# Patient Record
Sex: Male | Born: 1970 | Race: White | Hispanic: No | Marital: Married | Smoking: Never smoker
Health system: Southern US, Community
[De-identification: ages and names within clinical notes are randomized; demographics above are authoritative.]

## PROBLEM LIST (undated history)

## (undated) DIAGNOSIS — I639 Cerebral infarction, unspecified: Secondary | ICD-10-CM

## (undated) DIAGNOSIS — F1010 Alcohol abuse, uncomplicated: Secondary | ICD-10-CM

## (undated) DIAGNOSIS — I619 Nontraumatic intracerebral hemorrhage, unspecified: Secondary | ICD-10-CM

## (undated) DIAGNOSIS — R569 Unspecified convulsions: Secondary | ICD-10-CM

## (undated) DIAGNOSIS — F988 Other specified behavioral and emotional disorders with onset usually occurring in childhood and adolescence: Secondary | ICD-10-CM

## (undated) DIAGNOSIS — F32A Depression, unspecified: Secondary | ICD-10-CM

## (undated) DIAGNOSIS — I10 Essential (primary) hypertension: Secondary | ICD-10-CM

## (undated) HISTORY — DX: Depression, unspecified: F32.A

## (undated) HISTORY — DX: Cerebral infarction, unspecified: I63.9

## (undated) HISTORY — DX: Unspecified convulsions: R56.9

## (undated) HISTORY — DX: Other specified behavioral and emotional disorders with onset usually occurring in childhood and adolescence: F98.8

---

## 1990-02-28 HISTORY — PX: OTHER SURGICAL HISTORY: SHX169

## 1994-02-28 HISTORY — PX: OTHER SURGICAL HISTORY: SHX169

## 2003-08-17 ENCOUNTER — Other Ambulatory Visit: Payer: Self-pay

## 2004-06-19 ENCOUNTER — Emergency Department: Payer: Self-pay | Admitting: Unknown Physician Specialty

## 2006-05-05 ENCOUNTER — Encounter: Payer: Self-pay | Admitting: Internal Medicine

## 2006-05-05 ENCOUNTER — Ambulatory Visit: Payer: Self-pay | Admitting: Internal Medicine

## 2006-05-05 DIAGNOSIS — F988 Other specified behavioral and emotional disorders with onset usually occurring in childhood and adolescence: Secondary | ICD-10-CM | POA: Insufficient documentation

## 2006-06-08 ENCOUNTER — Encounter: Payer: Self-pay | Admitting: Internal Medicine

## 2006-06-08 ENCOUNTER — Ambulatory Visit: Payer: Self-pay | Admitting: Internal Medicine

## 2006-07-03 ENCOUNTER — Telehealth (INDEPENDENT_AMBULATORY_CARE_PROVIDER_SITE_OTHER): Payer: Self-pay | Admitting: *Deleted

## 2006-07-07 ENCOUNTER — Telehealth (INDEPENDENT_AMBULATORY_CARE_PROVIDER_SITE_OTHER): Payer: Self-pay | Admitting: *Deleted

## 2006-08-02 ENCOUNTER — Telehealth (INDEPENDENT_AMBULATORY_CARE_PROVIDER_SITE_OTHER): Payer: Self-pay | Admitting: *Deleted

## 2006-08-28 ENCOUNTER — Telehealth (INDEPENDENT_AMBULATORY_CARE_PROVIDER_SITE_OTHER): Payer: Self-pay | Admitting: *Deleted

## 2006-10-02 ENCOUNTER — Telehealth (INDEPENDENT_AMBULATORY_CARE_PROVIDER_SITE_OTHER): Payer: Self-pay | Admitting: *Deleted

## 2006-10-31 ENCOUNTER — Telehealth (INDEPENDENT_AMBULATORY_CARE_PROVIDER_SITE_OTHER): Payer: Self-pay | Admitting: *Deleted

## 2006-11-28 ENCOUNTER — Telehealth (INDEPENDENT_AMBULATORY_CARE_PROVIDER_SITE_OTHER): Payer: Self-pay | Admitting: *Deleted

## 2006-12-28 ENCOUNTER — Telehealth (INDEPENDENT_AMBULATORY_CARE_PROVIDER_SITE_OTHER): Payer: Self-pay | Admitting: *Deleted

## 2006-12-29 ENCOUNTER — Telehealth (INDEPENDENT_AMBULATORY_CARE_PROVIDER_SITE_OTHER): Payer: Self-pay | Admitting: *Deleted

## 2007-02-12 ENCOUNTER — Telehealth (INDEPENDENT_AMBULATORY_CARE_PROVIDER_SITE_OTHER): Payer: Self-pay | Admitting: *Deleted

## 2007-03-16 ENCOUNTER — Telehealth (INDEPENDENT_AMBULATORY_CARE_PROVIDER_SITE_OTHER): Payer: Self-pay | Admitting: *Deleted

## 2007-04-19 ENCOUNTER — Telehealth (INDEPENDENT_AMBULATORY_CARE_PROVIDER_SITE_OTHER): Payer: Self-pay | Admitting: *Deleted

## 2007-05-02 ENCOUNTER — Encounter (INDEPENDENT_AMBULATORY_CARE_PROVIDER_SITE_OTHER): Payer: Self-pay | Admitting: *Deleted

## 2007-05-15 ENCOUNTER — Ambulatory Visit: Payer: Self-pay | Admitting: Internal Medicine

## 2007-06-18 ENCOUNTER — Telehealth (INDEPENDENT_AMBULATORY_CARE_PROVIDER_SITE_OTHER): Payer: Self-pay | Admitting: *Deleted

## 2007-07-16 ENCOUNTER — Telehealth (INDEPENDENT_AMBULATORY_CARE_PROVIDER_SITE_OTHER): Payer: Self-pay | Admitting: *Deleted

## 2007-08-13 ENCOUNTER — Telehealth (INDEPENDENT_AMBULATORY_CARE_PROVIDER_SITE_OTHER): Payer: Self-pay | Admitting: *Deleted

## 2007-08-20 ENCOUNTER — Telehealth: Payer: Self-pay | Admitting: Internal Medicine

## 2007-09-10 ENCOUNTER — Telehealth (INDEPENDENT_AMBULATORY_CARE_PROVIDER_SITE_OTHER): Payer: Self-pay | Admitting: *Deleted

## 2007-10-11 ENCOUNTER — Telehealth (INDEPENDENT_AMBULATORY_CARE_PROVIDER_SITE_OTHER): Payer: Self-pay | Admitting: *Deleted

## 2007-11-12 ENCOUNTER — Telehealth: Payer: Self-pay | Admitting: Internal Medicine

## 2007-11-16 ENCOUNTER — Ambulatory Visit: Payer: Self-pay | Admitting: Internal Medicine

## 2007-12-12 ENCOUNTER — Telehealth: Payer: Self-pay | Admitting: Internal Medicine

## 2008-01-09 ENCOUNTER — Telehealth: Payer: Self-pay | Admitting: Internal Medicine

## 2008-02-11 ENCOUNTER — Telehealth: Payer: Self-pay | Admitting: Internal Medicine

## 2008-03-11 ENCOUNTER — Telehealth: Payer: Self-pay | Admitting: Internal Medicine

## 2008-04-14 ENCOUNTER — Telehealth: Payer: Self-pay | Admitting: Internal Medicine

## 2008-05-12 ENCOUNTER — Telehealth: Payer: Self-pay | Admitting: Family Medicine

## 2008-07-03 ENCOUNTER — Ambulatory Visit: Payer: Self-pay | Admitting: Internal Medicine

## 2008-07-17 ENCOUNTER — Ambulatory Visit: Payer: Self-pay | Admitting: Family Medicine

## 2008-07-17 ENCOUNTER — Telehealth (INDEPENDENT_AMBULATORY_CARE_PROVIDER_SITE_OTHER): Payer: Self-pay | Admitting: Internal Medicine

## 2008-07-17 DIAGNOSIS — M545 Low back pain: Secondary | ICD-10-CM

## 2008-07-17 LAB — CONVERTED CEMR LAB
Bacteria, UA: 0
Blood in Urine, dipstick: NEGATIVE
Ketones, urine, test strip: NEGATIVE
Nitrite: NEGATIVE
RBC / HPF: 0
Urobilinogen, UA: 0.2
WBC Urine, dipstick: NEGATIVE

## 2008-07-18 ENCOUNTER — Telehealth: Payer: Self-pay | Admitting: Family Medicine

## 2008-07-22 ENCOUNTER — Encounter (INDEPENDENT_AMBULATORY_CARE_PROVIDER_SITE_OTHER): Payer: Self-pay | Admitting: Internal Medicine

## 2008-07-25 ENCOUNTER — Telehealth (INDEPENDENT_AMBULATORY_CARE_PROVIDER_SITE_OTHER): Payer: Self-pay | Admitting: *Deleted

## 2008-08-11 ENCOUNTER — Telehealth: Payer: Self-pay | Admitting: Internal Medicine

## 2008-08-12 ENCOUNTER — Telehealth: Payer: Self-pay | Admitting: *Deleted

## 2008-09-09 ENCOUNTER — Telehealth: Payer: Self-pay | Admitting: Internal Medicine

## 2008-10-14 ENCOUNTER — Telehealth: Payer: Self-pay | Admitting: Internal Medicine

## 2008-11-19 ENCOUNTER — Telehealth: Payer: Self-pay | Admitting: Internal Medicine

## 2008-12-08 ENCOUNTER — Telehealth: Payer: Self-pay | Admitting: Family Medicine

## 2008-12-19 ENCOUNTER — Telehealth: Payer: Self-pay | Admitting: Internal Medicine

## 2009-01-12 ENCOUNTER — Telehealth: Payer: Self-pay | Admitting: Internal Medicine

## 2009-02-13 ENCOUNTER — Telehealth: Payer: Self-pay | Admitting: Internal Medicine

## 2009-03-31 ENCOUNTER — Telehealth: Payer: Self-pay | Admitting: Internal Medicine

## 2009-09-16 ENCOUNTER — Ambulatory Visit: Payer: Self-pay | Admitting: Internal Medicine

## 2009-10-16 ENCOUNTER — Telehealth: Payer: Self-pay | Admitting: Internal Medicine

## 2009-10-19 ENCOUNTER — Telehealth: Payer: Self-pay | Admitting: Internal Medicine

## 2009-11-25 ENCOUNTER — Telehealth: Payer: Self-pay | Admitting: Internal Medicine

## 2009-12-24 ENCOUNTER — Telehealth: Payer: Self-pay | Admitting: Internal Medicine

## 2009-12-29 ENCOUNTER — Telehealth: Payer: Self-pay | Admitting: Internal Medicine

## 2009-12-31 ENCOUNTER — Telehealth: Payer: Self-pay | Admitting: Internal Medicine

## 2010-02-01 ENCOUNTER — Telehealth: Payer: Self-pay | Admitting: Internal Medicine

## 2010-03-26 ENCOUNTER — Ambulatory Visit: Admit: 2010-03-26 | Payer: Self-pay | Admitting: Internal Medicine

## 2010-03-30 NOTE — Progress Notes (Signed)
Summary: adderall   Phone Note Call from Patient Call back at Home Phone [phone]   Caller: Spouse Call For: Cindee Salt MD Summary of Call: Wife tool patient's adderall rx to pharmacy and was told that it is on back order until january. Wife is asking if he can be switched to extended release. Please call patient when ready for pick up.  Initial call taken by: Melody Comas,  December 31, 2009 11:06 AM  Follow-up for Phone Call        Rx written for long acting he should not take this after lunch  they need to bring back the other script Follow-up by: Cindee Salt MD,  December 31, 2009 1:15 PM  Additional Follow-up for Phone Call Additional follow up Details #1::        Spoke with patient's wife and advised results. She will bring back old rx to be destroyed. Additional Follow-up by: Mervin Hack CMA Duncan Dull),  December 31, 2009 3:06 PM    New/Updated Medications: AMPHETAMINE-DEXTROAMPHETAMINE 20 MG XR24H-CAP (AMPHETAMINE-DEXTROAMPHETAMINE) 1-2 tabs daily for attention problems Prescriptions: AMPHETAMINE-DEXTROAMPHETAMINE 20 MG XR24H-CAP (AMPHETAMINE-DEXTROAMPHETAMINE) 1-2 tabs daily for attention problems  #60 x 0   Entered and Authorized by:   Cindee Salt MD   Signed by:   Cindee Salt MD on 12/31/2009   Method used:   Print then Give to Patient   RxID:   [id number]

## 2010-03-30 NOTE — Progress Notes (Signed)
Summary: refill request for adderall  Phone Note Refill Request Call back at Home Phone [phone] Message from:  wife  Refills Requested: Medication #1:  ADDERALL 10 MG  TABS 1 in AM and 1 at lunch as needed Please call when ready.   Initial call taken by: Lowella Petties CMA,  October 16, 2009 1:50 PM  Follow-up for Phone Call        Rx written Follow-up by: Cindee Salt MD,  October 16, 2009 2:11 PM  Additional Follow-up for Phone Call Additional follow up Details #1::        left message on machine that rx ready for pick-up  Additional Follow-up by: Mervin Hack CMA Duncan Dull),  October 16, 2009 2:15 PM    Prescriptions: ADDERALL 10 MG  TABS (AMPHETAMINE-DEXTROAMPHETAMINE) 1 in AM and 1 at lunch as needed  #60 x 0   Entered and Authorized by:   Cindee Salt MD   Signed by:   Cindee Salt MD on 10/16/2009   Method used:   Print then Give to Patient   RxID:   [id number]

## 2010-03-30 NOTE — Progress Notes (Signed)
Summary: Rx Adderall  Phone Note Call from Patient Call back at [phone]   Caller: Patient Call For: Cindee Salt MD Summary of Call: Patient needs a refill on his Adderall and wants to know if you can increase it?  Patient would like it increased because he has started a new job and has been having to double the medication for it to work. Please advise when ready to pickup.  Initial call taken by: Sydell Axon LPN,  October 19, 2009 11:43 AM  Follow-up for Phone Call        I will increase the dose but please have him set up appt in 4-6 weeks to review how he is doing and what dose is right for him in the long run Follow-up by: Cindee Salt MD,  October 19, 2009 1:45 PM  Additional Follow-up for Phone Call Additional follow up Details #1::        Patient notified as instructed by telephone. Patient will schedule an appointment when he comes in to pick the rx up. Additional Follow-up by: Sydell Axon LPN,  October 19, 2009 1:56 PM    New/Updated Medications: ADDERALL 10 MG  TABS (AMPHETAMINE-DEXTROAMPHETAMINE) 1-2 tabs  in AM and 1-2 tabs at lunch as needed Prescriptions: ADDERALL 10 MG  TABS (AMPHETAMINE-DEXTROAMPHETAMINE) 1-2 tabs  in AM and 1-2 tabs at lunch as needed  #120 x 0   Entered and Authorized by:   Cindee Salt MD   Signed by:   Cindee Salt MD on 10/19/2009   Method used:   Print then Give to Patient   RxID:   [id number]

## 2010-03-30 NOTE — Progress Notes (Signed)
Summary: adderall  Phone Note Refill Request Call back at Home Phone [phone] Message from:  Patient on November 25, 2009 9:42 AM  Refills Requested: Medication #1:  ADDERALL 10 MG  TABS 1-2 tabs  in AM and 1-2 tabs at lunch as needed  Method Requested: Pick up at Office Initial call taken by: Melody Comas,  November 25, 2009 9:42 AM  Follow-up for Phone Call        Rx written Follow-up by: Cindee Salt MD,  November 25, 2009 11:26 AM  Additional Follow-up for Phone Call Additional follow up Details #1::        left message on machine that rx ready for pick-up  Additional Follow-up by: DeShannon Smith CMA Duncan Dull),  November 25, 2009 3:08 PM    New/Updated Medications: ADDERALL 10 MG  TABS (AMPHETAMINE-DEXTROAMPHETAMINE) 1-2 tabs  in AM and 1-2 tabs at lunch as needed Prescriptions: ADDERALL 10 MG  TABS (AMPHETAMINE-DEXTROAMPHETAMINE) 1-2 tabs  in AM and 1-2 tabs at lunch as needed  #120 x 0   Entered and Authorized by:   Cindee Salt MD   Signed by:   Cindee Salt MD on 11/25/2009   Method used:   Print then Give to Patient   RxID:   [id number]

## 2010-03-30 NOTE — Progress Notes (Signed)
Summary: Appts & rx  Phone Note Outgoing Call   Call placed by: Mervin Hack CMA Duncan Dull),  December 29, 2009 11:00 AM Summary of Call: pt was told on 10/19/2009 to schedule a follow-up vist for medication, pt did not. Pt called for rx on 11/25/2009 and never picked up rx, pt called on 12/24/2009 for rx which was picked up today, on the 12/24/2009 rx I wrote a note for pt to schedule appt, pt did not schedule appt. Rx dated 11/25/2009 was shredded, Dr.Letvak was advised. Initial call taken by: Mervin Hack CMA Duncan Dull),  December 29, 2009 11:01 AM  Follow-up for Phone Call        Please make sure he is doing okay on the higher dose (if he is even taking the higher dose) Due for follow up in January if everything is okay Follow-up by: Cindee Salt MD,  December 29, 2009 12:28 PM  Additional Follow-up for Phone Call Additional follow up Details #1::        left message on machine with results, advised pt of follow-up appt Additional Follow-up by: Mervin Hack CMA Duncan Dull),  December 29, 2009 3:43 PM

## 2010-03-30 NOTE — Progress Notes (Signed)
Summary: refill request for adderall  Phone Note Refill Request Call back at Home Phone [phone] Message from:  wife  Refills Requested: Medication #1:  ADDERALL 10 MG  TABS 1-2 tabs  in AM and 1-2 tabs at lunch as needed Please call when ready, XR is ok since adderall is still on backorder.  Initial call taken by: Lowella Petties CMA, AAMA,  February 01, 2010 11:17 AM  Follow-up for Phone Call        Rx written Follow-up by: Cindee Salt MD,  February 01, 2010 1:42 PM  Additional Follow-up for Phone Call Additional follow up Details #1::        left message on machine that rx ready for pick-up  Additional Follow-up by: DeShannon Smith CMA Duncan Dull),  February 01, 2010 2:28 PM    Prescriptions: AMPHETAMINE-DEXTROAMPHETAMINE 20 MG XR24H-CAP (AMPHETAMINE-DEXTROAMPHETAMINE) 1-2 tabs daily for attention problems  #60 x 0   Entered and Authorized by:   Cindee Salt MD   Signed by:   Cindee Salt MD on 02/01/2010   Method used:   Print then Give to Patient   RxID:   [id number]

## 2010-03-30 NOTE — Assessment & Plan Note (Signed)
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  Past Medical History: Reviewed history from 05/15/2007 and no changes required. ADHD  Past Surgical History: Reviewed history from 05/05/2006 and no changes required. 1996--R 1st toe reattached 1992-R hand tendon reattached  Family History: Reviewed history from 05/05/2006 and no changes required. Dad--died MI at 65 Mom-66.  Had stent at age 5 HTN in Dad No DM No cancer--specifically no colon or prostate  Social History: Reviewed history from 05/15/2007 and no changes required. Together with fiancee for 2 years.  Live together. 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   Psych:  normally interactive, good eye contact, not anxious appearing, and not depressed appearing.     Impression & Recommendations:  Problem # 1:  ATTENTION DEFICIT DISORDER, ADULT  (ICD-314.00) Assessment Comment Only off for a while--mostly since no insurance has noted a difference so will restart generally only uses on work days  Complete Medication List: 1)  Multivitamins Tabs (Multiple vitamin) .... Take one by mouth daily 2)  Adderall 10 Mg Tabs (Amphetamine-dextroamphetamine) .Marland Kitchen.. 1 in am and 1 at lunch as needed 3)  Vitamin C Cr 1000 Mg Cr-tabs (Ascorbic acid) .Marland Kitchen.. 1 daily by mouth 4)  Advil 200 Mg Tabs (Ibuprofen) .... Two tablets daily  Patient Instructions: 1)  Please schedule a follow-up appointment in 6 months for physical Prescriptions: ADDERALL 10 MG  TABS (AMPHETAMINE-DEXTROAMPHETAMINE) 1 in AM and 1 at lunch as needed  #60 x 0   Entered and Authorized by:   Cindee Salt MD   Signed by:   Cindee Salt MD on 09/16/2009   Method used:   Print then Give to Patient   RxID:   [id number]   Current Allergies (reviewed today): No known allergies

## 2010-03-30 NOTE — Progress Notes (Signed)
Summary: adderall   Phone Note Refill Request Message from:  Fax from Pharmacy on December 24, 2009 3:14 PM  Refills Requested: Medication #1:  ADDERALL 10 MG  TABS 1-2 tabs  in AM and 1-2 tabs at lunch as needed   Last Refilled: May 12, 1970  Method Requested: Pick up at Office Initial call taken by: Melody Comas,  December 24, 2009 3:14 PM  Follow-up for Phone Call        Rx written Follow-up by: Cindee Salt MD,  December 24, 2009 5:01 PM  Additional Follow-up for Phone Call Additional follow up Details #1::        Spoke with patient and advised rx ready for pick-up  Additional Follow-up by: Mervin Hack CMA Duncan Dull),  December 25, 2009 8:45 AM    Prescriptions: ADDERALL 10 MG  TABS (AMPHETAMINE-DEXTROAMPHETAMINE) 1-2 tabs  in AM and 1-2 tabs at lunch as needed  #120 x 0   Entered and Authorized by:   Cindee Salt MD   Signed by:   Cindee Salt MD on 12/24/2009   Method used:   Print then Give to Patient   RxID:   [id number]

## 2010-03-30 NOTE — Progress Notes (Signed)
Summary: Adderall 10mg   Phone Note Refill Request Call back at [phone] Message from:  Patient's mom Neysa Bonito on March 31, 2009 3:47 PM  Refills Requested: Medication #1:  ADDERALL 10 MG  TABS 1 in AM and 1 at lunch as needed Pt's mom request written rx for Adderall 10mg . Please call pt's mom when ready for pick up.Please advise.    Method Requested: Pick up at Office Initial call taken by: Lewanda Rife LPN,  March 31, 2009 3:48 PM  Follow-up for Phone Call        Rx written he needs to set up appt before next Rx can be done overdue for appt. Was due for physical in November Follow-up by: Cindee Salt MD,  April 01, 2009 12:06 PM  Additional Follow-up for Phone Call Additional follow up Details #1::        Spoke with [facility] and advised results.  Additional Follow-up by: Mervin Hack CMA Duncan Dull),  April 01, 2009 1:31 PM    Prescriptions: ADDERALL 10 MG  TABS (AMPHETAMINE-DEXTROAMPHETAMINE) 1 in AM and 1 at lunch as needed  #60 x 0   Entered and Authorized by:   Cindee Salt MD   Signed by:   Cindee Salt MD on 04/01/2009   Method used:   Print then Give to Patient   RxID:   [id number]

## 2010-04-22 ENCOUNTER — Telehealth: Payer: Self-pay | Admitting: Internal Medicine

## 2010-04-27 ENCOUNTER — Telehealth: Payer: Self-pay | Admitting: Internal Medicine

## 2010-04-27 NOTE — Progress Notes (Signed)
Summary: refill request for adderall  Phone Note Refill Request Call back at Work Phone [phone] Message from:  Patient  Refills Requested: Medication #1:  AMPHETAMINE-DEXTROAMPHETAMINE 20 MG XR24H-CAP 1-2 tabs daily for attention problems. Please call pt when ready.  Initial call taken by: Lowella Petties CMA, AAMA,  April 22, 2010 4:23 PM  Follow-up for Phone Call        Rx written He is well overdue for the planned physical He must schedule Follow-up by: Cindee Salt MD,  April 23, 2010 8:48 AM  Additional Follow-up for Phone Call Additional follow up Details #1::        Spoke with patient and advised rx ready for pick-up , advised pt that he needs to schedule physical, pt stated he will do so when he picks up his rx. I also wrote on the envelope to alert front desk that pt needs to schedule appt. Additional Follow-up by: Mervin Hack CMA Duncan Dull),  April 23, 2010 9:55 AM    Prescriptions: AMPHETAMINE-DEXTROAMPHETAMINE 20 MG XR24H-CAP (AMPHETAMINE-DEXTROAMPHETAMINE) 1-2 tabs daily for attention problems  #60 x 0   Entered and Authorized by:   Cindee Salt MD   Signed by:   Cindee Salt MD on 04/23/2010   Method used:   Print then Give to Patient   RxID:   [id number]

## 2010-04-28 ENCOUNTER — Encounter: Payer: Self-pay | Admitting: Internal Medicine

## 2010-05-06 NOTE — Progress Notes (Signed)
Summary: Canceled appt  Phone Note Call from Patient Call back at Home Phone [phone]   Caller: Patient Call For: Cindee Salt MD Summary of Call: patient called and canceled appt for 04/28/2010 and didn't re-schedule, pt got refill, made appt then canceled appt. Please advise. Initial call taken by: Mervin Hack CMA Duncan Dull),  April 27, 2010 5:25 PM  Follow-up for Phone Call        we will not be able to refill med again until he is seen in the office then Follow-up by: Cindee Salt MD,  April 28, 2010 7:39 AM  Additional Follow-up for Phone Call Additional follow up Details #1::        ok Additional Follow-up by: Mervin Hack CMA Duncan Dull),  April 28, 2010 9:20 AM

## 2011-04-15 ENCOUNTER — Emergency Department: Payer: Self-pay | Admitting: *Deleted

## 2011-04-15 LAB — CK TOTAL AND CKMB (NOT AT ARMC)
CK, Total: 285 U/L — ABNORMAL HIGH (ref 35–232)
CK-MB: 1.4 ng/mL (ref 0.5–3.6)

## 2011-04-15 LAB — URINALYSIS, COMPLETE
Bacteria: NONE SEEN
Bilirubin,UR: NEGATIVE
Blood: NEGATIVE
Glucose,UR: NEGATIVE mg/dL (ref 0–75)
Ketone: NEGATIVE
Leukocyte Esterase: NEGATIVE
Nitrite: NEGATIVE
Ph: 7 (ref 4.5–8.0)
Protein: NEGATIVE
RBC,UR: NONE SEEN /HPF (ref 0–5)
Specific Gravity: 1.002 (ref 1.003–1.030)
Squamous Epithelial: NONE SEEN
WBC UR: NONE SEEN /HPF (ref 0–5)

## 2011-04-15 LAB — COMPREHENSIVE METABOLIC PANEL
Albumin: 4.2 g/dL (ref 3.4–5.0)
Alkaline Phosphatase: 72 U/L (ref 50–136)
Anion Gap: 11 (ref 7–16)
BUN: 7 mg/dL (ref 7–18)
Calcium, Total: 9.3 mg/dL (ref 8.5–10.1)
Co2: 29 mmol/L (ref 21–32)
EGFR (Non-African Amer.): >60
Glucose: 100 mg/dL — ABNORMAL HIGH (ref 65–99)
Potassium: 3.3 mmol/L — ABNORMAL LOW (ref 3.5–5.1)
SGOT(AST): 56 U/L — ABNORMAL HIGH (ref 15–37)
Total Protein: 8.2 g/dL (ref 6.4–8.2)

## 2011-04-15 LAB — TROPONIN I: Troponin-I: <0.02 ng/mL

## 2011-04-15 LAB — CBC
HCT: 49.9 % (ref 40.0–52.0)
HGB: 17 g/dL (ref 13.0–18.0)
MCH: 33.7 pg (ref 26.0–34.0)
MCHC: 34 g/dL (ref 32.0–36.0)
MCV: 99 fL (ref 80–100)
Platelet: 226 x10 3/mm 3 (ref 150–440)
RBC: 5.03 x10 6/mm 3 (ref 4.40–5.90)
RDW: 11.9 % (ref 11.5–14.5)
WBC: 7.8 x10 3/mm 3 (ref 3.8–10.6)

## 2011-04-15 LAB — LIPASE, BLOOD: Lipase: 130 U/L (ref 73–393)

## 2011-04-15 LAB — ETHANOL
Ethanol %: 0.22 % — ABNORMAL HIGH (ref 0.000–0.080)
Ethanol: 220 mg/dL

## 2011-08-23 ENCOUNTER — Ambulatory Visit (INDEPENDENT_AMBULATORY_CARE_PROVIDER_SITE_OTHER): Payer: BC Managed Care – PPO | Admitting: Family Medicine

## 2011-08-23 ENCOUNTER — Encounter: Payer: Self-pay | Admitting: Internal Medicine

## 2011-08-23 ENCOUNTER — Encounter: Payer: Self-pay | Admitting: Family Medicine

## 2011-08-23 VITALS — BP 160/115 | HR 96 | Temp 98.6°F | Wt 175.0 lb

## 2011-08-23 DIAGNOSIS — F4320 Adjustment disorder, unspecified: Secondary | ICD-10-CM | POA: Insufficient documentation

## 2011-08-23 DIAGNOSIS — R030 Elevated blood-pressure reading, without diagnosis of hypertension: Secondary | ICD-10-CM

## 2011-08-23 DIAGNOSIS — I10 Essential (primary) hypertension: Secondary | ICD-10-CM | POA: Insufficient documentation

## 2011-08-23 MED ORDER — TRAZODONE HCL 50 MG PO TABS: 25.0000 mg | ORAL_TABLET | Freq: Every evening | ORAL | Status: DC | PRN

## 2011-08-23 NOTE — Patient Instructions (Addendum)
Your blood pressure is high today!  Keep an eye on this at home.  If consistently >140/90, let us know. Return in 2-3 weeks for follow up on blood pressure. Start trazodone for sleep as needed at night. I agree that best option is finding new job if able.

## 2011-08-23 NOTE — Assessment & Plan Note (Addendum)
To stressful job at work. Pt's main concern today is trouble sleeping as feels this is affecting other aspects of life. Given EtOH use, avoid benzo, ambien. Treat with trazodone prn insomnia.  Update if not achieving better sleep. Discussed EtOH use and negative impact on stress and on sleep. rtc 3 wks for f/u. PHQ9 = 8/27, somewhat to very difficult to function GAD7 = 10/21

## 2011-08-23 NOTE — Assessment & Plan Note (Signed)
Normal in past. In setting of recent increase in stress. Elevated today, advised to keep track of bp at home (able to do this) and update [place] if consistently >!40/90.  Regardless, return in 3 wks for recheck, if staying elevated, start bp med.  Will likely recommend stop adderall.

## 2011-08-23 NOTE — Progress Notes (Signed)
  Subjective:    Patient ID: Levi Mejia, male    DOB: October 13, 1970, 41 y.o.   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  Poor relationship with boss.  No stressful events recently endorsed.  Comes in today because of worsening mood issues - normally laid back guy but noticing is more irritable, yelling at ppl, filter gone, losing temper at work.  Lability mostly towards boss but spilling into rest of life (ie to GF, others).  Hasn't been able to sleep more than 1-2 hours for last 3-4 days 2/2 stress.  Tylenol PM makes him more jittery.  Alternating between feelings of depression and anxiety.  Appetite down.  Focus decreased.  Energy level down.  Slower.  No SI/HI.    H/o ADHD, doesn't take adderall on weekends.  Only on weekdays.  This week hasn't taken.  Recently started seeing [facility] as closer to work.  Adderall intially prescribed by Dr. Elbert Ewings, but now prescribed by doc at [facility].  Recently increased EtOH - goes through about 1/2 gallon of tequila in 1 wk.  This peak was 1 mo ago.  Trying to back down, down to 1/5th per week.  No wine or beer.  No rec drugs.  Nonsmoker.  Has decided will quit job 2/2 increase in stress level.  Actually has interviews scheduled for later this week.  Doesn't think will have trouble finding new job.  Recently had blood work done 3-4 mo ago.  Thinks all normal except mildly elevated cholesterol levels.  No fevers/chills, weight changes, abd pain, chest pain, sob, palpitations.  No fmhx mood disorders other than depression.  Denies manic sxs.    No h/o HTN in past.  Attributes to recent work stress. BP Readings from Last 3 Encounters:  08/23/11 150/110  09/16/09 124/90  07/17/08 136/94    Review of Systems Per HPI    Objective:   Physical Exam  Nursing note and vitals reviewed. Constitutional: He appears well-developed and well-nourished. No distress.  HENT:  Head:  Normocephalic and atraumatic.  Mouth/Throat: Oropharynx is clear and moist. No oropharyngeal exudate.  Eyes: Conjunctivae and EOM are normal. Pupils are equal, round, and reactive to light. No scleral icterus.  Cardiovascular: Normal rate, regular rhythm, normal heart sounds and intact distal pulses.   No murmur heard. Pulmonary/Chest: Effort normal and breath sounds normal. No respiratory distress. He has no wheezes. He has no rales.  Musculoskeletal: He exhibits no edema.  Skin: Skin is warm and dry. No rash noted.  Psychiatric: He has a normal mood and affect. His behavior is normal. Judgment and thought content normal.       Assessment & Plan:

## 2011-09-14 ENCOUNTER — Other Ambulatory Visit: Payer: Self-pay | Admitting: *Deleted

## 2011-09-14 NOTE — Telephone Encounter (Signed)
He would need appt and I would have to have notes from the [facility] MD prescribing the med With his BP up at last visit, we probably shouldn't continue it anyway

## 2011-09-14 NOTE — Telephone Encounter (Signed)
Pt's girlfriend was in today for OV with pt's daughter and asked if patient would need to be seen to get refill of Adderall? I left message for patient to return my call. Per last OV with Dr.G patient is seeing a physician at [facility] who is prescribing the adderall, also need to know what dose pt is taking. Please advise

## 2011-09-14 NOTE — Telephone Encounter (Signed)
Spoke with patient and he scheduled appt 09/28/11

## 2011-09-28 ENCOUNTER — Ambulatory Visit: Payer: BC Managed Care – PPO | Admitting: Internal Medicine

## 2011-09-28 DIAGNOSIS — Z0289 Encounter for other administrative examinations: Secondary | ICD-10-CM

## 2011-10-07 ENCOUNTER — Ambulatory Visit (INDEPENDENT_AMBULATORY_CARE_PROVIDER_SITE_OTHER): Payer: BC Managed Care – PPO | Admitting: Internal Medicine

## 2011-10-07 ENCOUNTER — Encounter: Payer: Self-pay | Admitting: Internal Medicine

## 2011-10-07 VITALS — BP 160/98 | HR 94 | Temp 97.9°F | Wt 171.0 lb

## 2011-10-07 DIAGNOSIS — K529 Noninfective gastroenteritis and colitis, unspecified: Secondary | ICD-10-CM

## 2011-10-07 DIAGNOSIS — K5289 Other specified noninfective gastroenteritis and colitis: Secondary | ICD-10-CM

## 2011-10-07 DIAGNOSIS — F4320 Adjustment disorder, unspecified: Secondary | ICD-10-CM

## 2011-10-07 DIAGNOSIS — R030 Elevated blood-pressure reading, without diagnosis of hypertension: Secondary | ICD-10-CM

## 2011-10-07 DIAGNOSIS — IMO0001 Reserved for inherently not codable concepts without codable children: Secondary | ICD-10-CM

## 2011-10-07 NOTE — Addendum Note (Signed)
Addended by: Tillman Abide I on: 10/07/2011 08:58 AM   Modules accepted: Orders

## 2011-10-07 NOTE — Assessment & Plan Note (Signed)
Improved with quitting the job No meds needed Sleeping better without meds

## 2011-10-07 NOTE — Assessment & Plan Note (Signed)
Seems to be typical viral infection Diarrhea and anorexia No sig pain or fever Is improving so supportive care only

## 2011-10-07 NOTE — Assessment & Plan Note (Signed)
BP Readings from Last 3 Encounters:  10/07/11 160/98  08/23/11 160/115  09/16/09 124/90   Some better now Will hold off on Rx See back in a few months when everything has calmed down---Rx if still up then

## 2011-10-07 NOTE — Progress Notes (Signed)
  Subjective:    Patient ID: Levi Mejia, male    DOB: 1970-11-01, 41 y.o.   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  Hasn't taken adderall for the past month Has taken 2-3 weeks off to "flush out mentally"  No chest pain but has pressure from abdominal bloating Illness started 3 days ago Vomited first day of illness Drinking okay but no appetite Lots of diarrhea (daughter has same thing) No fever--but has awoken in cold sweat No sig abd pain---just a little uncomfortable  Current Outpatient Prescriptions on File Prior to Visit  Medication Sig Dispense Refill  . Ascorbic Acid (VITAMIN C) 1000 MG tablet Take 1,000 mg by mouth daily.      Marland Kitchen ibuprofen (ADVIL,MOTRIN) 200 MG tablet Take 200 mg by mouth every 6 (six) hours as needed.      . Multiple Vitamin (MULTIVITAMIN) tablet Take 1 tablet by mouth daily.        No Known Allergies  Past Medical History  Diagnosis Date  . Attention deficit disorder without mention of hyperactivity     Past Surgical History  Procedure Date  . Toe reattachment 1996    Right 1st toe  . Hand tendon reattachment 1992    Right hand    Family History  Problem Relation Age of Onset  . Heart attack Father 38  . Coronary artery disease Mother 39    Stent  . Hypertension Father   . Diabetes Neg Hx   . Prostate cancer Neg Hx   . Colon cancer Neg Hx     History   Social History  . Marital Status: Married    Spouse Name: N/A    Number of Children: 1  . Years of Education: N/A   Occupational History  . Mechanic    Social History Main Topics  . Smoking status: Never Smoker   . Smokeless tobacco: Never Used  .  Alcohol Use: Yes     Regular  . Drug Use: No  . Sexually Active: Not on file   Other Topics Concern  . 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 No distress.  Neck: Normal range of motion. Neck supple. No thyromegaly present.  Cardiovascular: Normal rate, regular rhythm and normal heart sounds.  Exam reveals no gallop.   No murmur heard. Pulmonary/Chest: Effort normal and breath sounds normal. No respiratory distress. He has no wheezes. He has no rales.  Abdominal: Soft. He exhibits no mass. There is no tenderness. There is no rebound and no guarding.       Decreased breath sounds but present  Musculoskeletal: He exhibits no edema and no tenderness.  Lymphadenopathy:    He has no cervical adenopathy.  Psychiatric: He has a normal mood and affect. His behavior is normal. Thought content normal.          Assessment & Plan:

## 2011-11-21 ENCOUNTER — Emergency Department: Payer: Self-pay | Admitting: Emergency Medicine

## 2011-11-21 LAB — PROTIME-INR
INR: 1
Prothrombin Time: 13.2 secs (ref 11.5–14.7)

## 2011-11-21 LAB — DRUG SCREEN, URINE
Amphetamines, Ur Screen: NEGATIVE (ref ?–1000)
Barbiturates, Ur Screen: NEGATIVE (ref ?–200)
Benzodiazepine, Ur Scrn: NEGATIVE (ref ?–200)
Methadone, Ur Screen: NEGATIVE (ref ?–300)
Opiate, Ur Screen: NEGATIVE (ref ?–300)
Tricyclic, Ur Screen: NEGATIVE (ref ?–1000)

## 2011-11-21 LAB — URINALYSIS, COMPLETE
Bacteria: NONE SEEN
Glucose,UR: NEGATIVE mg/dL (ref 0–75)
Ketone: NEGATIVE
Nitrite: NEGATIVE
RBC,UR: <1 /HPF (ref 0–5)
Specific Gravity: 1.016 (ref 1.003–1.030)
WBC UR: 1 /HPF (ref 0–5)

## 2011-11-21 LAB — ETHANOL
Ethanol %: 0.128 % — ABNORMAL HIGH (ref 0.000–0.080)
Ethanol: 128 mg/dL

## 2011-11-21 LAB — COMPREHENSIVE METABOLIC PANEL
Alkaline Phosphatase: 93 U/L (ref 50–136)
BUN: 4 mg/dL — ABNORMAL LOW (ref 7–18)
Bilirubin,Total: 1 mg/dL (ref 0.2–1.0)
Chloride: 100 mmol/L (ref 98–107)
EGFR (African American): >60
Glucose: 119 mg/dL — ABNORMAL HIGH (ref 65–99)
Osmolality: 277 (ref 275–301)
SGPT (ALT): 48 U/L (ref 12–78)
Sodium: 140 mmol/L (ref 136–145)
Total Protein: 7.6 g/dL (ref 6.4–8.2)

## 2011-11-21 LAB — GASTROCCULT (ARMC)
Occult Blood, Gastric: NEGATIVE
Ph, Gastric: 3 (ref 1–3)

## 2011-11-21 LAB — LIPASE, BLOOD: Lipase: 136 U/L (ref 73–393)

## 2011-11-21 LAB — CBC
HCT: 46 % (ref 40.0–52.0)
MCHC: 35.6 g/dL (ref 32.0–36.0)
Platelet: 226 10*3/uL (ref 150–440)
RDW: 13.5 % (ref 11.5–14.5)

## 2011-11-21 LAB — APTT: Activated PTT: 28.8 secs (ref 23.6–35.9)

## 2011-11-25 ENCOUNTER — Other Ambulatory Visit: Payer: Self-pay | Admitting: Internal Medicine

## 2011-11-25 ENCOUNTER — Other Ambulatory Visit: Payer: Self-pay

## 2011-11-25 MED ORDER — AMPHETAMINE-DEXTROAMPHET ER 20 MG PO CP24: 20.0000 mg | ORAL_CAPSULE | Freq: Every day | ORAL | Status: DC

## 2011-11-25 NOTE — Telephone Encounter (Signed)
Rx written  Would just start with extended release of 20mg  daily Set up appt about 1 month to see how he is doing and to decide about ongoing Rx  Confirm that he does want to use this--not just his wife

## 2011-11-25 NOTE — Telephone Encounter (Signed)
The pt's wife called the triage line and is hoping to get a refill of Adderall.  He is hoping to resume taking this rx.  Thanks!

## 2011-11-25 NOTE — Telephone Encounter (Signed)
Christie request rx Adderall. Call when ready for pick up.

## 2011-11-25 NOTE — Telephone Encounter (Signed)
Spoke with patient's wife and advised results, she confirmed that pt does want this rx, tried calling pt but it's her number

## 2011-11-25 NOTE — Telephone Encounter (Signed)
This was done See phone note

## 2011-11-28 ENCOUNTER — Other Ambulatory Visit: Payer: Self-pay | Admitting: Internal Medicine

## 2011-11-29 ENCOUNTER — Telehealth: Payer: Self-pay | Admitting: *Deleted

## 2011-11-29 MED ORDER — AMPHETAMINE-DEXTROAMPHETAMINE 20 MG PO TABS: 20.0000 mg | ORAL_TABLET | Freq: Every day | ORAL | Status: DC

## 2011-11-29 NOTE — Telephone Encounter (Signed)
Spoke with patient and advised rx ready for pick-up and it will be at the front desk.  

## 2011-11-29 NOTE — Telephone Encounter (Signed)
Have him bring the other Rx to destroy

## 2011-11-29 NOTE — Telephone Encounter (Signed)
Patient states the adderall XR will cost $179.00 and he would like the regular 20mg . Please advise

## 2012-01-02 ENCOUNTER — Other Ambulatory Visit: Payer: Self-pay

## 2012-01-02 NOTE — Telephone Encounter (Signed)
[name] left v/m requesting rx Adderall. Call when ready for pick up. Pt has appt 01/09/12.

## 2012-01-03 MED ORDER — AMPHETAMINE-DEXTROAMPHETAMINE 20 MG PO TABS: 20.0000 mg | ORAL_TABLET | Freq: Every day | ORAL | Status: DC

## 2012-01-03 NOTE — Telephone Encounter (Signed)
Spoke with patient and advised rx ready for pick-up and it will be at the front desk.  

## 2012-01-09 ENCOUNTER — Ambulatory Visit: Payer: BC Managed Care – PPO | Admitting: Internal Medicine

## 2012-01-09 DIAGNOSIS — Z0289 Encounter for other administrative examinations: Secondary | ICD-10-CM

## 2012-01-25 ENCOUNTER — Other Ambulatory Visit: Payer: Self-pay | Admitting: Internal Medicine

## 2014-03-25 ENCOUNTER — Emergency Department: Payer: Self-pay | Admitting: Emergency Medicine

## 2014-03-25 LAB — URINALYSIS, COMPLETE
BLOOD: NEGATIVE
Bacteria: NONE SEEN
Bilirubin,UR: NEGATIVE
Glucose,UR: NEGATIVE mg/dL (ref 0–75)
Leukocyte Esterase: NEGATIVE
NITRITE: NEGATIVE
PROTEIN: NEGATIVE
Ph: 6 (ref 4.5–8.0)
RBC,UR: NONE SEEN /HPF (ref 0–5)
Squamous Epithelial: NONE SEEN
WBC UR: NONE SEEN /HPF (ref 0–5)

## 2014-03-25 LAB — CBC
HCT: 52.8 % — ABNORMAL HIGH (ref 40.0–52.0)
HGB: 18.1 g/dL — ABNORMAL HIGH (ref 13.0–18.0)
MCH: 32.4 pg (ref 26.0–34.0)
MCHC: 34.2 g/dL (ref 32.0–36.0)
MCV: 95 fL (ref 80–100)
Platelet: 290 10*3/uL (ref 150–440)
RBC: 5.58 10*6/uL (ref 4.40–5.90)
RDW: 13.4 % (ref 11.5–14.5)
WBC: 14.7 10*3/uL — ABNORMAL HIGH (ref 3.8–10.6)

## 2014-03-25 LAB — COMPREHENSIVE METABOLIC PANEL
ALBUMIN: 4.6 g/dL (ref 3.4–5.0)
ALT: 46 U/L (ref 14–63)
Alkaline Phosphatase: 81 U/L (ref 46–116)
Anion Gap: 11 (ref 7–16)
BUN: 6 mg/dL — ABNORMAL LOW (ref 7–18)
Bilirubin,Total: 1.1 mg/dL — ABNORMAL HIGH (ref 0.2–1.0)
Calcium, Total: 9.4 mg/dL (ref 8.5–10.1)
Chloride: 96 mmol/L — ABNORMAL LOW (ref 98–107)
Co2: 26 mmol/L (ref 21–32)
Creatinine: 1.17 mg/dL (ref 0.60–1.30)
EGFR (African American): >60
EGFR (Non-African Amer.): >60
Glucose: 124 mg/dL — ABNORMAL HIGH (ref 65–99)
Osmolality: 265 (ref 275–301)
Potassium: 3.2 mmol/L — ABNORMAL LOW (ref 3.5–5.1)
SGOT(AST): 44 U/L — ABNORMAL HIGH (ref 15–37)
Sodium: 133 mmol/L — ABNORMAL LOW (ref 136–145)
TOTAL PROTEIN: 8.5 g/dL — AB (ref 6.4–8.2)

## 2014-03-25 LAB — LIPASE, BLOOD: LIPASE: 126 U/L (ref 73–393)

## 2016-09-30 ENCOUNTER — Encounter: Payer: Self-pay | Admitting: Internal Medicine

## 2016-09-30 ENCOUNTER — Encounter (INDEPENDENT_AMBULATORY_CARE_PROVIDER_SITE_OTHER): Payer: Self-pay

## 2016-09-30 ENCOUNTER — Ambulatory Visit (INDEPENDENT_AMBULATORY_CARE_PROVIDER_SITE_OTHER): Payer: 59 | Admitting: Internal Medicine

## 2016-09-30 VITALS — BP 142/96 | HR 85 | Temp 98.0°F | Ht 69.5 in | Wt 166.0 lb

## 2016-09-30 DIAGNOSIS — G479 Sleep disorder, unspecified: Secondary | ICD-10-CM | POA: Diagnosis not present

## 2016-09-30 DIAGNOSIS — I10 Essential (primary) hypertension: Secondary | ICD-10-CM

## 2016-09-30 DIAGNOSIS — Z0001 Encounter for general adult medical examination with abnormal findings: Secondary | ICD-10-CM | POA: Diagnosis not present

## 2016-09-30 DIAGNOSIS — Z0000 Encounter for general adult medical examination without abnormal findings: Secondary | ICD-10-CM

## 2016-09-30 LAB — CBC WITH DIFFERENTIAL/PLATELET
Basophils Absolute: 0.1 10*3/uL (ref 0.0–0.1)
Basophils Relative: 2.3 % (ref 0.0–3.0)
EOS ABS: 0.4 10*3/uL (ref 0.0–0.7)
Eosinophils Relative: 6.9 % — ABNORMAL HIGH (ref 0.0–5.0)
HEMATOCRIT: 46.3 % (ref 39.0–52.0)
HEMOGLOBIN: 16 g/dL (ref 13.0–17.0)
Lymphocytes Relative: 26.4 % (ref 12.0–46.0)
Lymphs Abs: 1.5 10*3/uL (ref 0.7–4.0)
MCHC: 34.6 g/dL (ref 30.0–36.0)
MCV: 97.9 fl (ref 78.0–100.0)
MONO ABS: 0.7 10*3/uL (ref 0.1–1.0)
Monocytes Relative: 11.6 % (ref 3.0–12.0)
NEUTROS ABS: 3 10*3/uL (ref 1.4–7.7)
Neutrophils Relative %: 52.8 % (ref 43.0–77.0)
Platelets: 175 10*3/uL (ref 150.0–400.0)
RBC: 4.73 Mil/uL (ref 4.22–5.81)
RDW: 12.7 % (ref 11.5–15.5)
WBC: 5.7 10*3/uL (ref 4.0–10.5)

## 2016-09-30 LAB — COMPREHENSIVE METABOLIC PANEL
ALT: 26 U/L (ref 0–53)
AST: 40 U/L — AB (ref 0–37)
Albumin: 4.6 g/dL (ref 3.5–5.2)
Alkaline Phosphatase: 61 U/L (ref 39–117)
BUN: 11 mg/dL (ref 6–23)
CHLORIDE: 99 meq/L (ref 96–112)
CO2: 31 meq/L (ref 19–32)
CREATININE: 1.03 mg/dL (ref 0.40–1.50)
Calcium: 9.1 mg/dL (ref 8.4–10.5)
GFR: 82.61 mL/min (ref >60.00)
Glucose, Bld: 95 mg/dL (ref 70–99)
Potassium: 3.7 mEq/L (ref 3.5–5.1)
Sodium: 138 mEq/L (ref 135–145)
Total Bilirubin: 0.6 mg/dL (ref 0.2–1.2)
Total Protein: 7.8 g/dL (ref 6.0–8.3)

## 2016-09-30 LAB — LIPID PANEL
CHOL/HDL RATIO: 3
Cholesterol: 227 mg/dL — ABNORMAL HIGH (ref 0–200)
HDL: 67.2 mg/dL (ref >39.00)
LDL Cholesterol: 121 mg/dL — ABNORMAL HIGH (ref 0–99)
NONHDL: 160.22
Triglycerides: 198 mg/dL — ABNORMAL HIGH (ref 0.0–149.0)
VLDL: 39.6 mg/dL (ref 0.0–40.0)

## 2016-09-30 LAB — T4, FREE: FREE T4: 0.63 ng/dL (ref 0.60–1.60)

## 2016-09-30 NOTE — Assessment & Plan Note (Signed)
Mild elevation May be related to sleep problems DASH eating plan Will consider meds at next visit

## 2016-09-30 NOTE — Progress Notes (Signed)
Subjective:    Patient ID: Levi Mejia, male    DOB: 03/01/1970, 46 y.o.   MRN: [id number]  HPI Here to reestablish and for physical Generally doing okay over the past 5 years No regular exercise but active at work  Left shoulder pain--chronic Aggravating Doesn't limit him at work Uses ibuprofen 400mg  bid--that helps  Feels tired all the time Only sleeps 5-6 hours at night---- initiates 9PM--up at midnight, then gets another 3 hours later Tylenol PM not much help No snoring or known apnea  Doesn't check BP  Current Outpatient Prescriptions on File Prior to Visit  Medication Sig Dispense Refill  . Ascorbic Acid (VITAMIN C) 1000 MG tablet Take 1,000 mg by mouth daily.    Marland Kitchen. ibuprofen (ADVIL,MOTRIN) 200 MG tablet Take 200 mg by mouth every 6 (six) hours as needed.    . Multiple Vitamin (MULTIVITAMIN) tablet Take 1 tablet by mouth daily.     No current facility-administered medications on file prior to visit.     No Known Allergies  Past Medical History:  Diagnosis Date  . Attention deficit disorder without mention of hyperactivity     Past Surgical History:  Procedure Laterality Date  . Hand tendon reattachment  1992   Right hand  . toe reattachment  1996   Right 1st toe    Family History  Problem Relation Age of Onset  . Heart attack Father 155  . Hypertension Father   . Coronary artery disease Mother [id number]       Stent  . Cancer Mother        Bone  . Hypertension Mother   . Hypertension Brother   . Diabetes Neg Hx   . Prostate cancer Neg Hx   . Colon cancer Neg Hx   . Heart disease Neg Hx     Social History   Social History  . Marital status: Divorced    Spouse name: N/A  . Number of children: 1  . Years of education: N/A   Occupational History  . Mechanic     [employer]   Social History Main Topics  . Smoking status: Never Smoker  . Smokeless tobacco: Never Used  . Alcohol use Yes     Comment: Regular  . Drug use: No  . Sexual  activity: Not on file   Other Topics Concern  . Not on file   Social History Narrative   Divorced   Common law marriage since 2006      1 daughter   Review of Systems  Constitutional: Positive for fatigue.       Weight down ~10# Wears seat belt  HENT: Positive for dental problem. Negative for hearing loss, tinnitus and trouble swallowing.        Trying to work with dentist  Eyes: Negative for visual disturbance.       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   Cardiovascular: Negative for chest pain, palpitations and leg swelling.  Gastrointestinal: Negative for abdominal pain, blood in stool, constipation and nausea.       No heartburn  Endocrine: Negative for polydipsia and polyuria.  Genitourinary: Negative for difficulty urinating, dysuria and urgency.       Some ED  Musculoskeletal: Positive for arthralgias. Negative for back pain and joint swelling.  Skin: Negative for rash.       No suspicious lesions  Allergic/Immunologic: Positive for environmental allergies. Negative  for immunocompromised state.       No meds  Neurological: Positive for headaches. Negative for dizziness, syncope and light-headedness.  Hematological: Negative for adenopathy. Does not bruise/bleed easily.  Psychiatric/Behavioral: Positive for sleep disturbance. Negative for dysphoric mood. The patient is not nervous/anxious.        Objective:   Physical Exam  Constitutional: He is oriented to person, place, and time. He appears well-developed and well-nourished. No distress.  HENT:  Head: Normocephalic and atraumatic.  Right Ear: External ear normal.  Left Ear: External ear normal.  Mouth/Throat: Oropharynx is clear and moist. No oropharyngeal exudate.  Eyes: Pupils are equal, round, and reactive to light. Conjunctivae are normal.  Neck: Normal range of motion. No thyromegaly present.  Cardiovascular:  Normal rate, regular rhythm, normal heart sounds and intact distal pulses.  Exam reveals no gallop.   No murmur heard. Pulmonary/Chest: Effort normal and breath sounds normal.  Abdominal: Soft. He exhibits no distension. There is no tenderness. There is no rebound and no guarding.  Musculoskeletal: He exhibits no edema or tenderness.  Lymphadenopathy:    He has no cervical adenopathy.  Neurological: He is alert and oriented to person, place, and time.  Skin: No rash noted. No erythema.  Psychiatric: He has a normal mood and affect. His behavior is normal.          Assessment & Plan:

## 2016-09-30 NOTE — Assessment & Plan Note (Signed)
Causing fatigue Will try melatonin, then trazodone

## 2016-09-30 NOTE — Patient Instructions (Signed)
Please try over the counter melatonin. I recommend starting with 3mg --but increasing to 6mg  or even 9mg  every few nights to see if that helps your sleep. If that doesn't work, let me know and I will try a prescription for you (trazodone).  DASH Eating Plan DASH stands for "Dietary Approaches to Stop Hypertension." The DASH eating plan is a healthy eating plan that has been shown to reduce high blood pressure (hypertension). It may also reduce your risk for type 2 diabetes, heart disease, and stroke. The DASH eating plan may also help with weight loss. What are tips for following this plan? General guidelines  Avoid eating more than 2,300 mg (milligrams) of salt (sodium) a day. If you have hypertension, you may need to reduce your sodium intake to 1,500 mg a day.  Limit alcohol intake to no more than 1 drink a day for nonpregnant women and 2 drinks a day for men. One drink equals 12 oz of beer, 5 oz of wine, or 1 oz of hard liquor.  Work with your health care provider to maintain a healthy body weight or to lose weight. Ask what an ideal weight is for you.  Get at least 30 minutes of exercise that causes your heart to beat faster (aerobic exercise) most days of the week. Activities may include walking, swimming, or biking.  Work with your health care provider or diet and nutrition specialist (dietitian) to adjust your eating plan to your individual calorie needs. Reading food labels  Check food labels for the amount of sodium per serving. Choose foods with less than 5 percent of the Daily Value of sodium. Generally, foods with less than 300 mg of sodium per serving fit into this eating plan.  To find whole grains, look for the word "whole" as the first word in the ingredient list. Shopping  Buy products labeled as "low-sodium" or "no salt added."  Buy fresh foods. Avoid canned foods and premade or frozen meals. Cooking  Avoid adding salt when cooking. Use salt-free seasonings or herbs  instead of table salt or sea salt. Check with your health care provider or pharmacist before using salt substitutes.  Do not fry foods. Cook foods using healthy methods such as baking, boiling, grilling, and broiling instead.  Cook with heart-healthy oils, such as olive, canola, soybean, or sunflower oil. Meal planning   Eat a balanced diet that includes: ? 5 or more servings of fruits and vegetables each day. At each meal, try to fill half of your plate with fruits and vegetables. ? Up to 6-8 servings of whole grains each day. ? Less than 6 oz of lean meat, poultry, or fish each day. A 3-oz serving of meat is about the same size as a deck of cards. One egg equals 1 oz. ? 2 servings of low-fat dairy each day. ? A serving of nuts, seeds, or beans 5 times each week. ? Heart-healthy fats. Healthy fats called Omega-3 fatty acids are found in foods such as flaxseeds and coldwater fish, like sardines, salmon, and mackerel.  Limit how much you eat of the following: ? Canned or prepackaged foods. ? Food that is high in trans fat, such as fried foods. ? Food that is high in saturated fat, such as fatty meat. ? Sweets, desserts, sugary drinks, and other foods with added sugar. ? Full-fat dairy products.  Do not salt foods before eating.  Try to eat at least 2 vegetarian meals each week.  Eat more home-cooked food and less  restaurant, buffet, and fast food.  When eating at a restaurant, ask that your food be prepared with less salt or no salt, if possible. What foods are recommended? The items listed may not be a complete list. Talk with your dietitian about what dietary choices are best for you. Grains Whole-grain or whole-wheat bread. Whole-grain or whole-wheat pasta. Brown rice. Modena Morrow. Bulgur. Whole-grain and low-sodium cereals. Pita bread. Low-fat, low-sodium crackers. Whole-wheat flour tortillas. Vegetables Fresh or frozen vegetables (raw, steamed, roasted, or grilled).  Low-sodium or reduced-sodium tomato and vegetable juice. Low-sodium or reduced-sodium tomato sauce and tomato paste. Low-sodium or reduced-sodium canned vegetables. Fruits All fresh, dried, or frozen fruit. Canned fruit in natural juice (without added sugar). Meat and other protein foods Skinless chicken or [place]. Ground chicken or [place]. Pork with fat trimmed off. Fish and seafood. Egg whites. Dried beans, peas, or lentils. Unsalted nuts, nut butters, and seeds. Unsalted canned beans. Lean cuts of beef with fat trimmed off. Low-sodium, lean deli meat. Dairy Low-fat (1%) or fat-free (skim) milk. Fat-free, low-fat, or reduced-fat cheeses. Nonfat, low-sodium ricotta or cottage cheese. Low-fat or nonfat yogurt. Low-fat, low-sodium cheese. Fats and oils Soft margarine without trans fats. Vegetable oil. Low-fat, reduced-fat, or light mayonnaise and salad dressings (reduced-sodium). Canola, safflower, olive, soybean, and sunflower oils. Avocado. Seasoning and other foods Herbs. Spices. Seasoning mixes without salt. Unsalted popcorn and pretzels. Fat-free sweets. What foods are not recommended? The items listed may not be a complete list. Talk with your dietitian about what dietary choices are best for you. Grains Baked goods made with fat, such as croissants, muffins, or some breads. Dry pasta or rice meal packs. Vegetables Creamed or fried vegetables. Vegetables in a cheese sauce. Regular canned vegetables (not low-sodium or reduced-sodium). Regular canned tomato sauce and paste (not low-sodium or reduced-sodium). Regular tomato and vegetable juice (not low-sodium or reduced-sodium). Angie Fava. Olives. Fruits Canned fruit in a light or heavy syrup. Fried fruit. Fruit in cream or butter sauce. Meat and other protein foods Fatty cuts of meat. Ribs. Fried meat. Berniece Salines. Sausage. Bologna and other processed lunch meats. Salami. Fatback. Hotdogs. Bratwurst. Salted nuts and seeds. Canned beans with added  salt. Canned or smoked fish. Whole eggs or egg yolks. Chicken or [place] with skin. Dairy Whole or 2% milk, cream, and half-and-half. Whole or full-fat cream cheese. Whole-fat or sweetened yogurt. Full-fat cheese. Nondairy creamers. Whipped toppings. Processed cheese and cheese spreads. Fats and oils Butter. Stick margarine. Lard. Shortening. Ghee. Bacon fat. Tropical oils, such as coconut, palm kernel, or palm oil. Seasoning and other foods Salted popcorn and pretzels. Onion salt, garlic salt, seasoned salt, table salt, and sea salt. Worcestershire sauce. Tartar sauce. Barbecue sauce. Teriyaki sauce. Soy sauce, including reduced-sodium. Steak sauce. Canned and packaged gravies. Fish sauce. Oyster sauce. Cocktail sauce. Horseradish that you find on the shelf. Ketchup. Mustard. Meat flavorings and tenderizers. Bouillon cubes. Hot sauce and Tabasco sauce. Premade or packaged marinades. Premade or packaged taco seasonings. Relishes. Regular salad dressings. Where to find more information:  National Heart, Lung, and [place]: https://wilson-eaton.com/  American Heart Association: www.heart.org Summary  The DASH eating plan is a healthy eating plan that has been shown to reduce high blood pressure (hypertension). It may also reduce your risk for type 2 diabetes, heart disease, and stroke.  With the DASH eating plan, you should limit salt (sodium) intake to 2,300 mg a day. If you have hypertension, you may need to reduce your sodium intake to 1,500 mg a day.  When on the DASH eating plan, aim to eat more fresh fruits and vegetables, whole grains, lean proteins, low-fat dairy, and heart-healthy fats.  Work with your health care provider or diet and nutrition specialist (dietitian) to adjust your eating plan to your individual calorie needs. This information is not intended to replace advice given to you by your health care provider. Make sure you discuss any questions you have with your health care  provider. Document Released: 02/03/2011 Document Revised: 02/08/2016 Document Reviewed: 02/08/2016 Elsevier Interactive Patient Education  2017 [employer].

## 2016-09-30 NOTE — Assessment & Plan Note (Signed)
Fairly healthy overall Discussed exercise No cancer screening till 50 UTD on tetanus Recommended yearly flu vaccine

## 2016-12-30 ENCOUNTER — Encounter: Payer: Self-pay | Admitting: Internal Medicine

## 2016-12-30 ENCOUNTER — Ambulatory Visit (INDEPENDENT_AMBULATORY_CARE_PROVIDER_SITE_OTHER): Payer: 59 | Admitting: Internal Medicine

## 2016-12-30 VITALS — BP 132/90 | HR 84 | Temp 98.5°F | Wt 165.0 lb

## 2016-12-30 DIAGNOSIS — I10 Essential (primary) hypertension: Secondary | ICD-10-CM

## 2016-12-30 NOTE — Progress Notes (Signed)
   Subjective:    Patient ID: Levi Mejia, male    DOB: 04/10/1970, 46 y.o.   MRN: [id number]  HPI Here for follow up of elevated BP  He hasn't been checking BP Has been working on lifestyle--less fatty food Sleep is somewhat better--using melatonin  No chest pain No SOB No dizziness or syncope  Current Outpatient Prescriptions on File Prior to Visit  Medication Sig Dispense Refill  . Ascorbic Acid (VITAMIN C) 1000 MG tablet Take 1,000 mg by mouth daily.    Marland Kitchen. ibuprofen (ADVIL,MOTRIN) 200 MG tablet Take 200 mg by mouth every 6 (six) hours as needed.    . Multiple Vitamin (MULTIVITAMIN) tablet Take 1 tablet by mouth daily.     No current facility-administered medications on file prior to visit.     No Known Allergies  Past Medical History:  Diagnosis Date  . Attention deficit disorder without mention of hyperactivity     Past Surgical History:  Procedure Laterality Date  . Hand tendon reattachment  1992   Right hand  . toe reattachment  1996   Right 1st toe    Family History  Problem Relation Age of Onset  . Heart attack Father 2555  . Hypertension Father   . Coronary artery disease Mother 365       Stent  . Cancer Mother        Bone  . Hypertension Mother   . Hypertension Brother   . Diabetes Neg Hx   . Prostate cancer Neg Hx   . Colon cancer Neg Hx   . Heart disease Neg Hx     Social History   Social History  . Marital status: Divorced    Spouse name: N/A  . Number of children: 1  . Years of education: N/A   Occupational History  . Mechanic     [employer]   Social History Main Topics  . Smoking status: Never Smoker  . Smokeless tobacco: Never Used  . Alcohol use Yes     Comment: Regular  . Drug use: No  . Sexual activity: Not on file   Other Topics Concern  . Not on file   Social History Narrative   Divorced   Common law marriage since 2006      1 daughter   Review of Systems No set exercise Appetite is fine    Objective:     Physical Exam  Constitutional: He appears well-nourished. No distress.  Neck: No thyromegaly present.  Cardiovascular: Normal rate, regular rhythm and normal heart sounds.  Exam reveals no gallop.   No murmur heard. Pulmonary/Chest: Effort normal and breath sounds normal. No respiratory distress. He has no wheezes. He has no rales.  Musculoskeletal: He exhibits no edema.  Psychiatric: He has a normal mood and affect. His behavior is normal.          Assessment & Plan:

## 2016-12-30 NOTE — Assessment & Plan Note (Signed)
BP Readings from Last 3 Encounters:  12/30/16 132/90  09/30/16 (!) 142/96  10/07/11 (!) 160/98   Better now Will hold off on medications for now counseled

## 2017-05-01 ENCOUNTER — Telehealth: Payer: Self-pay

## 2017-05-01 NOTE — Telephone Encounter (Signed)
Pt said the melatonin is not effective to help pt sleep; pt can go to sleep but wakes up in middle of night and cannot return to sleep. Pt taking zolpidem 6.25 mg SA TB (pts girlfriends med)which does help pt sleep. [facility]  09/30/16 at annual visit sleep issue discussed. Dr Alphonsus SiasLetvak out of office until 05/08/17.Please advise.

## 2017-05-01 NOTE — Telephone Encounter (Signed)
Spoke to pt. He wants to wait for Dr Alphonsus SiasLetvak to return on Monday.

## 2017-05-01 NOTE — Telephone Encounter (Signed)
Dr. Alphonsus SiasLetvak is out of the office this week.   He should understand that taking another person's Remus Lofflerambien is a felony, and he should not be doing this.   I never start someone on ambien or similar medications over the phone.   I would be comfortable giving him some non-addictive sleep medication.   Trazodone 50 mg, 1-2 tablets po 30 minutes before bed, #60, 0 refills.     He can always follow-up with Dr. Alphonsus SiasLetvak next week to discuss further.

## 2017-05-01 NOTE — Telephone Encounter (Signed)
PLEASE NOTE: All timestamps contained within this report are represented as [place] Standard Time. CONFIDENTIALTY NOTICE: This fax transmission is intended only for the addressee. It contains information that is legally privileged, confidential or otherwise protected from use or disclosure. If you are not the intended recipient, you are strictly prohibited from reviewing, disclosing, copying using or disseminating any of this information or taking any action in reliance on or regarding this information. If you have received this fax in error, please notify us immediately by telephone so that we can arrange for its return to us. Phone: [phone], Toll-Free: [phone], Fax: [phone] Page: 1 of 1 Call Id: [id number] [facility] - Client Nonclinical Telephone Record [facility] - [facility] Site [facility] Physician Tillman AbideLetvak, Richard - MD Contact Type Call Who Is Calling Patient / Member / Family / Caregiver Caller Name [name] Caller Phone Number [phone] Patient Name Levi Mejia Patient DOB 01/07/1971 Call Type Message Only Information Provided Reason for Call Request for General Office Information Initial Comment Message: Caller states is needing a prescription to be filled for her husband. Additional Comment Call Closed By: Macario CarlsIngrid Hurst Transaction Date/Time: 04/28/2017 6:38:20 PM (ET)

## 2017-05-08 NOTE — Telephone Encounter (Signed)
Let him know that I don't recommend zolpidem since it causes sleep walking and other bothersome side effects in some people---and it is one of the most addictive medications around (I can't get anyone off it once they start!) At our discussion, I mentioned trazodone and I would recommend starting that first. If agreeable, send Rx for 50mg  #60 x11 1-2 at bedtime to help sleep

## 2017-05-08 NOTE — Telephone Encounter (Signed)
Left detailed message on VM and asked pt to call to let us know if he wants to try the trazodone

## 2017-08-15 DIAGNOSIS — R112 Nausea with vomiting, unspecified: Secondary | ICD-10-CM | POA: Diagnosis not present

## 2018-01-05 ENCOUNTER — Encounter: Payer: 59 | Admitting: Internal Medicine

## 2018-01-23 DIAGNOSIS — M62830 Muscle spasm of back: Secondary | ICD-10-CM | POA: Diagnosis not present

## 2018-01-23 DIAGNOSIS — M545 Low back pain: Secondary | ICD-10-CM | POA: Diagnosis not present

## 2018-02-02 ENCOUNTER — Encounter: Payer: Self-pay | Admitting: Internal Medicine

## 2018-02-02 ENCOUNTER — Ambulatory Visit: Payer: 59 | Admitting: Internal Medicine

## 2018-02-02 VITALS — BP 130/88 | HR 77 | Temp 97.7°F | Ht 70.0 in | Wt 165.0 lb

## 2018-02-02 DIAGNOSIS — R55 Syncope and collapse: Secondary | ICD-10-CM | POA: Diagnosis not present

## 2018-02-02 LAB — COMPREHENSIVE METABOLIC PANEL
ALBUMIN: 4.2 g/dL (ref 3.5–5.2)
ALT: 29 U/L (ref 0–53)
AST: 26 U/L (ref 0–37)
Alkaline Phosphatase: 74 U/L (ref 39–117)
BILIRUBIN TOTAL: 0.4 mg/dL (ref 0.2–1.2)
BUN: 9 mg/dL (ref 6–23)
CO2: 27 mEq/L (ref 19–32)
CREATININE: 0.98 mg/dL (ref 0.40–1.50)
Calcium: 9.1 mg/dL (ref 8.4–10.5)
Chloride: 103 mEq/L (ref 96–112)
GFR: 86.98 mL/min (ref >60.00)
GLUCOSE: 98 mg/dL (ref 70–99)
POTASSIUM: 4.4 meq/L (ref 3.5–5.1)
SODIUM: 138 meq/L (ref 135–145)
TOTAL PROTEIN: 7.4 g/dL (ref 6.0–8.3)

## 2018-02-02 LAB — CBC
HCT: 45.8 % (ref 39.0–52.0)
Hemoglobin: 15.6 g/dL (ref 13.0–17.0)
MCHC: 34.1 g/dL (ref 30.0–36.0)
MCV: 97.8 fl (ref 78.0–100.0)
PLATELETS: 233 10*3/uL (ref 150.0–400.0)
RBC: 4.68 Mil/uL (ref 4.22–5.81)
RDW: 12.3 % (ref 11.5–15.5)
WBC: 5.2 10*3/uL (ref 4.0–10.5)

## 2018-02-02 LAB — T4, FREE: FREE T4: 0.6 ng/dL (ref 0.60–1.60)

## 2018-02-02 NOTE — Assessment & Plan Note (Addendum)
Sounds like classic vasovagal spell Nothing to suggest seizure otherwise---don't think EEG or neuro eval indicated Will check EKG to be sure nothing obvious Sinus at 72. Normal axis and intervals. No ischemic changes. No change from 11/21/11 Check labs---to be sure no anemia, metabolic disturbance Overall---reassured

## 2018-02-02 NOTE — Progress Notes (Signed)
Subjective:    Patient ID: Levi Mejia, male    DOB: 08/15/1970, 47 y.o.   MRN: [id number]  HPI Here due to fainting spell  Started with bad back pain 11 days ago Micah FlesherWent to urgent care the next day --got methocarbamol and prednisone Got tramadol but didn't take Started projectile vomiting soon after taking these meds Took left over zofran---helped the nausea  Later that night--11PM Common law wife hurt thump and gurgling Found him between toilet and wall Couldn't get him to respond at first Called 911----but then he woke and was able to walk again BP 138/something Sugar okay Declined ER visit  The next day---felt really sore in legs and arms No LOC since then Back has improved  No tongue bite No incontinence   Current Outpatient Medications on File Prior to Visit  Medication Sig Dispense Refill  . Ascorbic Acid (VITAMIN C) 1000 MG tablet Take 1,000 mg by mouth daily.    Marland Kitchen. ibuprofen (ADVIL,MOTRIN) 200 MG tablet Take 200 mg by mouth every 6 (six) hours as needed.    . Multiple Vitamin (MULTIVITAMIN) tablet Take 1 tablet by mouth daily.     No current facility-administered medications on file prior to visit.     No Known Allergies  Past Medical History:  Diagnosis Date  . Attention deficit disorder without mention of hyperactivity     Past Surgical History:  Procedure Laterality Date  . Hand tendon reattachment  1992   Right hand  . toe reattachment  1996   Right 1st toe    Family History  Problem Relation Age of Onset  . Heart attack Father 7855  . Hypertension Father   . Coronary artery disease Mother 4765       Stent  . Cancer Mother        Bone  . Hypertension Mother   . Hypertension Brother   . Diabetes Neg Hx   . Prostate cancer Neg Hx   . Colon cancer Neg Hx   . Heart disease Neg Hx     Social History   Socioeconomic History  . Marital status: Divorced    Spouse name: Not on file  . Number of children: 1  . Years of education: Not on file   . Highest education level: Not on file  Occupational History  . Occupation: CuratorMechanic    Comment: [employer]  Social Needs  . Financial resource strain: Not on file  . Food insecurity:    Worry: Not on file    Inability: Not on file  . Transportation needs:    Medical: Not on file    Non-medical: Not on file  Tobacco Use  . Smoking status: Never Smoker  . Smokeless tobacco: Never Used  Substance and Sexual Activity  . Alcohol use: Yes    Comment: Regular  . Drug use: No  . Sexual activity: Not on file  Lifestyle  . Physical activity:    Days per week: Not on file    Minutes per session: Not on file  . Stress: Not on file  Relationships  . Social connections:    Talks on phone: Not on file    Gets together: Not on file    Attends religious service: Not on file    Active member of club or organization: Not on file    Attends meetings of clubs or organizations: Not on file    Relationship status: Not on file  . Intimate partner violence:  Fear of current or ex partner: Not on file    Emotionally abused: Not on file    Physically abused: Not on file    Forced sexual activity: Not on file  Other Topics Concern  . Not on file  Social History Narrative   Divorced   Common law marriage since 2006      1 daughter   Review of Systems  Appetite is okay No headaches Still tired but no focal weakness, aphasia, dysphagia, facial droop Has been to work daily in the past week     Objective:   Physical Exam  Constitutional: He appears well-developed. No distress.  Neck: No thyromegaly present.  Cardiovascular: Normal rate, regular rhythm, normal heart sounds and intact distal pulses. Exam reveals no gallop.  No murmur heard. Respiratory: Effort normal and breath sounds normal. No respiratory distress. He has no wheezes. He has no rales.  GI: Soft. There is no tenderness.  Musculoskeletal: He exhibits no edema.  Lymphadenopathy:    He has no cervical adenopathy.    Psychiatric: He has a normal mood and affect. His behavior is normal.           Assessment & Plan:

## 2018-02-23 ENCOUNTER — Encounter: Payer: Self-pay | Admitting: Emergency Medicine

## 2018-02-23 ENCOUNTER — Other Ambulatory Visit: Payer: Self-pay

## 2018-02-23 ENCOUNTER — Emergency Department: Payer: Commercial Managed Care - HMO

## 2018-02-23 ENCOUNTER — Emergency Department
Admission: EM | Admit: 2018-02-23 | Discharge: 2018-02-23 | Disposition: A | Discharge status: Home / Self Care | Payer: Commercial Managed Care - HMO | Attending: Emergency Medicine | Admitting: Emergency Medicine

## 2018-02-23 DIAGNOSIS — Z79899 Other long term (current) drug therapy: Secondary | ICD-10-CM | POA: Diagnosis not present

## 2018-02-23 DIAGNOSIS — E860 Dehydration: Secondary | ICD-10-CM

## 2018-02-23 DIAGNOSIS — R55 Syncope and collapse: Secondary | ICD-10-CM | POA: Insufficient documentation

## 2018-02-23 DIAGNOSIS — E876 Hypokalemia: Secondary | ICD-10-CM | POA: Diagnosis not present

## 2018-02-23 DIAGNOSIS — F10930 Alcohol use, unspecified with withdrawal, uncomplicated: Secondary | ICD-10-CM

## 2018-02-23 DIAGNOSIS — R410 Disorientation, unspecified: Secondary | ICD-10-CM | POA: Diagnosis not present

## 2018-02-23 DIAGNOSIS — F10230 Alcohol dependence with withdrawal, uncomplicated: Secondary | ICD-10-CM

## 2018-02-23 DIAGNOSIS — I10 Essential (primary) hypertension: Secondary | ICD-10-CM | POA: Diagnosis not present

## 2018-02-23 LAB — COMPREHENSIVE METABOLIC PANEL
ALBUMIN: 4.6 g/dL (ref 3.5–5.0)
ALT: 29 U/L (ref 0–44)
AST: 48 U/L — ABNORMAL HIGH (ref 15–41)
Alkaline Phosphatase: 75 U/L (ref 38–126)
Anion gap: 12 (ref 5–15)
BUN: 11 mg/dL (ref 6–20)
CO2: 27 mmol/L (ref 22–32)
Calcium: 8.5 mg/dL — ABNORMAL LOW (ref 8.9–10.3)
Chloride: 92 mmol/L — ABNORMAL LOW (ref 98–111)
Creatinine, Ser: 1 mg/dL (ref 0.61–1.24)
GFR calc Af Amer: >60 mL/min (ref >60)
GFR calc non Af Amer: >60 mL/min (ref >60)
GLUCOSE: 116 mg/dL — AB (ref 70–99)
Potassium: 3.2 mmol/L — ABNORMAL LOW (ref 3.5–5.1)
SODIUM: 131 mmol/L — AB (ref 135–145)
Total Bilirubin: 2.4 mg/dL — ABNORMAL HIGH (ref 0.3–1.2)
Total Protein: 8.1 g/dL (ref 6.5–8.1)

## 2018-02-23 LAB — URINALYSIS, COMPLETE (UACMP) WITH MICROSCOPIC
Bacteria, UA: NONE SEEN
Bilirubin Urine: NEGATIVE
Glucose, UA: NEGATIVE mg/dL
HGB URINE DIPSTICK: NEGATIVE
Ketones, ur: 5 mg/dL — AB
Leukocytes, UA: NEGATIVE
Nitrite: NEGATIVE
Protein, ur: NEGATIVE mg/dL
Specific Gravity, Urine: 1.005 (ref 1.005–1.030)
WBC UA: NONE SEEN WBC/hpf (ref 0–5)
pH: 6 (ref 5.0–8.0)

## 2018-02-23 LAB — CBC WITH DIFFERENTIAL/PLATELET
Abs Immature Granulocytes: 0.03 10*3/uL (ref 0.00–0.07)
Basophils Absolute: 0.1 10*3/uL (ref 0.0–0.1)
Basophils Relative: 1 %
Eosinophils Absolute: 0.2 10*3/uL (ref 0.0–0.5)
Eosinophils Relative: 2 %
HCT: 46.2 % (ref 39.0–52.0)
Hemoglobin: 16.5 g/dL (ref 13.0–17.0)
Immature Granulocytes: 0 %
Lymphocytes Relative: 19 %
Lymphs Abs: 2 10*3/uL (ref 0.7–4.0)
MCH: 33.3 pg (ref 26.0–34.0)
MCHC: 35.7 g/dL (ref 30.0–36.0)
MCV: 93.1 fL (ref 80.0–100.0)
Monocytes Absolute: 1.2 10*3/uL — ABNORMAL HIGH (ref 0.1–1.0)
Monocytes Relative: 11 %
Neutro Abs: 7.5 10*3/uL (ref 1.7–7.7)
Neutrophils Relative %: 67 %
Platelets: 186 10*3/uL (ref 150–400)
RBC: 4.96 MIL/uL (ref 4.22–5.81)
RDW: 11.2 % — ABNORMAL LOW (ref 11.5–15.5)
WBC: 11 10*3/uL — ABNORMAL HIGH (ref 4.0–10.5)
nRBC: 0 % (ref 0.0–0.2)

## 2018-02-23 LAB — TROPONIN I
Troponin I: <0.03 ng/mL (ref <0.03)
Troponin I: <0.03 ng/mL (ref <0.03)

## 2018-02-23 LAB — LIPASE, BLOOD: Lipase: 33 U/L (ref 11–51)

## 2018-02-23 LAB — AMMONIA: Ammonia: <9 umol/L — ABNORMAL LOW (ref 9–35)

## 2018-02-23 MED ORDER — SODIUM CHLORIDE 0.9 % IV BOLUS
1000.0000 mL | Freq: Once | INTRAVENOUS | Status: AC
  Administered 2018-02-23: 1000 mL via INTRAVENOUS

## 2018-02-23 MED ORDER — LORAZEPAM 2 MG/ML IJ SOLN
1.0000 mg | Freq: Once | INTRAMUSCULAR | Status: AC
  Administered 2018-02-23: 1 mg via INTRAVENOUS
  Filled 2018-02-23: qty 1

## 2018-02-23 MED ORDER — LORAZEPAM 2 MG/ML IJ SOLN: 0.0000 mg | Freq: Four times a day (QID) | INTRAMUSCULAR | Status: DC

## 2018-02-23 MED ORDER — POTASSIUM CHLORIDE CRYS ER 20 MEQ PO TBCR
40.0000 meq | EXTENDED_RELEASE_TABLET | Freq: Once | ORAL | Status: AC
  Administered 2018-02-23: 40 meq via ORAL
  Filled 2018-02-23: qty 2

## 2018-02-23 MED ORDER — LORAZEPAM 1 MG PO TABS: 1.0000 mg | ORAL_TABLET | Freq: Three times a day (TID) | ORAL | 0 refills | Status: DC | PRN

## 2018-02-23 MED ORDER — THIAMINE HCL 100 MG/ML IJ SOLN
Freq: Once | INTRAVENOUS | Status: AC
  Administered 2018-02-23: 05:00:00 via INTRAVENOUS
  Filled 2018-02-23: qty 1000

## 2018-02-23 MED ORDER — THIAMINE HCL 100 MG/ML IJ SOLN: 100.0000 mg | Freq: Every day | INTRAMUSCULAR | Status: DC

## 2018-02-23 NOTE — ED Triage Notes (Addendum)
Patient ambulatory to triage with steady gait, without difficulty or distress noted; pt reports he awoke at 1230am with "no recollection of the last several days"; denies any injury, no recent illness, denies pain; went to sleep at 9pm; pt is accomp by SO who reports pt with syncopal episode on Wednesday

## 2018-02-23 NOTE — ED Provider Notes (Signed)
[facility] Emergency Department Provider Note   ____________________________________________   First MD Initiated Contact with Patient 02/23/18 [phone]     (approximate)  I have reviewed the triage vital signs and the nursing notes.   HISTORY  Chief Complaint Loss of Consciousness    HPI Levi Mejia is a 47 y.o. male who presents to the ED from home with a chief complaint of memory loss.  Patient reports syncopal episode on 12/25 while he was in the bed.  This morning he awoke at 12:30 AM with memory loss of the last several days.  He did also have a syncopal episode at the beginning of the month for which he saw his PCP.  Spouse reports patient is a daily drinker, drinking approximately 1/5 liquor daily.  Last drink was 12/25.  History of DTs.  Patient denies headache, vision changes, neck pain, extremity weakness, numbness/tingling, chest pain, shortness of breath, abdominal pain, nausea or vomiting.  Denies recent trauma or travel.  Denies anticoagulant use.   Past Medical History:  Diagnosis Date  . Attention deficit disorder without mention of hyperactivity     Patient Active Problem List   Diagnosis Date Noted  . Syncope 02/02/2018  . Preventative health care 09/30/2016  . Sleep disorder 09/30/2016  . Essential hypertension, benign 08/23/2011  . BACK PAIN, LUMBAR 07/17/2008    Past Surgical History:  Procedure Laterality Date  . Hand tendon reattachment  1992   Right hand  . toe reattachment  1996   Right 1st toe    Prior to Admission medications   Medication Sig Start Date End Date Taking? Authorizing Provider  Ascorbic Acid (VITAMIN C) 1000 MG tablet Take 1,000 mg by mouth daily.    [provider]  ibuprofen (ADVIL,MOTRIN) 200 MG tablet Take 200 mg by mouth every 6 (six) hours as needed.    [provider]  LORazepam (ATIVAN) 1 MG tablet Take 1 tablet (1 mg total) by mouth every 8 (eight) hours as needed for  anxiety. 02/23/18   Irean HongSung, Jade J, MD  Multiple Vitamin (MULTIVITAMIN) tablet Take 1 tablet by mouth daily.    [provider]    Allergies Patient has no known allergies.  Family History  Problem Relation Age of Onset  . Heart attack Father 4955  . Hypertension Father   . Coronary artery disease Mother 7065       Stent  . Cancer Mother        Bone  . Hypertension Mother   . Hypertension Brother   . Diabetes Neg Hx   . Prostate cancer Neg Hx   . Colon cancer Neg Hx   . Heart disease Neg Hx     Social History Social History   Tobacco Use  . Smoking status: Never Smoker  . Smokeless tobacco: Never Used  Substance Use Topics  . Alcohol use: Yes    Comment: Regular  . Drug use: No    Review of Systems  Constitutional: No fever/chills Eyes: No visual changes. ENT: No sore throat. Cardiovascular: Denies chest pain. Respiratory: Denies shortness of breath. Gastrointestinal: No abdominal pain.  No nausea, no vomiting.  No diarrhea.  No constipation. Genitourinary: Negative for dysuria. Musculoskeletal: Negative for back pain. Skin: Negative for rash. Neurological: Positive for memory loss.  Negative for headaches, focal weakness or numbness.   ____________________________________________   PHYSICAL EXAM:  VITAL SIGNS: ED Triage Vitals  Enc Vitals Group     BP 02/23/18 0124 (!) 165/109  Pulse Rate 02/23/18 0124 92     Resp 02/23/18 0124 18     Temp 02/23/18 0124 98 F (36.7 C)     Temp Source 02/23/18 0124 Oral     SpO2 02/23/18 0124 99 %     Weight 02/23/18 0123 160 lb (72.6 kg)     Height 02/23/18 0123 5\' 10"  (1.778 m)     Head Circumference --      Peak Flow --      Pain Score 02/23/18 0123 0     Pain Loc --      Pain Edu? --      Excl. in GC? --     Constitutional: Alert and oriented. Well appearing and in no acute distress. Eyes: Conjunctivae are normal. PERRL. EOMI. Head: Atraumatic. Nose: No congestion/rhinnorhea. Mouth/Throat: Mucous  membranes are moist.  Oropharynx non-erythematous. Neck: No stridor.  Supple neck without meningismus.  No carotid bruits. Cardiovascular: Normal rate, regular rhythm. Grossly normal heart sounds.  Good peripheral circulation. Respiratory: Normal respiratory effort.  No retractions. Lungs CTAB. Gastrointestinal: Soft and nontender. No distention. No abdominal bruits. No CVA tenderness. Musculoskeletal: No lower extremity tenderness nor edema.  No joint effusions. Neurologic: Oriented x3.  CN II-XII grossly intact. Normal speech and language. No gross focal neurologic deficits are appreciated. MAEx4.  Skin:  Skin is warm, dry and intact. No rash noted. Psychiatric: Mood and affect are normal. Speech and behavior are normal.  ____________________________________________   LABS (all labs ordered are listed, but only abnormal results are displayed)  Labs Reviewed  CBC WITH DIFFERENTIAL/PLATELET - Abnormal; Notable for the following components:      Result Value   WBC 11.0 (*)    RDW 11.2 (*)    Monocytes Absolute 1.2 (*)    All other components within normal limits  COMPREHENSIVE METABOLIC PANEL - Abnormal; Notable for the following components:   Sodium 131 (*)    Potassium 3.2 (*)    Chloride 92 (*)    Glucose, Bld 116 (*)    Calcium 8.5 (*)    AST 48 (*)    Total Bilirubin 2.4 (*)    All other components within normal limits  URINALYSIS, COMPLETE (UACMP) WITH MICROSCOPIC - Abnormal; Notable for the following components:   Color, Urine STRAW (*)    APPearance CLEAR (*)    Ketones, ur 5 (*)    All other components within normal limits  AMMONIA - Abnormal; Notable for the following components:   Ammonia <9 (*)    All other components within normal limits  TROPONIN I  LIPASE, BLOOD  TROPONIN I   ____________________________________________  EKG  ED ECG REPORT I, SUNG,JADE J, the attending physician, personally viewed and interpreted this ECG.   Date: 02/23/2018  EKG Time:  0135  Rate: 90  Rhythm: normal EKG, normal sinus rhythm  Axis: Normal  Intervals:none  ST&T Change: Nonspecific  ____________________________________________  RADIOLOGY  ED MD interpretation: No ICH; chronic lacunar infarct  Official radiology report(s): Ct Head Wo Contrast  Result Date: 02/23/2018 CLINICAL DATA:  Acute onset of syncope and confusion. EXAM: CT HEAD WITHOUT CONTRAST TECHNIQUE: Contiguous axial images were obtained from the base of the skull through the vertex without intravenous contrast. COMPARISON:  None. FINDINGS: Brain: No evidence of acute infarction, hemorrhage, hydrocephalus, extra-axial collection or mass lesion/mass effect. Mild periventricular white matter change likely reflects small vessel ischemic microangiopathy. A small chronic lacunar infarct is noted at the right mid corona radiata. The posterior fossa, including  the cerebellum, brainstem and fourth ventricle, is within normal limits. The third and lateral ventricles, and basal ganglia are unremarkable in appearance. The cerebral hemispheres are symmetric in appearance, with normal gray-white differentiation. No mass effect or midline shift is seen. Vascular: No hyperdense vessel or unexpected calcification. Skull: There is no evidence of fracture; visualized osseous structures are unremarkable in appearance. Sinuses/Orbits: The orbits are within normal limits. There is opacification of the right maxillary sinus. The remaining paranasal sinuses and mastoid air cells are well-aerated. Other: No significant soft tissue abnormalities are seen. IMPRESSION: 1. No acute intracranial pathology seen on CT. 2. Mild small vessel ischemic microangiopathy. 3. 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 4. Opacification of the right maxillary sinus. Electronically Signed   By: Roanna RaiderJeffery  Chang M.D.   On: 02/23/2018 02:42    ____________________________________________   PROCEDURES  Procedure(s) performed:  None  Procedures  Critical Care performed: No  ____________________________________________   INITIAL IMPRESSION / ASSESSMENT AND PLAN / ED COURSE  As part of my medical decision making, I reviewed the following data within the electronic MEDICAL RECORD NUMBER History obtained from family, Nursing notes reviewed and incorporated, Labs reviewed, EKG interpreted, Old chart reviewed, Radiograph reviewed and Notes from prior ED visits   47 year old male with alcohol abuse who presents with "memory loss".  Syncopal episode both at the beginning of the month and 2 days ago.  Differential diagnosis includes but is not limited to ICH, CVA, alcohol withdrawal, metabolic, infectious etiologies, etc.  Laboratory results remarkable for mild hypokalemia and hyponatremia, elevated T bili.  Patient does not have abdominal pain on examination.  Will check lipase, ammonia, timed troponin.  Initiate IV fluid resuscitation, 1 mg Ativan and place on CIWA protocol.  Patient has no focal neurological deficits on examination.  While I do think ultimately he would benefit from brain MRI, there is no indication that he requires it emergently this morning in the emergency department.  Clinical Course as of Feb 24 628  Fri Feb 23, 2018  [id number] Patient feeling much better.  Tells me he wishes to quit alcohol cold [place].  I cautioned him against doing so.  I did recommend he could drink a little bit less each night.  Will prescribe Ativan to help with tapering off alcohol.  I did also suggest to him to follow-up with his PCP for outpatient brain MRI.  Strict return precautions given.  Patient verbalizes understanding and agrees with plan of care.   [JS]    Clinical Course User Index [JS] Irean HongSung, Jade J, MD     ____________________________________________   FINAL CLINICAL IMPRESSION(S) / ED DIAGNOSES  Final diagnoses:  Syncope, unspecified syncope type  Hypokalemia  Dehydration  Alcohol withdrawal syndrome without  complication [facility](HCC)     ED Discharge Orders         Ordered    LORazepam (ATIVAN) 1 MG tablet  Every 8 hours PRN     02/23/18 0618           Note:  This document was prepared using Dragon voice recognition software and may include unintentional dictation errors.    Irean HongSung, Jade J, MD 02/23/18 [id number]

## 2018-02-23 NOTE — Discharge Instructions (Addendum)
1.  Drink plenty of fluids daily. 2.  You may take Ativan for shakiness if it has been more than 12 hours since your last drink.  Do not stop drinking alcohol cold [place]. 3.  Return to the ER for worsening symptoms, persistent vomiting, difficulty breathing or other concerns.

## 2018-02-27 ENCOUNTER — Telehealth: Payer: Self-pay

## 2018-02-27 NOTE — Telephone Encounter (Signed)
Spoke to pt. He said he is doing a little better. No more syncopal episodes. He has an appt 03-02-18.

## 2018-03-02 ENCOUNTER — Encounter

## 2018-03-02 ENCOUNTER — Encounter: Payer: Self-pay | Admitting: Internal Medicine

## 2018-03-02 ENCOUNTER — Ambulatory Visit: Payer: 59 | Admitting: Internal Medicine

## 2018-03-02 VITALS — BP 130/84 | HR 83 | Temp 98.1°F | Ht 70.0 in | Wt 172.0 lb

## 2018-03-02 DIAGNOSIS — R55 Syncope and collapse: Secondary | ICD-10-CM

## 2018-03-02 DIAGNOSIS — F1029 Alcohol dependence with unspecified alcohol-induced disorder: Secondary | ICD-10-CM

## 2018-03-02 DIAGNOSIS — I679 Cerebrovascular disease, unspecified: Secondary | ICD-10-CM

## 2018-03-02 DIAGNOSIS — F1020 Alcohol dependence, uncomplicated: Secondary | ICD-10-CM | POA: Insufficient documentation

## 2018-03-02 MED ORDER — ATORVASTATIN CALCIUM 20 MG PO TABS: 20.0000 mg | ORAL_TABLET | Freq: Every day | ORAL | 3 refills | Status: DC

## 2018-03-02 NOTE — Progress Notes (Signed)
Subjective:    Patient ID: Levi Mejia, male    DOB: 08-02-1970, 48 y.o.   MRN: [id number]  HPI Here for ER follow up Reviewed the note from ER  Had been drinking a fifth of liquor every 2 days This had been steady so he was not sure that this was the problem Doesn't feel he would get drunk  Has been cutting down his alcohol No whiskey since the ER visit Now down to 1/2 bottle of wine (is weaning himself down) Planning to stop completely  Has some dizziness if he stands up too quick Has to hold on to something Has taken 3 of the lorazepam in mornings (discussed that he should cut them in half)  Worried about the CT findings Small lacunar infarct present and angiopathy  Current Outpatient Medications on File Prior to Visit  Medication Sig Dispense Refill  . Ascorbic Acid (VITAMIN C) 1000 MG tablet Take 1,000 mg by mouth daily.    Marland Kitchen ibuprofen (ADVIL,MOTRIN) 200 MG tablet Take 200 mg by mouth every 6 (six) hours as needed.    Marland Kitchen LORazepam (ATIVAN) 1 MG tablet Take 1 tablet (1 mg total) by mouth every 8 (eight) hours as needed for anxiety. 10 tablet 0  . Multiple Vitamin (MULTIVITAMIN) tablet Take 1 tablet by mouth daily.     No current facility-administered medications on file prior to visit.     No Known Allergies  Past Medical History:  Diagnosis Date  . Attention deficit disorder without mention of hyperactivity     Past Surgical History:  Procedure Laterality Date  . Hand tendon reattachment  1992   Right hand  . toe reattachment  1996   Right 1st toe    Family History  Problem Relation Age of Onset  . Heart attack Father 85  . Hypertension Father   . Coronary artery disease Mother 69       Stent  . Cancer Mother        Bone  . Hypertension Mother   . Hypertension Brother   . Diabetes Neg Hx   . Prostate cancer Neg Hx   . Colon cancer Neg Hx   . Heart disease Neg Hx     Social History   Socioeconomic History  . Marital status: Divorced   Spouse name: Not on file  . Number of children: 1  . Years of education: Not on file  . Highest education level: Not on file  Occupational History  . Occupation: Curator    Comment: Engineering geologist  Social Needs  . Financial resource strain: Not on file  . Food insecurity:    Worry: Not on file    Inability: Not on file  . Transportation needs:    Medical: Not on file    Non-medical: Not on file  Tobacco Use  . Smoking status: Never Smoker  . Smokeless tobacco: Never Used  Substance and Sexual Activity  . Alcohol use: Yes    Comment: Regular  . Drug use: No  . Sexual activity: Not on file  Lifestyle  . Physical activity:    Days per week: Not on file    Minutes per session: Not on file  . Stress: Not on file  Relationships  . Social connections:    Talks on phone: Not on file    Gets together: Not on file    Attends religious service: Not on file    Active member of club or organization: Not on file  Attends meetings of clubs or organizations: Not on file    Relationship status: Not on file  . Intimate partner violence:    Fear of current or ex partner: Not on file    Emotionally abused: Not on file    Physically abused: Not on file    Forced sexual activity: Not on file  Other Topics Concern  . Not on file  Social History Narrative   Divorced   Common law marriage since 2006      1 daughter   Review of Systems Sleeping better Appetite is "up and down" Weight is about his normal (up from his last visit) Hasn't missed work since the ER visit    Objective:   Physical Exam  Constitutional: He appears well-developed.  Neck: No thyromegaly present.  Cardiovascular: Normal rate, regular rhythm and normal heart sounds. Exam reveals no gallop.  No murmur heard. Respiratory: Effort normal and breath sounds normal. No respiratory distress. He has no wheezes. He has no rales.  GI: Soft. There is no abdominal tenderness.  Musculoskeletal:        General: No  tenderness or edema.  Lymphadenopathy:    He has no cervical adenopathy.  Psychiatric: He has a normal mood and affect. His behavior is normal.           Assessment & Plan:

## 2018-03-02 NOTE — Assessment & Plan Note (Signed)
Recurrent Last spell likely alcohol withdrawal Some orthostatic dizziness---but better overall

## 2018-03-02 NOTE — Assessment & Plan Note (Signed)
Urged him to stop completely at this point Has a few lorazepam in case any symptoms

## 2018-03-02 NOTE — Assessment & Plan Note (Signed)
CT abnormalities Will start statin Discussed ASA---will hold off

## 2018-05-21 ENCOUNTER — Ambulatory Visit (INDEPENDENT_AMBULATORY_CARE_PROVIDER_SITE_OTHER): Payer: 59 | Admitting: Internal Medicine

## 2018-05-21 ENCOUNTER — Telehealth: Payer: Self-pay

## 2018-05-21 ENCOUNTER — Encounter: Payer: Self-pay | Admitting: Internal Medicine

## 2018-05-21 ENCOUNTER — Other Ambulatory Visit: Payer: Self-pay

## 2018-05-21 ENCOUNTER — Ambulatory Visit: Payer: 59 | Admitting: Family Medicine

## 2018-05-21 DIAGNOSIS — J069 Acute upper respiratory infection, unspecified: Secondary | ICD-10-CM | POA: Insufficient documentation

## 2018-05-21 MED ORDER — BENZONATATE 200 MG PO CAPS: 200.0000 mg | ORAL_CAPSULE | Freq: Three times a day (TID) | ORAL | 0 refills | Status: DC | PRN

## 2018-05-21 NOTE — Telephone Encounter (Signed)
Okay Will call him at that time to decide on a course of action

## 2018-05-21 NOTE — Telephone Encounter (Signed)
Levi Mejia (DPR signed) pt started with non prod cough for couple of days but last night coughed a lot all night. Pt taking mucinex fast max severe congestion and cough; 2 hrs ago temp 101.3 and just recked temp and still 101.3;No SOB. 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 Also pts daughter was diagnosed 2 wks ago with Flu A. Pt scheduled phone visit with Dr Alphonsus Sias 05/21/18 at 2:45/ (phone note was started due to get v/m initially and then when returned call had to leave a v/m for Levi Mejia to cb).

## 2018-05-21 NOTE — Progress Notes (Signed)
Virtual Visit via Telephone Note  I connected with Levi Mejia on 05/21/18 at 3:04PM by telephone and verified that I am speaking with the correct person using two identifiers.   I discussed the limitations, risks, security and privacy concerns of performing an evaluation and management service by telephone and the availability of in person appointments. I also discussed with the patient that there may be a patient responsible charge related to this service. The patient expressed understanding and agreed to proceed.   History of Present Illness: He is at home currently. I am in my office  He started with a bad cough sometime in the middle of the night. Fever noted ~4:30AM Highest is 101.8 Throat "feels like it is sandpaper" Can't get comfortable----"every way I sit it hurts" Has felt short of breath---when he gets up to walk to the bathroom Cough is dry No clear exposure---but in and out of cars all day (last day of work-- 3/20) One of the cars on 3/19 was a student from [place] who drove home due to school being canceled. She had been sick so her father needed to bring in the car. Stomach feels off---but no N/V Not much appetite but ate some fruit and drank some water   Observations/Objective: Almost constant cough Able to speak normally (other than the cough)--no obvious dyspnea  Assessment and Plan: Acute viral respiratory illness----COVID19 is a possibility Discussed analgesics, rest, fluids Will send Rx for tessalon  Follow Up Instructions: Should call 911 if worsens---like dyspnea at rest He is already isolating himself in his home Visit ended at 3:12PM   I discussed the assessment and treatment plan with the patient. The patient was provided an opportunity to ask questions and all were answered. The patient agreed with the plan and demonstrated an understanding of the instructions.   The patient was advised to call back or seek an in-person evaluation if the symptoms  worsen or if the condition fails to improve as anticipated.  I provided 8 minutes of non-face-to-face time during this encounter. All this information was added my me---- Tillman Abide MD   Eual Fines, CMA  Called pt to get visit info, allergies, and medications.

## 2018-05-21 NOTE — Assessment & Plan Note (Signed)
See phone visit note

## 2018-05-21 NOTE — Telephone Encounter (Signed)
[name] (DPR signed) left v/m pt having cough,fever,nausea and requesting cb. I called back and left v/m requesting cb for additional info.

## 2018-05-22 ENCOUNTER — Telehealth: Payer: Self-pay

## 2018-05-22 DIAGNOSIS — R509 Fever, unspecified: Secondary | ICD-10-CM | POA: Diagnosis not present

## 2018-05-22 DIAGNOSIS — J111 Influenza due to unidentified influenza virus with other respiratory manifestations: Secondary | ICD-10-CM | POA: Diagnosis not present

## 2018-05-22 NOTE — Telephone Encounter (Signed)
FYI: [facility] note received. See other telephone note. This has been addressed already. Patient did not go to ER but went to [facility].  Patient Name: Levi Mejia Gender: Male DOB: [birth year]  Age: 48 Y 8 M 16 D Return Phone Number: [phone] (Primary), [phone] (Secondary) Address:  City/State/Zip: [place]  [address] Client [facility] Night - Client Client Site [facility] - Night Physician Tillman Abide - MD Contact Type Call Who Is Calling Patient / Member / Family / Caregiver Call Type Triage / Clinical Caller Name Karma Lew Relationship To Patient Spouse Return Phone Number [phone] (Primary) Chief Complaint Fever (non urgent symptom) (> THREE MONTHS) Reason for Call Symptomatic / Request for Health Information Initial Comment Caller states her husband feels nauseated, cough, scratchy throat, and a 99.3 fever. He has been taking medication that Doctor prescribed. Translation No Nurse Assessment Nurse: Ralph Leyden, RN, Reuel Boom Date/Time Lamount Cohen Time): 05/21/2018 9:49:07 PM Confirm and document reason for call. If symptomatic, describe symptoms. ---Patient states her husband is nauseated, cough, scratchy throat, and a 101.8 at 1912. Has the patient traveled to [place], [place], [place], [place], or [place] OR had close contact with a person known to have the novel coronavirus illness in the last 14 days? ---No Does the patient have any new or worsening symptoms? ---Yes Will a triage be completed? ---Yes Related visit to physician within the last 2 weeks? ---Yes Does the PT have any chronic conditions? (i.e. diabetes, asthma, this includes High risk factors for pregnancy, etc.) ---No Is this a behavioral health or substance abuse call? ---No Guidelines Guideline Title Affirmed Question Affirmed Notes Nurse Date/Time (Eastern Time) Influenza - Seasonal [1] Difficulty breathing AND [2] not severe AND [3] not from  stuffy nose (e.g., not relieved by cleaning out the nose)  Ralph Leyden, RN, Reuel Boom 05/21/2018 9:53:58 PM Disp. Time Lamount Cohen Time) Disposition Final User 05/21/2018 9:59:42 PM Go to ED Now (or PCP triage) Yes Ralph Leyden, RN, Marveen Reeks NOTE:  All timestamps contained within this report are represented as [place] Standard Time. CONFIDENTIALTY NOTICE: This fax transmission is intended only for the addressee.  It contains information that is legally privileged, confidential or otherwise protected from use or disclosure.  If you are not the intended recipient, you are strictly prohibited from reviewing, disclosing, copying using or disseminating any of this information or taking any action in reliance on or regarding this information.  If you have received this fax in error, please notify us immediately by telephone so that we can arrange for its return to [place]. Phone:  [phone], Toll-Free:  [phone], Fax:  [phone] Page: 2 of 2 Call Id: [id number]     Caller Disagree/Comply Comply Caller Understands Yes PreDisposition Go to ED Care Advice Given Per Guideline GO TO ED NOW (OR PCP TRIAGE): * IF NO PCP (PRIMARY CARE PROVIDER) SECOND-LEVEL TRIAGE: You need to be seen within the next hour. Go to the ED/UCC at _____________ Hospital. Leave as soon as you can. DRIVING: Another adult should drive. CARE ADVICE given per INFLUENZA - SEASONAL (Adult) guideline. Comments User: Anastasia Pall, RN Date/Time Lamount Cohen Time): 05/21/2018 9:53:54 PM Patient seen by a teledoc today and rx Benzonatate. Referrals [facility] - ED

## 2018-05-22 NOTE — Telephone Encounter (Signed)
Left a detailed message on VM per DPR. Advised him to let us know if he needed anything for nausea and I would send it in.

## 2018-05-22 NOTE — Telephone Encounter (Signed)
Received a call with an update on patient. Patient had a telephone visit with Dr Alphonsus Sias yesterday-05/21/2018, later on yesterday after our office was closed patient developed nausea/feeling sick to his stomach and called after hours nurse. He was advised to go to ER. He did not go. He did go to [facility] in [place] today. He was tested for flu and it was negative but he was started on Tamiflu still and has picked this up. Patient is not nauseas today just does not feel well. Temp this morning before going to urgent care was 99ish, coughing some still. Wanted to let Dr. Alphonsus Sias know an update. CB to patient is [phone]

## 2018-05-22 NOTE — Telephone Encounter (Signed)
Okay Not sure the tamiflu makes sense if the flu test was negative. Glad his nausea is better Still sounds like some type of viral infection----may be at least worth the try with the tamilfu. If he still has nausea, okay to send ondansetron 4mg  tid prn (#15 x 0)

## 2018-06-20 ENCOUNTER — Encounter: Payer: Self-pay | Admitting: Internal Medicine

## 2018-06-20 ENCOUNTER — Inpatient Hospital Stay
Admission: EM | Admit: 2018-06-20 | Discharge: 2018-06-22 | DRG: 897 | Disposition: A | Discharge status: Home / Self Care | Payer: 59 | Attending: Internal Medicine | Admitting: Internal Medicine

## 2018-06-20 ENCOUNTER — Ambulatory Visit (INDEPENDENT_AMBULATORY_CARE_PROVIDER_SITE_OTHER): Payer: 59 | Admitting: Internal Medicine

## 2018-06-20 ENCOUNTER — Other Ambulatory Visit: Payer: Self-pay

## 2018-06-20 VITALS — BP 198/114 | HR 134 | Temp 99.5°F | Wt 160.0 lb

## 2018-06-20 DIAGNOSIS — F10239 Alcohol dependence with withdrawal, unspecified: Principal | ICD-10-CM | POA: Diagnosis present

## 2018-06-20 DIAGNOSIS — E876 Hypokalemia: Secondary | ICD-10-CM | POA: Diagnosis present

## 2018-06-20 DIAGNOSIS — E871 Hypo-osmolality and hyponatremia: Secondary | ICD-10-CM | POA: Diagnosis present

## 2018-06-20 DIAGNOSIS — E872 Acidosis: Secondary | ICD-10-CM | POA: Diagnosis present

## 2018-06-20 DIAGNOSIS — K7010 Alcoholic hepatitis without ascites: Secondary | ICD-10-CM | POA: Diagnosis present

## 2018-06-20 DIAGNOSIS — F10939 Alcohol use, unspecified with withdrawal, unspecified: Secondary | ICD-10-CM | POA: Insufficient documentation

## 2018-06-20 DIAGNOSIS — F1020 Alcohol dependence, uncomplicated: Secondary | ICD-10-CM | POA: Diagnosis present

## 2018-06-20 DIAGNOSIS — E785 Hyperlipidemia, unspecified: Secondary | ICD-10-CM | POA: Diagnosis present

## 2018-06-20 DIAGNOSIS — I10 Essential (primary) hypertension: Secondary | ICD-10-CM | POA: Diagnosis present

## 2018-06-20 DIAGNOSIS — E878 Other disorders of electrolyte and fluid balance, not elsewhere classified: Secondary | ICD-10-CM | POA: Diagnosis present

## 2018-06-20 DIAGNOSIS — Z79899 Other long term (current) drug therapy: Secondary | ICD-10-CM

## 2018-06-20 LAB — CBC WITH DIFFERENTIAL/PLATELET
Abs Immature Granulocytes: 0.04 10*3/uL (ref 0.00–0.07)
Basophils Absolute: 0.1 10*3/uL (ref 0.0–0.1)
Basophils Relative: 1 %
Eosinophils Absolute: 0.5 10*3/uL (ref 0.0–0.5)
Eosinophils Relative: 5 %
HCT: 47.8 % (ref 39.0–52.0)
Hemoglobin: 16.9 g/dL (ref 13.0–17.0)
Immature Granulocytes: 0 %
Lymphocytes Relative: 34 %
Lymphs Abs: 3.4 10*3/uL (ref 0.7–4.0)
MCH: 33.5 pg (ref 26.0–34.0)
MCHC: 35.4 g/dL (ref 30.0–36.0)
MCV: 94.7 fL (ref 80.0–100.0)
Monocytes Absolute: 0.7 10*3/uL (ref 0.1–1.0)
Monocytes Relative: 7 %
Neutro Abs: 5.2 10*3/uL (ref 1.7–7.7)
Neutrophils Relative %: 53 %
Platelets: 150 10*3/uL (ref 150–400)
RBC: 5.05 MIL/uL (ref 4.22–5.81)
RDW: 11.4 % — ABNORMAL LOW (ref 11.5–15.5)
WBC: 9.9 10*3/uL (ref 4.0–10.5)
nRBC: 0 % (ref 0.0–0.2)

## 2018-06-20 LAB — COMPREHENSIVE METABOLIC PANEL
ALT: 93 U/L — ABNORMAL HIGH (ref 0–44)
AST: 203 U/L — ABNORMAL HIGH (ref 15–41)
Albumin: 5.1 g/dL — ABNORMAL HIGH (ref 3.5–5.0)
Alkaline Phosphatase: 92 U/L (ref 38–126)
Anion gap: 18 — ABNORMAL HIGH (ref 5–15)
BUN: 7 mg/dL (ref 6–20)
CO2: 26 mmol/L (ref 22–32)
Calcium: 9.2 mg/dL (ref 8.9–10.3)
Chloride: 89 mmol/L — ABNORMAL LOW (ref 98–111)
Creatinine, Ser: 1.04 mg/dL (ref 0.61–1.24)
GFR calc Af Amer: >60 mL/min (ref >60)
GFR calc non Af Amer: >60 mL/min (ref >60)
Glucose, Bld: 137 mg/dL — ABNORMAL HIGH (ref 70–99)
Potassium: 2.9 mmol/L — ABNORMAL LOW (ref 3.5–5.1)
Sodium: 133 mmol/L — ABNORMAL LOW (ref 135–145)
Total Bilirubin: 2.3 mg/dL — ABNORMAL HIGH (ref 0.3–1.2)
Total Protein: 9.3 g/dL — ABNORMAL HIGH (ref 6.5–8.1)

## 2018-06-20 LAB — URINE DRUG SCREEN, QUALITATIVE (ARMC ONLY)
Amphetamines, Ur Screen: NOT DETECTED
Barbiturates, Ur Screen: NOT DETECTED
Benzodiazepine, Ur Scrn: NOT DETECTED
Cannabinoid 50 Ng, Ur ~~LOC~~: NOT DETECTED
Cocaine Metabolite,Ur ~~LOC~~: NOT DETECTED
MDMA (Ecstasy)Ur Screen: NOT DETECTED
Methadone Scn, Ur: NOT DETECTED
Opiate, Ur Screen: NOT DETECTED
Phencyclidine (PCP) Ur S: NOT DETECTED
Tricyclic, Ur Screen: NOT DETECTED

## 2018-06-20 MED ORDER — LORAZEPAM 2 MG PO TABS
0.0000 mg | ORAL_TABLET | Freq: Four times a day (QID) | ORAL | Status: DC
  Filled 2018-06-20: qty 1

## 2018-06-20 MED ORDER — LORAZEPAM 2 MG/ML IJ SOLN
0.0000 mg | Freq: Four times a day (QID) | INTRAMUSCULAR | Status: DC
  Administered 2018-06-20: 2 mg via INTRAVENOUS
  Filled 2018-06-20 (×2): qty 1

## 2018-06-20 MED ORDER — CHLORDIAZEPOXIDE HCL 25 MG PO CAPS: 25.0000 mg | ORAL_CAPSULE | ORAL | 0 refills | Status: DC | PRN

## 2018-06-20 MED ORDER — LORAZEPAM 2 MG/ML IJ SOLN: 0.0000 mg | Freq: Two times a day (BID) | INTRAMUSCULAR | Status: DC

## 2018-06-20 MED ORDER — VITAMIN B-1 100 MG PO TABS
100.0000 mg | ORAL_TABLET | Freq: Every day | ORAL | Status: DC
  Filled 2018-06-20: qty 1

## 2018-06-20 MED ORDER — LORAZEPAM 2 MG PO TABS: 0.0000 mg | ORAL_TABLET | Freq: Two times a day (BID) | ORAL | Status: DC

## 2018-06-20 MED ORDER — THIAMINE HCL 100 MG/ML IJ SOLN: 100.0000 mg | Freq: Every day | INTRAMUSCULAR | Status: DC

## 2018-06-20 NOTE — ED Provider Notes (Signed)
[facility] Emergency Department Provider Note    First MD Initiated Contact with Patient 06/20/18 2324     (approximate)  I have reviewed the triage vital signs and the nursing notes.   HISTORY  Chief Complaint Withdrawal    HPI Levi Mejia is a 48 y.o. male with medical history as listed below with longstanding history of alcohol abuse presents to the emergency department concerns for alcohol withdrawal.  Patient states that his last drink was Monday morning.  Patient states that he also has been trying to get into a detox facility however they are not accepting new patient secondary to COVID-19.  Patient admits to feelings of confusion this evening rapid heartbeat and hypertension as noted on home monitor.  Patient presents emergency department tachycardic hypertensive and tremulous.  Patient denies any previous history of seizure or alcohol withdrawal.  Of note the patient's primary care provider did prescribe Librium which patient states that he took a dose.        Past Medical History:  Diagnosis Date  . Attention deficit disorder without mention of hyperactivity     Patient Active Problem List   Diagnosis Date Noted  . Alcohol withdrawal (HCC) 06/20/2018  . Viral URI 05/21/2018  . Alcohol dependence (HCC) 03/02/2018  . Cerebrovascular disease 03/02/2018  . Syncope 02/02/2018  . Preventative health care 09/30/2016  . Sleep disorder 09/30/2016  . Essential hypertension, benign 08/23/2011  . BACK PAIN, LUMBAR 07/17/2008    Past Surgical History:  Procedure Laterality Date  . Hand tendon reattachment  1992   Right hand  . toe reattachment  1996   Right 1st toe    Prior to Admission medications   Medication Sig Start Date End Date Taking? Authorizing Provider  Ascorbic Acid (VITAMIN C) 1000 MG tablet Take 1,000 mg by mouth daily.    [provider]  atorvastatin (LIPITOR) 20 MG tablet Take 1 tablet (20 mg total) by mouth  daily. 03/02/18   Karie Schwalbe, MD  chlordiazePOXIDE (LIBRIUM) 25 MG capsule Take 1 capsule (25 mg total) by mouth every 4 (four) hours as needed for anxiety. 06/20/18   Karie Schwalbe, MD  ibuprofen (ADVIL,MOTRIN) 200 MG tablet Take 200 mg by mouth every 6 (six) hours as needed.    [provider]  LORazepam (ATIVAN) 1 MG tablet Take 1 tablet (1 mg total) by mouth every 8 (eight) hours as needed for anxiety. Patient not taking: Reported on 05/21/2018 02/23/18   Irean Hong, MD  Melatonin 10 MG CAPS Take by mouth.    [provider]    Allergies Patient has no known allergies.  Family History  Problem Relation Age of Onset  . Heart attack Father 55  . Hypertension Father   . Coronary artery disease Mother 37       Stent  . Cancer Mother        Bone  . Hypertension Mother   . Hypertension Brother   . Diabetes Neg Hx   . Prostate cancer Neg Hx   . Colon cancer Neg Hx   . Heart disease Neg Hx     Social History Social History   Tobacco Use  . Smoking status: Never Smoker  . Smokeless tobacco: Never Used  Substance Use Topics  . Alcohol use: Yes    Comment: Regular  . Drug use: No    Review of Systems Constitutional: No fever/chills Eyes: No visual changes. ENT: No sore throat. Cardiovascular: Denies chest  pain. Respiratory: Denies shortness of breath. Gastrointestinal: No abdominal pain.  No nausea, no vomiting.  No diarrhea.  No constipation. Genitourinary: Negative for dysuria. Musculoskeletal: Negative for neck pain.  Negative for back pain. Integumentary: Negative for rash. Neurological: Negative for headaches, focal weakness or numbness. Psychiatric:  Positive for alcohol withdrawal   ____________________________________________   PHYSICAL EXAM:  VITAL SIGNS: ED Triage Vitals  Enc Vitals Group     BP 06/20/18 2210 (!) 155/95     Pulse Rate 06/20/18 2210 (!) 136     Resp 06/20/18 2210 20     Temp 06/20/18 2210 98.9 F (37.2 C)      Temp Source 06/20/18 2210 Oral     SpO2 06/20/18 2210 99 %     Weight 06/20/18 2209 72.6 kg (160 lb)     Height 06/20/18 2209 1.829 m (6')     Head Circumference --      Peak Flow --      Pain Score --      Pain Loc --      Pain Edu? --      Excl. in GC? --      Constitutional: Alert and oriented. Well appearing and in no acute distress. Eyes: Conjunctivae are normal. PERRL. EOMI. Mouth/Throat: Mucous membranes are moist. Oropharynx non-erythematous. Neck: No stridor.   Cardiovascular: Tachycardia, regular rhythm. Good peripheral circulation. Grossly normal heart sounds. Respiratory: Normal respiratory effort.  No retractions. No audible wheezing. Gastrointestinal: Soft and nontender. No distention.  Musculoskeletal: No lower extremity tenderness nor edema. No gross deformities of extremities. Neurologic:  Normal speech and language. No gross focal neurologic deficits are appreciated.  Skin:  Skin is warm, dry and intact. No rash noted. Psychiatric: Mood and affect are normal. Speech and behavior are normal.  ____________________________________________   LABS (all labs ordered are listed, but only abnormal results are displayed)  Labs Reviewed  CBC WITH DIFFERENTIAL/PLATELET - Abnormal; Notable for the following components:      Result Value   RDW 11.4 (*)    All other components within normal limits  COMPREHENSIVE METABOLIC PANEL - Abnormal; Notable for the following components:   Sodium 133 (*)    Potassium 2.9 (*)    Chloride 89 (*)    Glucose, Bld 137 (*)    Total Protein 9.3 (*)    Albumin 5.1 (*)    AST 203 (*)    ALT 93 (*)    Total Bilirubin 2.3 (*)    Anion gap 18 (*)    All other components within normal limits  URINE DRUG SCREEN, QUALITATIVE ([facility] ONLY)    Procedures  ED ECG REPORT I, Porter N BROWN, the attending physician, personally viewed and interpreted this ECG.   Date: 06/20/2018  EKG Time: 10:21 PM  Rate: 120  Rhythm: Sinus  tachycardia  Axis: Normal  Intervals: normal  ST&T Change: None  ____________________________________________   INITIAL IMPRESSION / MDM / ASSESSMENT AND PLAN / ED COURSE  As part of my medical decision making, I reviewed the following data within the electronic MEDICAL RECORD NUMBER   -year-old male presented with above-stated history and physical exam secondary to alcohol withdrawal.  Patient received Ativan 2 mg IV x2 doses as per CIWA protocol.  Patient discussed with Dr. Allena KatzPatel for hospital admission for further evaluation and management.  Henderson BaltimoreMichael Alfieri was evaluated in Emergency Department on 06/20/2018 for the symptoms described in the history of present illness. He was evaluated in the context of the global  COVID-19 pandemic, which necessitated consideration that the patient might be at risk for infection with the SARS-CoV-2 virus that causes COVID-19. Institutional protocols and algorithms that pertain to the evaluation of patients at risk for COVID-19 are in a state of rapid change based on information released by regulatory bodies including the CDC and federal and state organizations. These policies and algorithms were followed during the patient's care in the ED.   ____________________________________________  FINAL CLINICAL IMPRESSION(S) / ED DIAGNOSES  Final diagnoses:  Alcohol withdrawal syndrome with complication (HCC)     MEDICATIONS GIVEN DURING THIS VISIT:  Medications  LORazepam (ATIVAN) injection 0-4 mg (2 mg Intravenous Given 06/20/18 2245)    Or  LORazepam (ATIVAN) tablet 0-4 mg ( Oral See Alternative 06/20/18 2245)  LORazepam (ATIVAN) injection 0-4 mg (has no administration in time range)    Or  LORazepam (ATIVAN) tablet 0-4 mg (has no administration in time range)  thiamine (VITAMIN B-1) tablet 100 mg (has no administration in time range)    Or  thiamine (B-1) injection 100 mg (has no administration in time range)     ED Discharge Orders    None        Note:  This document was prepared using Dragon voice recognition software and may include unintentional dictation errors.   Darci Current, MD 06/20/18 2352

## 2018-06-20 NOTE — ED Notes (Signed)
Pt denies CP, SHOB, N/V at this time.

## 2018-06-20 NOTE — Progress Notes (Signed)
Subjective:    Patient ID: Levi Mejia, male    DOB: 11/06/1970, 48 y.o.   MRN: [id number]  HPI Virtual visit for review of apparent alcohol withdrawal symptoms Identification done Reviewed billing and consent given He is in his home and I am in my office His wife is there also  Stopped drinking 2 days ago--- but not completely Wife cut him down from 3 bottles of wine to 1 daily (wife trying to wean him off) But had 4 daily the 2 days before that  His blood pressure shot up today Eyes dilated Very shaky Mind was not working, hot flashes Wife gave him a little wine this morning---this helped him quite a bit This really scared him---he has had withdrawal before but never affected his mind before  He is stopping along with another man at work They are off work now until Monday Wife has contacted outpatient treatment center ([facility] in [place])  Current Outpatient Medications on File Prior to Visit  Medication Sig Dispense Refill  . Ascorbic Acid (VITAMIN C) 1000 MG tablet Take 1,000 mg by mouth daily.    Marland Kitchen. atorvastatin (LIPITOR) 20 MG tablet Take 1 tablet (20 mg total) by mouth daily. 90 tablet 3  . Melatonin 10 MG CAPS Take by mouth.    Marland Kitchen. ibuprofen (ADVIL,MOTRIN) 200 MG tablet Take 200 mg by mouth every 6 (six) hours as needed.    Marland Kitchen. LORazepam (ATIVAN) 1 MG tablet Take 1 tablet (1 mg total) by mouth every 8 (eight) hours as needed for anxiety. (Patient not taking: Reported on 05/21/2018) 10 tablet 0   No current facility-administered medications on file prior to visit.     No Known Allergies  Past Medical History:  Diagnosis Date  . Attention deficit disorder without mention of hyperactivity     Past Surgical History:  Procedure Laterality Date  . Hand tendon reattachment  1992   Right hand  . toe reattachment  1996   Right 1st toe    Family History  Problem Relation Age of Onset  . Heart attack Father 5055  . Hypertension Father   . Coronary artery  disease Mother [id number]       Stent  . Cancer Mother        Bone  . Hypertension Mother   . Hypertension Brother   . Diabetes Neg Hx   . Prostate cancer Neg Hx   . Colon cancer Neg Hx   . Heart disease Neg Hx     Social History   Socioeconomic History  . Marital status: Divorced    Spouse name: Not on file  . Number of children: 1  . Years of education: Not on file  . Highest education level: Not on file  Occupational History  . Occupation: CuratorMechanic    Comment: [employer]  Social Needs  . Financial resource strain: Not on file  . Food insecurity:    Worry: Not on file    Inability: Not on file  . Transportation needs:    Medical: Not on file    Non-medical: Not on file  Tobacco Use  . Smoking status: Never Smoker  . Smokeless tobacco: Never Used  Substance and Sexual Activity  . Alcohol use: Yes    Comment: Regular  . Drug use: No  . Sexual activity: Not on file  Lifestyle  . Physical activity:    Days per week: Not on file    Minutes per session: Not on file  .  Stress: Not on file  Relationships  . Social connections:    Talks on phone: Not on file    Gets together: Not on file    Attends religious service: Not on file    Active member of club or organization: Not on file    Attends meetings of clubs or organizations: Not on file    Relationship status: Not on file  . Intimate partner violence:    Fear of current or ex partner: Not on file    Emotionally abused: Not on file    Physically abused: Not on file    Forced sexual activity: Not on file  Other Topics Concern  . Not on file  Social History Narrative   Divorced   Common law marriage since 2006      1 daughter   Review of Systems No chest pain  No SOB Appetite is down Did vomit 2 days ago--only once. No blood    Objective:   Physical Exam  Constitutional: He is oriented to person, place, and time. He appears well-developed. No distress.  Neurological: He is alert and oriented to person,  place, and time.  Normal interaction No tremors  Psychiatric: He has a normal mood and affect. His behavior is normal.           Assessment & Plan:

## 2018-06-20 NOTE — ED Triage Notes (Signed)
Pt is detoxing from alcohol since Monday. PMD called in librium yesterday because he started feeling anxious. Here today due to elevated HR states 160 at home and increased BP. Pt here for help with symptoms does not request psychiatry.

## 2018-06-20 NOTE — Assessment & Plan Note (Signed)
It is great that he has decided to stop Using another alcoholic to support him---discussed that this is not adequate. Wife has reached out for an outpatient center [facility] the name of [facility] (I think they also do outpatient) Reviewed need to stop alcohol completely Will Rx chlordiazepoxide---wife will hold and administer. She will fill and give him 1 when she gets home with it, or 2 if he is having symptoms again. Discussed need to dose frequently at first---then decreasing need over the next 48 hours I will plan to see him again in 2 days Discussed criteria for calling 911

## 2018-06-21 ENCOUNTER — Encounter: Payer: 59 | Admitting: Internal Medicine

## 2018-06-21 ENCOUNTER — Other Ambulatory Visit: Payer: Self-pay

## 2018-06-21 DIAGNOSIS — E878 Other disorders of electrolyte and fluid balance, not elsewhere classified: Secondary | ICD-10-CM | POA: Diagnosis present

## 2018-06-21 DIAGNOSIS — F1029 Alcohol dependence with unspecified alcohol-induced disorder: Secondary | ICD-10-CM

## 2018-06-21 DIAGNOSIS — F10239 Alcohol dependence with withdrawal, unspecified: Secondary | ICD-10-CM | POA: Diagnosis present

## 2018-06-21 DIAGNOSIS — Z79899 Other long term (current) drug therapy: Secondary | ICD-10-CM | POA: Diagnosis not present

## 2018-06-21 DIAGNOSIS — E785 Hyperlipidemia, unspecified: Secondary | ICD-10-CM | POA: Diagnosis present

## 2018-06-21 DIAGNOSIS — E871 Hypo-osmolality and hyponatremia: Secondary | ICD-10-CM | POA: Diagnosis not present

## 2018-06-21 DIAGNOSIS — K7010 Alcoholic hepatitis without ascites: Secondary | ICD-10-CM | POA: Diagnosis present

## 2018-06-21 DIAGNOSIS — E876 Hypokalemia: Secondary | ICD-10-CM | POA: Diagnosis present

## 2018-06-21 DIAGNOSIS — I10 Essential (primary) hypertension: Secondary | ICD-10-CM | POA: Diagnosis present

## 2018-06-21 DIAGNOSIS — E872 Acidosis: Secondary | ICD-10-CM | POA: Diagnosis not present

## 2018-06-21 LAB — BASIC METABOLIC PANEL
Anion gap: 11 (ref 5–15)
BUN: 8 mg/dL (ref 6–20)
CO2: 26 mmol/L (ref 22–32)
Calcium: 8.4 mg/dL — ABNORMAL LOW (ref 8.9–10.3)
Chloride: 101 mmol/L (ref 98–111)
Creatinine, Ser: 0.99 mg/dL (ref 0.61–1.24)
GFR calc Af Amer: >60 mL/min (ref >60)
GFR calc non Af Amer: >60 mL/min (ref >60)
Glucose, Bld: 99 mg/dL (ref 70–99)
Potassium: 3.1 mmol/L — ABNORMAL LOW (ref 3.5–5.1)
Sodium: 138 mmol/L (ref 135–145)

## 2018-06-21 LAB — MAGNESIUM: Magnesium: 1.5 mg/dL — ABNORMAL LOW (ref 1.7–2.4)

## 2018-06-21 LAB — PHOSPHORUS: Phosphorus: 3.9 mg/dL (ref 2.5–4.6)

## 2018-06-21 MED ORDER — THIAMINE HCL 100 MG/ML IJ SOLN
Freq: Once | INTRAVENOUS | Status: AC
  Administered 2018-06-21: 03:00:00 via INTRAVENOUS
  Filled 2018-06-21: qty 1000

## 2018-06-21 MED ORDER — CLONIDINE HCL 0.1 MG PO TABS
0.1000 mg | ORAL_TABLET | Freq: Two times a day (BID) | ORAL | Status: DC
  Administered 2018-06-21 – 2018-06-22 (×4): 0.1 mg via ORAL
  Filled 2018-06-21 (×4): qty 1

## 2018-06-21 MED ORDER — VITAMIN B-1 100 MG PO TABS
100.0000 mg | ORAL_TABLET | Freq: Every day | ORAL | Status: DC
  Administered 2018-06-22: 09:00:00 100 mg via ORAL
  Filled 2018-06-21: qty 1

## 2018-06-21 MED ORDER — SODIUM CHLORIDE 0.9 % IV BOLUS
1000.0000 mL | Freq: Once | INTRAVENOUS | Status: AC
  Administered 2018-06-21: 1000 mL via INTRAVENOUS

## 2018-06-21 MED ORDER — POTASSIUM CHLORIDE IN NACL 40-0.9 MEQ/L-% IV SOLN
INTRAVENOUS | Status: DC
  Administered 2018-06-21 (×3): 100 mL/h via INTRAVENOUS
  Filled 2018-06-21 (×5): qty 1000

## 2018-06-21 MED ORDER — HYDROXYZINE HCL 25 MG PO TABS: 25.0000 mg | ORAL_TABLET | Freq: Four times a day (QID) | ORAL | Status: DC | PRN

## 2018-06-21 MED ORDER — LORAZEPAM 1 MG PO TABS: 1.0000 mg | ORAL_TABLET | Freq: Every day | ORAL | Status: DC

## 2018-06-21 MED ORDER — FOLIC ACID 1 MG PO TABS
1.0000 mg | ORAL_TABLET | Freq: Every day | ORAL | Status: DC
  Administered 2018-06-22: 1 mg via ORAL
  Filled 2018-06-21 (×2): qty 1

## 2018-06-21 MED ORDER — LORAZEPAM 1 MG PO TABS: 1.0000 mg | ORAL_TABLET | Freq: Three times a day (TID) | ORAL | Status: DC | PRN

## 2018-06-21 MED ORDER — ONDANSETRON HCL 4 MG/2ML IJ SOLN: 4.0000 mg | Freq: Four times a day (QID) | INTRAMUSCULAR | Status: DC | PRN

## 2018-06-21 MED ORDER — ADULT MULTIVITAMIN W/MINERALS CH: 1.0000 | ORAL_TABLET | Freq: Every day | ORAL | Status: DC

## 2018-06-21 MED ORDER — LORAZEPAM 1 MG PO TABS: 1.0000 mg | ORAL_TABLET | Freq: Four times a day (QID) | ORAL | Status: DC | PRN

## 2018-06-21 MED ORDER — ACETAMINOPHEN 325 MG PO TABS: 650.0000 mg | ORAL_TABLET | Freq: Four times a day (QID) | ORAL | Status: DC | PRN

## 2018-06-21 MED ORDER — LORAZEPAM 1 MG PO TABS: 1.0000 mg | ORAL_TABLET | Freq: Two times a day (BID) | ORAL | Status: DC

## 2018-06-21 MED ORDER — ADULT MULTIVITAMIN W/MINERALS CH
1.0000 | ORAL_TABLET | Freq: Every day | ORAL | Status: DC
  Administered 2018-06-22: 1 via ORAL
  Filled 2018-06-21 (×2): qty 1

## 2018-06-21 MED ORDER — POLYETHYLENE GLYCOL 3350 17 G PO PACK: 17.0000 g | PACK | Freq: Every day | ORAL | Status: DC | PRN

## 2018-06-21 MED ORDER — ATORVASTATIN CALCIUM 20 MG PO TABS: 20.0000 mg | ORAL_TABLET | Freq: Every day | ORAL | Status: DC

## 2018-06-21 MED ORDER — HYDRALAZINE HCL 20 MG/ML IJ SOLN: 10.0000 mg | Freq: Four times a day (QID) | INTRAMUSCULAR | Status: DC | PRN

## 2018-06-21 MED ORDER — LORAZEPAM 1 MG PO TABS: 1.0000 mg | ORAL_TABLET | Freq: Three times a day (TID) | ORAL | Status: DC

## 2018-06-21 MED ORDER — LORAZEPAM 1 MG PO TABS
1.0000 mg | ORAL_TABLET | Freq: Three times a day (TID) | ORAL | Status: AC
  Administered 2018-06-21: 21:00:00 1 mg via ORAL
  Filled 2018-06-21: qty 1

## 2018-06-21 MED ORDER — ONDANSETRON HCL 4 MG PO TABS: 4.0000 mg | ORAL_TABLET | Freq: Four times a day (QID) | ORAL | Status: DC | PRN

## 2018-06-21 MED ORDER — LOPERAMIDE HCL 2 MG PO CAPS: 2.0000 mg | ORAL_CAPSULE | ORAL | Status: DC | PRN

## 2018-06-21 MED ORDER — ENSURE ENLIVE PO LIQD: 237.0000 mL | Freq: Two times a day (BID) | ORAL | Status: DC

## 2018-06-21 MED ORDER — LORAZEPAM 1 MG PO TABS
1.0000 mg | ORAL_TABLET | Freq: Four times a day (QID) | ORAL | Status: DC
  Administered 2018-06-21 (×2): 1 mg via ORAL
  Filled 2018-06-21 (×2): qty 1

## 2018-06-21 MED ORDER — ENOXAPARIN SODIUM 40 MG/0.4ML ~~LOC~~ SOLN
40.0000 mg | SUBCUTANEOUS | Status: DC
  Administered 2018-06-21 (×2): 40 mg via SUBCUTANEOUS
  Filled 2018-06-21 (×2): qty 0.4

## 2018-06-21 MED ORDER — LORAZEPAM 1 MG PO TABS
1.0000 mg | ORAL_TABLET | Freq: Two times a day (BID) | ORAL | Status: DC
  Administered 2018-06-22: 1 mg via ORAL
  Filled 2018-06-21: qty 1

## 2018-06-21 MED ORDER — ONDANSETRON 4 MG PO TBDP
4.0000 mg | ORAL_TABLET | Freq: Four times a day (QID) | ORAL | Status: DC | PRN
  Filled 2018-06-21: qty 1

## 2018-06-21 MED ORDER — MAGNESIUM SULFATE 2 GM/50ML IV SOLN
2.0000 g | Freq: Once | INTRAVENOUS | Status: AC
  Administered 2018-06-21: 14:00:00 2 g via INTRAVENOUS
  Filled 2018-06-21: qty 50

## 2018-06-21 NOTE — ED Notes (Signed)
This RN contacted pharmacy about missing medication. Pharmacy informed this RN that they would send medication shortly.

## 2018-06-21 NOTE — Progress Notes (Signed)
[facility] [place] [facility]   PATIENT NAME: Levi Mejia    MR#:  [id number]  DATE OF BIRTH:  1970/06/01  SUBJECTIVE:  CHIEF COMPLAINT:   Chief Complaint  Patient presents with  . Withdrawal  Patient seen and evaluated today Decreased tremors upper extremities Tolerating diet okay No chest pain  REVIEW OF SYSTEMS:    ROS  CONSTITUTIONAL: No documented fever. Has fatigue, weakness. No weight gain, no weight loss.  EYES: No blurry or double vision.  ENT: No tinnitus. No postnasal drip. No redness of the oropharynx.  RESPIRATORY: No cough, no wheeze, no hemoptysis. No dyspnea.  CARDIOVASCULAR: No chest pain. No orthopnea. No palpitations. No syncope.  GASTROINTESTINAL: No nausea, no vomiting or diarrhea. No abdominal pain. No melena or hematochezia.  GENITOURINARY: No dysuria or hematuria.  ENDOCRINE: No polyuria or nocturia. No heat or cold intolerance.  HEMATOLOGY: No anemia. No bruising. No bleeding.  INTEGUMENTARY: No rashes. No lesions.  MUSCULOSKELETAL: No arthritis. No swelling. No gout.  Tremors upper extremities NEUROLOGIC: No numbness, tingling, or ataxia. No seizure-type activity.  PSYCHIATRIC: No anxiety. No insomnia. No ADD.   DRUG ALLERGIES:  No Known Allergies  VITALS:  Blood pressure 124/86, pulse 89, temperature 98.9 F (37.2 C), temperature source Oral, resp. rate 18, height 6' (1.829 m), weight 73.5 kg, SpO2 96 %.  PHYSICAL EXAMINATION:   Physical Exam  GENERAL:  48 y.o.-year-old patient lying in the bed with no acute distress.  EYES: Pupils equal, round, reactive to light and accommodation. No scleral icterus. Extraocular muscles intact.  HEENT: Head atraumatic, normocephalic. Oropharynx and nasopharynx clear.  NECK:  Supple, no jugular venous distention. No thyroid enlargement, no tenderness.  LUNGS: Normal breath sounds bilaterally, no wheezing, rales, rhonchi. No use of accessory muscles of respiration.   CARDIOVASCULAR: S1, S2 normal. No murmurs, rubs, or gallops.  ABDOMEN: Soft, nontender, nondistended. Bowel sounds present. No organomegaly or mass.  EXTREMITIES: No cyanosis, clubbing or edema b/l.    Tremors NEUROLOGIC: Cranial nerves II through XII are intact. No focal Motor or sensory deficits b/l.   PSYCHIATRIC: The patient is alert and oriented x 3.  SKIN: No obvious rash, lesion, or ulcer.   LABORATORY PANEL:   CBC Recent Labs  Lab 06/20/18 2233  WBC 9.9  HGB 16.9  HCT 47.8  PLT 150   ------------------------------------------------------------------------------------------------------------------ Chemistries  Recent Labs  Lab 06/20/18 2233 06/21/18 0320  NA 133* 138  K 2.9* 3.1*  CL 89* 101  CO2 26 26  GLUCOSE 137* 99  BUN 7 8  CREATININE 1.04 0.99  CALCIUM 9.2 8.4*  MG 1.5*  --   AST 203*  --   ALT 93*  --   ALKPHOS 92  --   BILITOT 2.3*  --    ------------------------------------------------------------------------------------------------------------------  Cardiac Enzymes No results for input(s): TROPONINI in the last 168 hours. ------------------------------------------------------------------------------------------------------------------  RADIOLOGY:  No results found.   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  Management plans discussed with the patient, family and they are in agreement.  CODE STATUS: Full code  DVT Prophylaxis: SCDs  TOTAL TIME TAKING CARE OF THIS PATIENT: 36 minutes.   POSSIBLE D/C IN 2 to 3 DAYS, DEPENDING ON CLINICAL CONDITION.  [name] Latarra Eagleton M.D on 06/21/2018 at 10:15 AM  Between 7am to 6pm - Pager - [phone]  After 6pm go to [email or web address] - password [email or web address]  [facility]  Office  [phone]  CC: Primary care physician; Karie SchwalbeLetvak, Richard I, MD  Note: This dictation was prepared with Dragon dictation along with smaller phrase technology. Any transcriptional errors that result from this process are unintentional.

## 2018-06-21 NOTE — Progress Notes (Addendum)
Initial Nutrition Assessment  DOCUMENTATION CODES:   Not applicable  INTERVENTION:   Ensure Enlive po BID, each supplement provides 350 kcal and 20 grams of protein  MVI, folic acid and thiamine in setting of etoh abuse  Pt at high refeed risk; recommend monitor K, P and Mg labs daily until stable    NUTRITION DIAGNOSIS:   Increased nutrient needs related to social / environmental circumstances(etoh abuse ) as evidenced by increased estimated needs  GOAL:   Patient will meet greater than or equal to 90% of their needs  MONITOR:   PO intake, Supplement acceptance, Labs, Weight trends, I & O's, Skin  REASON FOR ASSESSMENT:   Consult Assessment of nutrition requirement/status  ASSESSMENT:   48 y.o. male with medical history as listed below with longstanding history of alcohol abuse presents to the emergency department concerns for alcohol withdrawal.  RD working remotely.  Per chart review, pt with poor appetite and oral intake pta r/t etoh abuse. Pt's appetite is improving; pt eating 100% of meals in hospital. Per chart, pt appears fairly weight stable pta. RD will add supplements to help pt meet his estimated needs. Pt on CIWA. Suspect pt at high refeed risk; recommend monitor K, Mg and P daily until stable.   Medications reviewed and include: lovenox, folic acid, thiamine, MVI, NaCl w/ KCl @100ml /hr, Mg oxide  Labs reviewed: K 3.1(L), Mg 1.5(L), P 3.9 wnl AST 203(H), ALT 93(H) Ammonia <9(L)  Unable to complete Nutrition-Focused physical exam at this time.   Diet Order:   Diet Order            Diet regular Room service appropriate? Yes; Fluid consistency: Thin  Diet effective now             EDUCATION NEEDS:   No education needs have been identified at this time  Skin:  Skin Assessment: Reviewed RN Assessment  Last BM:  4/22- type 7  Height:   Ht Readings from Last 1 Encounters:  06/20/18 6' (1.829 m)    Weight:   Wt Readings from Last 1  Encounters:  06/21/18 73.5 kg    Ideal Body Weight:  80.9 kg  BMI:  Body mass index is 21.97 kg/m.  Estimated Nutritional Needs:   Kcal:  2200-2500kcal/day   Protein:  110-125g/day   Fluid:  2.2L/day   Betsey Holiday MS, RD, LDN Pager #- [phone] Office#- [phone] After Hours Pager: [phone]

## 2018-06-21 NOTE — Consult Note (Signed)
[facility] Face-to-Face Psychiatry Consult   Reason for Consult:  Requesting alcohol detox Referring Physician:  Dr. Allena Katz Patient Identification: Levi Mejia MRN:  [id number] Principal Diagnosis: Alcohol withdrawal (HCC) Diagnosis:  Principal Problem:   Alcohol withdrawal (HCC) Active Problems:   Alcohol dependence (HCC)  Patient seen and chart reviewed, confirmed CIWA scoring with nursing staff. Total Time spent with patient: 1 hour  Subjective: "I have been drinking since I was 23, and I really increased how much I was drinking over the past week and a half since I have been off work."  HPI: Levi Mejia is a 48 y.o. male patient with a known history of chronic alcoholism, hyperlipidemia comes to the emergency room feeling anxious with elevated heart rate and blood pressure. Patient has been drinking heavily and has history of chronic alcoholism. He was prescribed Librium by Dr. Alphonsus Sias as outpatient. Patient continued to take one tablet of Librium every four hours at home however his symptoms worsened and came to the emergency room asking for help to get detox from alcohol. His blood pressure was elevated and tachycardic. Patient was also noted to have electrolyte imbalance. He is not been eating or drinking well.     On evaluation, patient is sleeping soundly and requires mild shaking in order to arouse.  Once aroused, patient is alert and oriented.  Patient describes that he has been drinking alcohol heavily since the age of 16 years.  He reports his longest period of sobriety has only been 1 to 2 weeks, and that has been many years ago.  Patient states that he currently has had a change in his work schedule, where he works 2 weeks on and 2 weeks off.  He states that in the past week and a half his alcohol intake has gone up significantly due to the stress of stay at home orders, loss of work/finances and fears/concerns of COVID-19.  Patient describes that he has been drinking 1/2 gallon  of vodka and 2 bottles of wine daily  He states, "my family had an intervention with me and told me I needed to get help."  Patient denies any suicidal ideation, plan, or intent.  He denies any homicidal ideation.  He denies any auditory or visual hallucinations.  Patient denies any problems with anxiety or insomnia.  Patient denies any history of mania, hypomania or psychosis symptoms. Patient denies history of alcohol withdrawal, seizures or delirium tremens.  Psychiatry consult is requested for assistance in alcohol detox.  Past Psychiatric History: Chronic alcoholism  Risk to Self:  none Risk to Others:  none Prior Inpatient Therapy:  none Prior Outpatient Therapy:  none.  Patient is on a wait list at [facility] further outpatient treatment program.  He is interested in other outpatient treatment program options.  Past Medical History:  Past Medical History:  Diagnosis Date  . Attention deficit disorder without mention of hyperactivity     Past Surgical History:  Procedure Laterality Date  . Hand tendon reattachment  1992   Right hand  . toe reattachment  1996   Right 1st toe   Family History:  Family History  Problem Relation Age of Onset  . Heart attack Father 80  . Hypertension Father   . Coronary artery disease Mother 12       Stent  . Cancer Mother        Bone  . Hypertension Mother   . Hypertension Brother   . Diabetes Neg Hx   . Prostate cancer Neg  Hx   . Colon cancer Neg Hx   . Heart disease Neg Hx    Family Psychiatric  History: none   Social History:  Social History   Substance and Sexual Activity  Alcohol Use Yes   Comment: Regular     Social History   Substance and Sexual Activity  Drug Use No    Social History   Socioeconomic History  . Marital status: Divorced    Spouse name: Not on file  . Number of children: 1  . Years of education: Not on file  . Highest education level: Not on file  Occupational History  . Occupation: CuratorMechanic     Comment: [employer]  Social Needs  . Financial resource strain: Not on file  . Food insecurity:    Worry: Not on file    Inability: Not on file  . Transportation needs:    Medical: Not on file    Non-medical: Not on file  Tobacco Use  . Smoking status: Never Smoker  . Smokeless tobacco: Never Used  Substance and Sexual Activity  . Alcohol use: Yes    Comment: Regular  . Drug use: No  . Sexual activity: Not on file  Lifestyle  . Physical activity:    Days per week: Not on file    Minutes per session: Not on file  . Stress: Not on file  Relationships  . Social connections:    Talks on phone: Not on file    Gets together: Not on file    Attends religious service: Not on file    Active member of club or organization: Not on file    Attends meetings of clubs or organizations: Not on file    Relationship status: Not on file  Other Topics Concern  . Not on file  Social History Narrative   Divorced   Common law marriage since 2006      1 daughter   Additional Social History:   Lives with girlfriend and their 48 year old daughter. 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  His hours have been reduced to 2 weeks on 2 weeks off.  Denies other drug use.  Allergies:  No Known Allergies  Labs:  Results for orders placed or performed during the hospital encounter of 06/20/18 (from the past 48 hour(s))  Urine Drug Screen, Qualitative ([facility] only)     Status: None   Collection Time: 06/20/18 10:18 PM  Result Value Ref Range   Tricyclic, Ur Screen NONE DETECTED NONE DETECTED   Amphetamines, Ur Screen NONE DETECTED NONE DETECTED   MDMA (Ecstasy)Ur Screen NONE DETECTED NONE DETECTED   Cocaine Metabolite,Ur [place] NONE DETECTED NONE DETECTED   Opiate, Ur Screen NONE DETECTED NONE DETECTED   Phencyclidine (PCP) Ur S NONE DETECTED NONE DETECTED   Cannabinoid 50 Ng, Ur [place] NONE DETECTED NONE DETECTED   Barbiturates, Ur Screen NONE DETECTED NONE DETECTED   Benzodiazepine, Ur  Scrn NONE DETECTED NONE DETECTED   Methadone Scn, Ur NONE DETECTED NONE DETECTED    Comment: (NOTE) Tricyclics + metabolites, urine    Cutoff 1000 ng/mL Amphetamines + metabolites, urine  Cutoff 1000 ng/mL MDMA (Ecstasy), urine              Cutoff 500 ng/mL Cocaine Metabolite, urine          Cutoff 300 ng/mL Opiate + metabolites, urine        Cutoff 300 ng/mL Phencyclidine (PCP), urine  Cutoff 25 ng/mL Cannabinoid, urine                 Cutoff 50 ng/mL Barbiturates + metabolites, urine  Cutoff 200 ng/mL Benzodiazepine, urine              Cutoff 200 ng/mL Methadone, urine                   Cutoff 300 ng/mL The urine drug screen provides only a preliminary, unconfirmed analytical test result and should not be used for non-medical purposes. Clinical consideration and professional judgment should be applied to any positive drug screen result due to possible interfering substances. A more specific alternate chemical method must be used in order to obtain a confirmed analytical result. Gas chromatography / mass spectrometry (GC/MS) is the preferred confirmat ory method. Performed at [facility], [address] [place], [place] [address]   CBC with Differential/Platelet     Status: Abnormal   Collection Time: 06/20/18 10:33 PM  Result Value Ref Range   WBC 9.9 4.0 - 10.5 K/uL   RBC 5.05 4.22 - 5.81 MIL/uL   Hemoglobin 16.9 13.0 - 17.0 g/dL   HCT 60.4 54.0 - 98.1 %   MCV 94.7 80.0 - 100.0 fL   MCH 33.5 26.0 - 34.0 pg   MCHC 35.4 30.0 - 36.0 g/dL   RDW 19.1 (L) 47.8 - 29.5 %   Platelets 150 150 - 400 K/uL   nRBC 0.0 0.0 - 0.2 %   Neutrophils Relative % 53 %   Neutro Abs 5.2 1.7 - 7.7 K/uL   Lymphocytes Relative 34 %   Lymphs Abs 3.4 0.7 - 4.0 K/uL   Monocytes Relative 7 %   Monocytes Absolute 0.7 0.1 - 1.0 K/uL   Eosinophils Relative 5 %   Eosinophils Absolute 0.5 0.0 - 0.5 K/uL   Basophils Relative 1 %   Basophils Absolute 0.1 0.0 - 0.1 K/uL   Immature  Granulocytes 0 %   Abs Immature Granulocytes 0.04 0.00 - 0.07 K/uL    Comment: Performed at [facility], [address] [place], [place] [address]  Comprehensive metabolic panel     Status: Abnormal   Collection Time: 06/20/18 10:33 PM  Result Value Ref Range   Sodium 133 (L) 135 - 145 mmol/L   Potassium 2.9 (L) 3.5 - 5.1 mmol/L   Chloride 89 (L) 98 - 111 mmol/L   CO2 26 22 - 32 mmol/L   Glucose, Bld 137 (H) 70 - 99 mg/dL   BUN 7 6 - 20 mg/dL   Creatinine, Ser 8.65 0.61 - 1.24 mg/dL   Calcium 9.2 8.9 - 78.4 mg/dL   Total Protein 9.3 (H) 6.5 - 8.1 g/dL   Albumin 5.1 (H) 3.5 - 5.0 g/dL   AST 696 (H) 15 - 41 U/L   ALT 93 (H) 0 - 44 U/L   Alkaline Phosphatase 92 38 - 126 U/L   Total Bilirubin 2.3 (H) 0.3 - 1.2 mg/dL   GFR calc non Af Amer >60 >60 mL/min   GFR calc Af Amer >60 >60 mL/min   Anion gap 18 (H) 5 - 15    Comment: Performed at [facility], [address], [place], [place] [address]  Magnesium     Status: Abnormal   Collection Time: 06/20/18 10:33 PM  Result Value Ref Range   Magnesium 1.5 (L) 1.7 - 2.4 mg/dL    Comment: Performed at [facility], [address]  [place], [place] [address]  Basic metabolic panel     Status: Abnormal   Collection Time: 06/21/18  3:20 AM  Result Value Ref Range   Sodium 138 135 - 145 mmol/L   Potassium 3.1 (L) 3.5 - 5.1 mmol/L   Chloride 101 98 - 111 mmol/L   CO2 26 22 - 32 mmol/L   Glucose, Bld 99 70 - 99 mg/dL   BUN 8 6 - 20 mg/dL   Creatinine, Ser 3.87 0.61 - 1.24 mg/dL   Calcium 8.4 (L) 8.9 - 10.3 mg/dL   GFR calc non Af Amer >60 >60 mL/min   GFR calc Af Amer >60 >60 mL/min   Anion gap 11 5 - 15    Comment: Performed at [facility], [address], [place], [place] [address]    Current Facility-Administered Medications  Medication Dose Route Frequency Provider Last Rate Last Dose  . 0.9 % NaCl with KCl 40 mEq / L  infusion   Intravenous Continuous Enedina Finner, MD 100 mL/hr at 06/21/18  0236 100 mL/hr at 06/21/18 0236  . atorvastatin (LIPITOR) tablet 20 mg  20 mg Oral Daily Enedina Finner, MD      . cloNIDine (CATAPRES) tablet 0.1 mg  0.1 mg Oral BID Enedina Finner, MD   0.1 mg at 06/21/18 0044  . enoxaparin (LOVENOX) injection 40 mg  40 mg Subcutaneous Q24H Enedina Finner, MD   40 mg at 06/21/18 0231  . folic acid (FOLVITE) tablet 1 mg  1 mg Oral Daily Enedina Finner, MD      . hydrALAZINE (APRESOLINE) injection 10 mg  10 mg Intravenous Q6H PRN Enedina Finner, MD      . LORazepam (ATIVAN) injection 0-4 mg  0-4 mg Intravenous Q6H Enedina Finner, MD   2 mg at 06/20/18 2245   Or  . LORazepam (ATIVAN) tablet 0-4 mg  0-4 mg Oral Q6H Enedina Finner, MD      . Melene Muller ON 06/23/2018] LORazepam (ATIVAN) injection 0-4 mg  0-4 mg Intravenous Q12H Enedina Finner, MD       Or  . Melene Muller ON 06/23/2018] LORazepam (ATIVAN) tablet 0-4 mg  0-4 mg Oral Q12H Enedina Finner, MD      . multivitamin with minerals tablet 1 tablet  1 tablet Oral Daily Enedina Finner, MD      . ondansetron [facility]) tablet 4 mg  4 mg Oral Q6H PRN Enedina Finner, MD       Or  . ondansetron (ZOFRAN) injection 4 mg  4 mg Intravenous Q6H PRN Enedina Finner, MD      . polyethylene glycol (MIRALAX / GLYCOLAX) packet 17 g  17 g Oral Daily PRN Enedina Finner, MD      . thiamine (VITAMIN B-1) tablet 100 mg  100 mg Oral Daily Enedina Finner, MD       Or  . thiamine (B-1) injection 100 mg  100 mg Intravenous Daily Enedina Finner, MD        Musculoskeletal: Strength & Muscle Tone: within normal limits Gait & Station: normal Patient leans: N/A  Psychiatric Specialty Exam: Physical Exam  Nursing note and vitals reviewed. Constitutional: He is oriented to person, place, and time. He appears well-developed and well-nourished. No distress.  HENT:  Head: Normocephalic and atraumatic.  Eyes: EOM are normal.  Neck: Normal range of motion.  Cardiovascular: Normal rate and regular rhythm.  Respiratory: Effort normal. No respiratory distress.  Musculoskeletal: Normal range  of motion.  Neurological: He is alert and oriented to person,  place, and time.    Review of Systems  Constitutional: Negative.   HENT: Negative.   Respiratory: Negative.   Cardiovascular: Negative.   Gastrointestinal: Negative.   Musculoskeletal: Negative.   Neurological: Negative.   Psychiatric/Behavioral: Positive for substance abuse (alcohol). Negative for depression, hallucinations, memory loss and suicidal ideas. The patient is not nervous/anxious and does not have insomnia.     Blood pressure 124/86, pulse 89, temperature 98.9 F (37.2 C), temperature source Oral, resp. rate 18, height 6' (1.829 m), weight 73.5 kg, SpO2 96 %.Body mass index is 21.97 kg/m.  General Appearance: Casual  Eye Contact:  Good  Speech:  Clear and Coherent and Normal Rate  Volume:  Normal  Mood:  Euthymic  Affect:  Appropriate and Congruent  Thought Process:  Coherent, Goal Directed and Descriptions of Associations: Intact  Orientation:  Full (Time, Place, and Person)  Thought Content:  Logical and Hallucinations: None  Suicidal Thoughts:  No  Homicidal Thoughts:  No  Memory:  Good  Judgement:  Good  Insight:  Good  Psychomotor Activity:  Normal  Concentration:  Concentration: Good  Recall:  Good  Fund of Knowledge:  Good  Language:  Good  Akathisia:  No  Handed:  Right  AIMS (if indicated):     Assets:  Communication Skills Desire for Improvement Financial Resources/Insurance Housing Intimacy Physical Health Social Support Transportation Vocational/Educational  ADL's:  Intact  Cognition:  WNL  Sleep:   adequate since being in the hospital.     Treatment Plan Summary: Medication management Ativan taper for alcohol detox has been started, as attempted taper at home with Librium was not effective.  Patient denies any untoward effects from current taper.  He reports that he would like to discharge from the hospital by Saturday.  He has been notified that this would require speeding  up his taper, to which she is agreeable.  I have adjusted Ativan taper in order for patient to have last dose on Saturday morning after which she can discharge, from psychiatric point of view.  Disposition: No evidence of imminent risk to self or others at present.   Patient does not meet criteria for psychiatric inpatient admission. Supportive therapy provided about ongoing stressors. Refer to IOP. Discussed crisis plan, support from social network, calling 911, coming to the Emergency Department, and calling Suicide Hotline. Patient has been provided with resources/contact information for [facility] to enroll in substance use rehabilitation treatment program.  Please contact psychiatry for further concerns or if further assistance needed.  Mariel Craft, MD 06/21/2018 10:23 AM

## 2018-06-21 NOTE — ED Notes (Signed)
.. ED TO INPATIENT HANDOFF REPORT  ED Nurse Name and Phone #: Levi Mejia [phone]  S Name/Age/Gender [name] 48 y.o. male Room/Bed: ED26A/ED26A  Code Status   Code Status: Full Code  Home/SNF/Other Home Patient oriented to: self, place, time and situation Is this baseline? Yes   Triage Complete: Triage complete  Chief Complaint panic attacks  Triage Note Pt is detoxing from alcohol since Monday. PMD called in librium yesterday because he started feeling anxious. Here today due to elevated HR states 160 at home and increased BP. Pt here for help with symptoms does not request psychiatry.    Allergies No Known Allergies  Level of Care/Admitting Diagnosis ED Disposition    ED Disposition Condition Comment   Admit  The patient appears reasonably stabilized for admission considering the current resources, flow, and capabilities available in the ED at this time, and I doubt any other [facility] requiring further screening and/or treatment in the ED prior to admission is  present.       B Medical/Surgery History Past Medical History:  Diagnosis Date  . Attention deficit disorder without mention of hyperactivity    Past Surgical History:  Procedure Laterality Date  . Hand tendon reattachment  1992   Right hand  . toe reattachment  1996   Right 1st toe     A IV Location/Drains/Wounds Patient Lines/Drains/Airways Status   Active Line/Drains/Airways    Name:   Placement date:   Placement time:   Site:   Days:   Peripheral IV 06/20/18 Left Antecubital   06/20/18    2228    Antecubital   1          Intake/Output Last 24 hours No intake or output data in the 24 hours ending 06/21/18 0012  Labs/Imaging Results for orders placed or performed during the hospital encounter of 06/20/18 (from the past 48 hour(s))  Urine Drug Screen, Qualitative ([facility] only)     Status: None   Collection Time: 06/20/18 10:18 PM  Result Value Ref Range   Tricyclic, Ur Screen NONE DETECTED  NONE DETECTED   Amphetamines, Ur Screen NONE DETECTED NONE DETECTED   MDMA (Ecstasy)Ur Screen NONE DETECTED NONE DETECTED   Cocaine Metabolite,Ur [place] NONE DETECTED NONE DETECTED   Opiate, Ur Screen NONE DETECTED NONE DETECTED   Phencyclidine (PCP) Ur S NONE DETECTED NONE DETECTED   Cannabinoid 50 Ng, Ur [place] NONE DETECTED NONE DETECTED   Barbiturates, Ur Screen NONE DETECTED NONE DETECTED   Benzodiazepine, Ur Scrn NONE DETECTED NONE DETECTED   Methadone Scn, Ur NONE DETECTED NONE DETECTED    Comment: (NOTE) Tricyclics + metabolites, urine    Cutoff 1000 ng/mL Amphetamines + metabolites, urine  Cutoff 1000 ng/mL MDMA (Ecstasy), urine              Cutoff 500 ng/mL Cocaine Metabolite, urine          Cutoff 300 ng/mL Opiate + metabolites, urine        Cutoff 300 ng/mL Phencyclidine (PCP), urine         Cutoff 25 ng/mL Cannabinoid, urine                 Cutoff 50 ng/mL Barbiturates + metabolites, urine  Cutoff 200 ng/mL Benzodiazepine, urine              Cutoff 200 ng/mL Methadone, urine                   Cutoff 300 ng/mL The urine drug screen  provides only a preliminary, unconfirmed analytical test result and should not be used for non-medical purposes. Clinical consideration and professional judgment should be applied to any positive drug screen result due to possible interfering substances. A more specific alternate chemical method must be used in order to obtain a confirmed analytical result. Gas chromatography / mass spectrometry (GC/MS) is the preferred confirmat ory method. Performed at [facility], [address] [place], [place] [address]   CBC with Differential/Platelet     Status: Abnormal   Collection Time: 06/20/18 10:33 PM  Result Value Ref Range   WBC 9.9 4.0 - 10.5 K/uL   RBC 5.05 4.22 - 5.81 MIL/uL   Hemoglobin 16.9 13.0 - 17.0 g/dL   HCT 60.4 54.0 - 98.1 %   MCV 94.7 80.0 - 100.0 fL   MCH 33.5 26.0 - 34.0 pg   MCHC 35.4 30.0 - 36.0 g/dL   RDW 19.1 (L)  47.8 - 15.5 %   Platelets 150 150 - 400 K/uL   nRBC 0.0 0.0 - 0.2 %   Neutrophils Relative % 53 %   Neutro Abs 5.2 1.7 - 7.7 K/uL   Lymphocytes Relative 34 %   Lymphs Abs 3.4 0.7 - 4.0 K/uL   Monocytes Relative 7 %   Monocytes Absolute 0.7 0.1 - 1.0 K/uL   Eosinophils Relative 5 %   Eosinophils Absolute 0.5 0.0 - 0.5 K/uL   Basophils Relative 1 %   Basophils Absolute 0.1 0.0 - 0.1 K/uL   Immature Granulocytes 0 %   Abs Immature Granulocytes 0.04 0.00 - 0.07 K/uL    Comment: Performed at [facility], [address] [place], [place] [address]  Comprehensive metabolic panel     Status: Abnormal   Collection Time: 06/20/18 10:33 PM  Result Value Ref Range   Sodium 133 (L) 135 - 145 mmol/L   Potassium 2.9 (L) 3.5 - 5.1 mmol/L   Chloride 89 (L) 98 - 111 mmol/L   CO2 26 22 - 32 mmol/L   Glucose, Bld 137 (H) 70 - 99 mg/dL   BUN 7 6 - 20 mg/dL   Creatinine, Ser 1.30 0.61 - 1.24 mg/dL   Calcium 9.2 8.9 - 86.5 mg/dL   Total Protein 9.3 (H) 6.5 - 8.1 g/dL   Albumin 5.1 (H) 3.5 - 5.0 g/dL   AST 784 (H) 15 - 41 U/L   ALT 93 (H) 0 - 44 U/L   Alkaline Phosphatase 92 38 - 126 U/L   Total Bilirubin 2.3 (H) 0.3 - 1.2 mg/dL   GFR calc non Af Amer >60 >60 mL/min   GFR calc Af Amer >60 >60 mL/min   Anion gap 18 (H) 5 - 15    Comment: Performed at [facility], [address], [place], [place] [address]   No results found.  Pending Labs Unresulted Labs (From admission, onward)   None      Vitals/Pain Today's Vitals   06/20/18 2222 06/20/18 2230 06/20/18 2336 06/21/18 0012  BP: (!) 180/123 (!) 176/109 (!) 159/110   Pulse: (!) 133 (!) 111 (!) 129   Resp:  18    Temp:      TempSrc:      SpO2:  96%    Weight:      Height:      PainSc:    0-No pain    Isolation Precautions No active isolations  Medications Medications  LORazepam (ATIVAN) injection 0-4 mg (2 mg Intravenous Given 06/20/18 2245)  Or  LORazepam (ATIVAN) tablet 0-4 mg ( Oral See Alternative  06/20/18 2245)  LORazepam (ATIVAN) injection 0-4 mg (has no administration in time range)    Or  LORazepam (ATIVAN) tablet 0-4 mg (has no administration in time range)  thiamine (VITAMIN B-1) tablet 100 mg (has no administration in time range)    Or  thiamine (B-1) injection 100 mg (has no administration in time range)  sodium chloride 0.9 % 1,000 mL with thiamine 100 mg, folic acid 1 mg, multivitamins adult 10 mL infusion (has no administration in time range)  sodium chloride 0.9 % bolus 1,000 mL (1,000 mLs Intravenous New Bag/Given 06/21/18 0011)    Mobility walks Low fall risk   Focused Assessments Neuro Assessment Handoff:  Swallow screen pass? Yes  Cardiac Rhythm: Sinus tachycardia       Neuro Assessment: Within Defined Limits Neuro Checks:      Last Documented NIHSS Modified Score:   Has TPA been given? No If patient is a Neuro Trauma and patient is going to OR before floor call report to 4N Charge nurse: [phone] or [phone]     R Recommendations: See Admitting Provider Note  Report given to:   Additional Notes: Pt currently alert and oriented x4.

## 2018-06-21 NOTE — Progress Notes (Signed)
Notify Dr. Tobi Bastos if patient can have PRN pain medication, Tylenol given. RN will continue to monitor.

## 2018-06-21 NOTE — Progress Notes (Signed)
Advanced care plan. Purpose of the Encounter: CODE STATUS Parties in Attendance:Patient Patient's Decision Capacity:Good Subjective/Patient's story: 48 year old male patient with history of chronic alcohol abuse, hyperlipidemia currently under hospitalist service for alcohol withdrawal Objective/Medical story Patient needs control of blood pressure. Needs ciwa protocol for alcohol with drawal. Needs electrolyte supplementation. Goals of care determination:  Advance care directives goals of care and treatment plan discussed Patient for now wants everything done which includes CPR, intubation ventilator the need arises CODE STATUS: Full Code Time spent discussing advanced care planning: 16 minutes

## 2018-06-21 NOTE — H&P (Addendum)
[facility] - [place] at [facility]   PATIENT NAME: Levi Mejia    MR#:  [id number]  DATE OF BIRTH:  26-Apr-1970  DATE OF ADMISSION:  06/20/2018  PRIMARY CARE PHYSICIAN: Karie Schwalbe, MD   REQUESTING/REFERRING PHYSICIAN: Dr Manson Passey  CHIEF COMPLAINT:  I want to get detox with alcohol and want to stop completely  HISTORY OF PRESENT ILLNESS:  Levi Mejia  is a 48 y.o. male with a known history of chronic alcoholism, hyperlipidemia comes to the emergency room feeling anxious with elevated heart rate and blood pressure. Patient has been drinking heavily and has history of chronic alcoholism. He was prescribed Librium by Dr. Alphonsus Sias as outpatient. Patient continued to take one tablet of Librium every four hours at home however his symptoms worsened and came to the emergency room asking for help to get detox from alcohol. His blood pressure was elevated and tachycardic. Patient was also noted to have electrolyte imbalance. He is not been eating or drinking well.  Being admitted for alcohol withdrawal.  PAST MEDICAL HISTORY:   Past Medical History:  Diagnosis Date  . Attention deficit disorder without mention of hyperactivity     PAST SURGICAL HISTOIRY:   Past Surgical History:  Procedure Laterality Date  . Hand tendon reattachment  1992   Right hand  . toe reattachment  1996   Right 1st toe    SOCIAL HISTORY:   Social History   Tobacco Use  . Smoking status: Never Smoker  . Smokeless tobacco: Never Used  Substance Use Topics  . Alcohol use: Yes    Comment: Regular    FAMILY HISTORY:   Family History  Problem Relation Age of Onset  . Heart attack Father 20  . Hypertension Father   . Coronary artery disease Mother 58       Stent  . Cancer Mother        Bone  . Hypertension Mother   . Hypertension Brother   . Diabetes Neg Hx   . Prostate cancer Neg Hx   . Colon cancer Neg Hx   . Heart disease Neg Hx     DRUG ALLERGIES:  No  Known Allergies  REVIEW OF SYSTEMS:  Review of Systems  Constitutional: Positive for diaphoresis. Negative for chills, fever and weight loss.  HENT: Negative for ear discharge, ear pain and nosebleeds.   Eyes: Negative for blurred vision, pain and discharge.  Respiratory: Negative for sputum production, shortness of breath, wheezing and stridor.   Cardiovascular: Positive for palpitations. Negative for chest pain, orthopnea and PND.  Gastrointestinal: Negative for abdominal pain, diarrhea, nausea and vomiting.  Genitourinary: Negative for frequency and urgency.  Musculoskeletal: Negative for back pain and joint pain.  Neurological: Positive for tingling and weakness. Negative for sensory change, speech change and focal weakness.  Psychiatric/Behavioral: Negative for depression and hallucinations. The patient is nervous/anxious.      MEDICATIONS AT HOME:   Prior to Admission medications   Medication Sig Start Date End Date Taking? Authorizing Provider  Ascorbic Acid (VITAMIN C) 1000 MG tablet Take 1,000 mg by mouth daily.   Yes [provider]  atorvastatin (LIPITOR) 20 MG tablet Take 1 tablet (20 mg total) by mouth daily. 03/02/18  Yes Karie Schwalbe, MD  chlordiazePOXIDE (LIBRIUM) 25 MG capsule Take 1 capsule (25 mg total) by mouth every 4 (four) hours as needed for anxiety. 06/20/18  Yes Karie Schwalbe, MD  Melatonin 10 MG CAPS Take 10 mg by mouth  at bedtime.    Yes [provider]  LORazepam (ATIVAN) 1 MG tablet Take 1 tablet (1 mg total) by mouth every 8 (eight) hours as needed for anxiety. Patient not taking: Reported on 05/21/2018 02/23/18   Irean HongSung, Jade J, MD      VITAL SIGNS:  Blood pressure (!) 147/106, pulse (!) 114, temperature 98.9 F (37.2 C), temperature source Oral, resp. rate 16, height 6' (1.829 m), weight 72.6 kg, SpO2 93 %.  PHYSICAL EXAMINATION:  GENERAL:  48 y.o.-year-old patient lying in the bed with no acute distress.  EYES: Pupils equal,  round, reactive to light and accommodation. No scleral icterus. Extraocular muscles intact.  HEENT: Head atraumatic, normocephalic. Oropharynx and nasopharynx clear.  NECK:  Supple, no jugular venous distention. No thyroid enlargement, no tenderness.  LUNGS: Normal breath sounds bilaterally, no wheezing, rales,rhonchi or crepitation. No use of accessory muscles of respiration.  CARDIOVASCULAR: S1, S2 normal. No murmurs, rubs, or gallops. Tachycardia ABDOMEN: Soft, nontender, nondistended. Bowel sounds present. No organomegaly or mass.  EXTREMITIES: No pedal edema, cyanosis, or clubbing.  NEUROLOGIC: Cranial nerves II through XII are intact. Muscle strength 5/5 in all extremities. Sensation intact. Gait not checked.  PSYCHIATRIC: The patient is alert and oriented x 3. Anxious SKIN: No obvious rash, lesion, or ulcer.   LABORATORY PANEL:   CBC Recent Labs  Lab 06/20/18 2233  WBC 9.9  HGB 16.9  HCT 47.8  PLT 150   ------------------------------------------------------------------------------------------------------------------  Chemistries  Recent Labs  Lab 06/20/18 2233  NA 133*  K 2.9*  CL 89*  CO2 26  GLUCOSE 137*  BUN 7  CREATININE 1.04  CALCIUM 9.2  AST 203*  ALT 93*  ALKPHOS 92  BILITOT 2.3*   ------------------------------------------------------------------------------------------------------------------  Cardiac Enzymes No results for input(s): TROPONINI in the last 168 hours. ------------------------------------------------------------------------------------------------------------------  RADIOLOGY:  No results found.  EKG:   Tachycardia IMPRESSION AND PLAN:   [name]  is a 48 y.o. male with a known history of chronic alcoholism, hyperlipidemia comes to the emergency room feeling anxious with elevated heart rate and blood pressure. Patient has been drinking heavily and has history of chronic alcoholism.  1. Acute alcohol withdrawal -patient  has history of heavy alcohol consumption. Last drink was Monday morning. Drank very heavy on the weekend. Patient normally drinks two bottles of wine every day along with fifth of liquor. -Admit to telemetry -CIWA protocol initiated with IV Ativan -psychiatry consultation. Added Dr. Rebecca EatonMauer to the list. I have not informed her personally -watch for DTs and seizures  2. Electrolyte abnormality with hyponatremia and hypokalemia and hypochloremia due to poor PO in the setting of alcoholism woth alcoholic ketoacidosis -IV fluids with IV KCl -monitor input output -check magnesium  3. Elevated transaminases secondary to alcoholic hepatitis -patient appears to have elevated bilirubin as well -continue to monitor labs  4. Malignant hypertension -PRN hydralazine -I will start oral clonidine  5. DVT prophylaxis subcu Lovenox  All the records are reviewed and case discussed with ED provider.   CODE STATUS:full  TOTAL critical TIME TAKING CARE OF THIS PATIENT: 50 minutes.    Enedina FinnerSona Charlet Harr M.D on 06/21/2018 at 12:14 AM  Between 7am to 6pm - Pager - [phone]  After 6pm go to [email or web address] - password [email or web address]  [facility]  Office  [phone]  CC: Primary care physician; Karie SchwalbeLetvak, Richard I, MD

## 2018-06-21 NOTE — ED Notes (Signed)
Levi Mejia attempted to give report.

## 2018-06-22 ENCOUNTER — Telehealth: Payer: Self-pay | Admitting: Internal Medicine

## 2018-06-22 ENCOUNTER — Telehealth: Payer: Self-pay

## 2018-06-22 ENCOUNTER — Ambulatory Visit: Payer: 59 | Admitting: Internal Medicine

## 2018-06-22 LAB — POTASSIUM: Potassium: 3.7 mmol/L (ref 3.5–5.1)

## 2018-06-22 MED ORDER — ADULT MULTIVITAMIN W/MINERALS CH: 1.0000 | ORAL_TABLET | Freq: Every day | ORAL | 0 refills | Status: AC

## 2018-06-22 MED ORDER — LORAZEPAM 1 MG PO TABS: 1.0000 mg | ORAL_TABLET | Freq: Every day | ORAL | 0 refills | Status: AC

## 2018-06-22 MED ORDER — CLONIDINE HCL 0.1 MG PO TABS: 0.1000 mg | ORAL_TABLET | Freq: Two times a day (BID) | ORAL | 11 refills | Status: DC

## 2018-06-22 MED ORDER — THIAMINE HCL 100 MG PO TABS: 100.0000 mg | ORAL_TABLET | Freq: Every day | ORAL | 0 refills | Status: AC

## 2018-06-22 MED ORDER — FOLIC ACID 1 MG PO TABS: 1.0000 mg | ORAL_TABLET | Freq: Every day | ORAL | 0 refills | Status: AC

## 2018-06-22 MED ORDER — ENSURE ENLIVE PO LIQD: 237.0000 mL | Freq: Two times a day (BID) | ORAL | 12 refills | Status: DC

## 2018-06-22 NOTE — Telephone Encounter (Signed)
Inbound call from [employer] inquiring about Librium. Advised patient's emergency contact that Dr. Tobi Bastos discontinued medication today. She verbalized understanding.

## 2018-06-22 NOTE — Progress Notes (Signed)
Patient discharge to home. Tele and IV d/c'd prior to discharge. Patient verbalized understanding of discharge instructions.

## 2018-06-22 NOTE — Discharge Summary (Signed)
[facility] - [place] at [facility]   PATIENT NAME: Levi Mejia    MR#:  [id number]  DATE OF BIRTH:  March 06, 1970  DATE OF ADMISSION:  06/20/2018 ADMITTING PHYSICIAN: Enedina Finner, MD  DATE OF DISCHARGE: 06/22/2018  PRIMARY CARE PHYSICIAN: Karie Schwalbe, MD   ADMISSION DIAGNOSIS:  Alcohol withdrawal syndrome with complication (HCC) [F10.239]  DISCHARGE DIAGNOSIS:  Principal Problem:   Alcohol withdrawal (HCC) Active Problems:   Alcohol dependence (HCC)   SECONDARY DIAGNOSIS:   Past Medical History:  Diagnosis Date  . Attention deficit disorder without mention of hyperactivity      ADMITTING HISTORY Levi Mejia  is a 48 y.o. male with a known history of chronic alcoholism, hyperlipidemia comes to the emergency room feeling anxious with elevated heart rate and blood pressure. Patient has been drinking heavily and has history of chronic alcoholism. He was prescribed Librium by Dr. Alphonsus Sias as outpatient. Patient continued to take one tablet of Librium every four hours at home however his symptoms worsened and came to the emergency room asking for help to get detox from alcohol. His blood pressure was elevated and tachycardic. Patient was also noted to have electrolyte imbalance. He is not been eating or drinking well. Being admitted for alcohol withdrawal.  HOSPITAL COURSE:  Patient was admitted to medical floor.  Patient was put on CIWA protocol for alcohol withdrawal.  Thiamine and folic acid were supplemented.  Psychiatric consultation was done during hospitalization.  Patient was advised to continue detox but did not qualify for inpatient psychiatric admission.  Electrolytes were corrected.  Patient's tremors improved.  Tolerated diet well.  Blood pressure better controlled with clonidine.  Patient will be discharged home and will continue taper for another 2 days.  Disposition plan discussed with patient.  Advised patient to attend alcohol Anonymous groups  and counseling sessions as outpatient.  CONSULTS OBTAINED:  Treatment Team:  Mariel Craft, MD  DRUG ALLERGIES:  No Known Allergies  DISCHARGE MEDICATIONS:   Allergies as of 06/22/2018   No Known Allergies     Medication List    STOP taking these medications   chlordiazePOXIDE 25 MG capsule Commonly known as:  LIBRIUM     TAKE these medications   atorvastatin 20 MG tablet Commonly known as:  LIPITOR Take 1 tablet (20 mg total) by mouth daily.   cloNIDine 0.1 MG tablet Commonly known as:  CATAPRES Take 1 tablet (0.1 mg total) by mouth 2 (two) times daily.   feeding supplement (ENSURE ENLIVE) Liqd Take 237 mLs by mouth 2 (two) times daily between meals.   folic acid 1 MG tablet Commonly known as:  FOLVITE Take 1 tablet (1 mg total) by mouth daily for 30 days. Start taking on:  June 23, 2018   LORazepam 1 MG tablet Commonly known as:  Ativan Take 1 tablet (1 mg total) by mouth daily for 2 days. What changed:    when to take this  reasons to take this   Melatonin 10 MG Caps Take 10 mg by mouth at bedtime.   multivitamin with minerals Tabs tablet Take 1 tablet by mouth daily for 30 days. Start taking on:  June 23, 2018   thiamine 100 MG tablet Take 1 tablet (100 mg total) by mouth daily for 30 days. Start taking on:  June 23, 2018   vitamin C 1000 MG tablet Take 1,000 mg by mouth daily.       Today  Patient seen today No chest pain No  shortness of breath Tolerating diet well Hemodynamically stable  VITAL SIGNS:  Blood pressure (!) 123/98, pulse 78, temperature 98.6 F (37 C), temperature source Oral, resp. rate 15, height 6' (1.829 m), weight 73.5 kg, SpO2 98 %.  I/O:    Intake/Output Summary (Last 24 hours) at 06/22/2018 1153 Last data filed at 06/21/2018 2313 Gross per 24 hour  Intake 1982.67 ml  Output 700 ml  Net 1282.67 ml    PHYSICAL EXAMINATION:  Physical Exam  GENERAL:  48 y.o.-year-old patient lying in the bed with no  acute distress.  LUNGS: Normal breath sounds bilaterally, no wheezing, rales,rhonchi or crepitation. No use of accessory muscles of respiration.  CARDIOVASCULAR: S1, S2 normal. No murmurs, rubs, or gallops.  ABDOMEN: Soft, non-tender, non-distended. Bowel sounds present. No organomegaly or mass.  NEUROLOGIC: Moves all 4 extremities. PSYCHIATRIC: The patient is alert and oriented x 3.  SKIN: No obvious rash, lesion, or ulcer.   DATA REVIEW:   CBC Recent Labs  Lab 06/20/18 2233  WBC 9.9  HGB 16.9  HCT 47.8  PLT 150    Chemistries  Recent Labs  Lab 06/20/18 2233 06/21/18 0320 06/22/18 0302  NA 133* 138  --   K 2.9* 3.1* 3.7  CL 89* 101  --   CO2 26 26  --   GLUCOSE 137* 99  --   BUN 7 8  --   CREATININE 1.04 0.99  --   CALCIUM 9.2 8.4*  --   MG 1.5*  --   --   AST 203*  --   --   ALT 93*  --   --   ALKPHOS 92  --   --   BILITOT 2.3*  --   --     Cardiac Enzymes No results for input(s): TROPONINI in the last 168 hours.  Microbiology Results  No results found for this or any previous visit.  RADIOLOGY:  No results found.  Follow up with PCP in 1 week.  Management plans discussed with the patient, family and they are in agreement.  CODE STATUS: Full code    Code Status Orders  (From admission, onward)         Start     Ordered   06/21/18 0138  Full code  Continuous     06/21/18 0137        Code Status History    Date Active Date Inactive Code Status Order ID Comments User Context   06/20/2018 2223 06/21/2018 0137 Full Code [id number]  Willy Eddyobinson, Patrick, MD ED    Advance Directive Documentation     Most Recent Value  Type of Advance Directive  Living will  Pre-existing out of facility DNR order (yellow form or pink MOST form)  -  "MOST" Form in Place?  -      TOTAL TIME TAKING CARE OF THIS PATIENT ON DAY OF DISCHARGE: more than 35 minutes.   [name]  M.D on 06/22/2018 at 11:53 AM  Between 7am to 6pm - Pager - [phone]  After 6pm go  to [email or web address] - password [email or web address]  [facility]  Office  [phone]  CC: Primary care physician; Karie SchwalbeLetvak, Richard I, MD  Note: This dictation was prepared with Dragon dictation along with smaller phrase technology. Any transcriptional errors that result from this process are unintentional.

## 2018-06-22 NOTE — Telephone Encounter (Signed)
Patient is scheduled for a virtual Hospital follow up on 06/29/2018 @ 11:00am  Does this need to be an in person visit?

## 2018-06-22 NOTE — Telephone Encounter (Signed)
No ---virtual is fine

## 2018-06-23 NOTE — Telephone Encounter (Signed)
Yes---he got his medication taper for the withdrawal symptoms from the hospital doctor

## 2018-06-26 ENCOUNTER — Telehealth: Payer: Self-pay

## 2018-06-26 NOTE — Telephone Encounter (Signed)
Discharged on 06/22/2018 from [facility]  Dx: Alcohol withdrawal (HCC) Alcohol dependence (HCC)  Per Discharge Summary:  Follow up with PCP in 1 week.  Attempted to reach patient on primary phone. Left message with contact info.

## 2018-06-27 ENCOUNTER — Telehealth (HOSPITAL_COMMUNITY): Payer: Self-pay | Admitting: Psychology

## 2018-06-29 ENCOUNTER — Ambulatory Visit (INDEPENDENT_AMBULATORY_CARE_PROVIDER_SITE_OTHER): Payer: 59 | Admitting: Internal Medicine

## 2018-06-29 ENCOUNTER — Encounter: Payer: Self-pay | Admitting: Internal Medicine

## 2018-06-29 VITALS — Temp 98.6°F | Ht 70.0 in | Wt 170.0 lb

## 2018-06-29 DIAGNOSIS — F1029 Alcohol dependence with unspecified alcohol-induced disorder: Secondary | ICD-10-CM | POA: Diagnosis not present

## 2018-06-29 NOTE — Assessment & Plan Note (Signed)
Now abstinent since acute in hospital alcohol detox Feeling good and swears he has no interest in drinking Discussed the very high risk of relapse He will continue with the [facility] counselor Urged him strongly to contact AA and get a sponsor Hopefully he can get into the outpatient program in [place] as well

## 2018-06-29 NOTE — Progress Notes (Signed)
Subjective:    Patient ID: Levi Mejia, male    DOB: [birth year], 48 y.o.   MRN: [id number]  HPI Virtual visit for hospital follow up Identification done Reviewed billing and he gave consent He is at work and I am in my office  "I am doing better than I ever had" Withdrawal symptoms finally better after IV lorazepam  Has maintained abstinence so far Has counselor calling from [place]---will be maintaining contact His friend has also quit drinking  Current Outpatient Medications on File Prior to Visit  Medication Sig Dispense Refill  . Ascorbic Acid (VITAMIN C) 1000 MG tablet Take 1,000 mg by mouth daily.    Marland Kitchen atorvastatin (LIPITOR) 20 MG tablet Take 1 tablet (20 mg total) by mouth daily. 90 tablet 3  . cloNIDine (CATAPRES) 0.1 MG tablet Take 1 tablet (0.1 mg total) by mouth 2 (two) times daily. 60 tablet 11  . feeding supplement, ENSURE ENLIVE, (ENSURE ENLIVE) LIQD Take 237 mLs by mouth 2 (two) times daily between meals. 237 mL 12  . folic acid (FOLVITE) 1 MG tablet Take 1 tablet (1 mg total) by mouth daily for 30 days. 30 tablet 0  . Melatonin 10 MG CAPS Take 10 mg by mouth at bedtime.     . Multiple Vitamin (MULTIVITAMIN WITH MINERALS) TABS tablet Take 1 tablet by mouth daily for 30 days. 30 tablet 0  . thiamine 100 MG tablet Take 1 tablet (100 mg total) by mouth daily for 30 days. 30 tablet 0   No current facility-administered medications on file prior to visit.     No Known Allergies  Past Medical History:  Diagnosis Date  . Attention deficit disorder without mention of hyperactivity     Past Surgical History:  Procedure Laterality Date  . Hand tendon reattachment  1992   Right hand  . toe reattachment  1996   Right 1st toe    Family History  Problem Relation Age of Onset  . Heart attack Father 26  . Hypertension Father   . Coronary artery disease Mother 7       Stent  . Cancer Mother        Bone  . Hypertension Mother   . Hypertension Brother   .  Diabetes Neg Hx   . Prostate cancer Neg Hx   . Colon cancer Neg Hx   . Heart disease Neg Hx     Social History   Socioeconomic History  . Marital status: Divorced    Spouse name: Not on file  . Number of children: 1  . Years of education: Not on file  . Highest education level: Not on file  Occupational History  . Occupation: Curator    Comment: Engineering geologist  Social Needs  . Financial resource strain: Not on file  . Food insecurity:    Worry: Not on file    Inability: Not on file  . Transportation needs:    Medical: Not on file    Non-medical: Not on file  Tobacco Use  . Smoking status: Never Smoker  . Smokeless tobacco: Never Used  Substance and Sexual Activity  . Alcohol use: Yes    Comment: Regular  . Drug use: No  . Sexual activity: Not on file  Lifestyle  . Physical activity:    Days per week: Not on file    Minutes per session: Not on file  . Stress: Not on file  Relationships  . Social connections:    Talks  on phone: Not on file    Gets together: Not on file    Attends religious service: Not on file    Active member of club or organization: Not on file    Attends meetings of clubs or organizations: Not on file    Relationship status: Not on file  . Intimate partner violence:    Fear of current or ex partner: Not on file    Emotionally abused: Not on file    Physically abused: Not on file    Forced sexual activity: Not on file  Other Topics Concern  . Not on file  Social History Narrative   Divorced   Common law marriage since 2006      1 daughter   Review of Systems  Appetite has picked up Has put on 15# or so Sleeping fairly well--no night awakening like before     Objective:   Physical Exam  Constitutional: He appears well-developed. No distress.  Neurological:  Speech is normal Normal gait  Psychiatric: He has a normal mood and affect. His behavior is normal.           Assessment & Plan:

## 2018-10-15 ENCOUNTER — Encounter: Payer: 59 | Admitting: Internal Medicine

## 2018-11-07 ENCOUNTER — Other Ambulatory Visit: Payer: Self-pay

## 2018-11-07 ENCOUNTER — Encounter: Payer: Self-pay | Admitting: Internal Medicine

## 2018-11-07 ENCOUNTER — Ambulatory Visit: Payer: 59 | Admitting: Internal Medicine

## 2018-11-07 VITALS — BP 126/84 | HR 89 | Temp 98.8°F | Ht 69.5 in | Wt 171.0 lb

## 2018-11-07 DIAGNOSIS — Z0000 Encounter for general adult medical examination without abnormal findings: Secondary | ICD-10-CM | POA: Diagnosis not present

## 2018-11-07 DIAGNOSIS — F1020 Alcohol dependence, uncomplicated: Secondary | ICD-10-CM

## 2018-11-07 DIAGNOSIS — I679 Cerebrovascular disease, unspecified: Secondary | ICD-10-CM | POA: Diagnosis not present

## 2018-11-07 LAB — LIPID PANEL
Cholesterol: 245 mg/dL — ABNORMAL HIGH (ref 0–200)
HDL: 61.4 mg/dL (ref >39.00)
LDL Cholesterol: 145 mg/dL — ABNORMAL HIGH (ref 0–99)
NonHDL: 183.52
Total CHOL/HDL Ratio: 4
Triglycerides: 194 mg/dL — ABNORMAL HIGH (ref 0.0–149.0)
VLDL: 38.8 mg/dL (ref 0.0–40.0)

## 2018-11-07 LAB — COMPREHENSIVE METABOLIC PANEL
ALT: 22 U/L (ref 0–53)
AST: 26 U/L (ref 0–37)
Albumin: 4.5 g/dL (ref 3.5–5.2)
Alkaline Phosphatase: 67 U/L (ref 39–117)
BUN: 11 mg/dL (ref 6–23)
CO2: 32 mEq/L (ref 19–32)
Calcium: 9.4 mg/dL (ref 8.4–10.5)
Chloride: 96 mEq/L (ref 96–112)
Creatinine, Ser: 1.03 mg/dL (ref 0.40–1.50)
GFR: 77.02 mL/min (ref >60.00)
Glucose, Bld: 100 mg/dL — ABNORMAL HIGH (ref 70–99)
Potassium: 3.9 mEq/L (ref 3.5–5.1)
Sodium: 136 mEq/L (ref 135–145)
Total Bilirubin: 1 mg/dL (ref 0.2–1.2)
Total Protein: 7.9 g/dL (ref 6.0–8.3)

## 2018-11-07 LAB — CBC
HCT: 46.9 % (ref 39.0–52.0)
Hemoglobin: 16.6 g/dL (ref 13.0–17.0)
MCHC: 35.5 g/dL (ref 30.0–36.0)
MCV: 93.1 fl (ref 78.0–100.0)
Platelets: 208 10*3/uL (ref 150.0–400.0)
RBC: 5.03 Mil/uL (ref 4.22–5.81)
RDW: 12.7 % (ref 11.5–15.5)
WBC: 7.3 10*3/uL (ref 4.0–10.5)

## 2018-11-07 MED ORDER — ATORVASTATIN CALCIUM 20 MG PO TABS: 20.0000 mg | ORAL_TABLET | Freq: Every day | ORAL | 3 refills | Status: DC

## 2018-11-07 NOTE — Assessment & Plan Note (Signed)
Prefers no flu vaccine Cancer screening to start at age 48 Discussed exercise

## 2018-11-07 NOTE — Assessment & Plan Note (Signed)
Did slip and have 1 a while back Discussed he must completely abstain or his risk of recurrence is very high

## 2018-11-07 NOTE — Progress Notes (Signed)
Subjective:    Patient ID: Levi Mejia, male    DOB: 04/25/1970, 48 y.o.   MRN: [id number]  HPI Here for physical  Mostly abstinent---"close enough" Did have 1 glass of wine a few weeks ago---discussed  Hasn't used any other means--like AA  Same job Feels tired lately No missed work Sleeping is better  Hasn't been taking the statin  Current Outpatient Medications on File Prior to Visit  Medication Sig Dispense Refill  . Ascorbic Acid (VITAMIN C) 1000 MG tablet Take 1,000 mg by mouth daily.    . Melatonin 10 MG CAPS Take 10 mg by mouth at bedtime.     . feeding supplement, ENSURE ENLIVE, (ENSURE ENLIVE) LIQD Take 237 mLs by mouth 2 (two) times daily between meals. (Patient not taking: Reported on 11/07/2018) 237 mL 12   No current facility-administered medications on file prior to visit.     No Known Allergies  Past Medical History:  Diagnosis Date  . Attention deficit disorder without mention of hyperactivity     Past Surgical History:  Procedure Laterality Date  . Hand tendon reattachment  1992   Right hand  . toe reattachment  1996   Right 1st toe    Family History  Problem Relation Age of Onset  . Heart attack Father 655  . Hypertension Father   . Coronary artery disease Mother 865       Stent  . Cancer Mother        Bone  . Hypertension Mother   . Hypertension Brother   . Diabetes Neg Hx   . Prostate cancer Neg Hx   . Colon cancer Neg Hx   . Heart disease Neg Hx     Social History   Socioeconomic History  . Marital status: Divorced    Spouse name: Not on file  . Number of children: 1  . Years of education: Not on file  . Highest education level: Not on file  Occupational History  . Occupation: CuratorMechanic    Comment: Engineering [employer]  Social Needs  . Financial resource strain: Not on file  . Food insecurity    Worry: Not on file    Inability: Not on file  . Transportation needs    Medical: Not on file    Non-medical: Not on file  Tobacco  Use  . Smoking status: Never Smoker  . Smokeless tobacco: Never Used  Substance and Sexual Activity  . Alcohol use: Yes    Comment: Regular  . Drug use: No  . Sexual activity: Not on file  Lifestyle  . Physical activity    Days per week: Not on file    Minutes per session: Not on file  . Stress: Not on file  Relationships  . Social Musicianconnections    Talks on phone: Not on file    Gets together: Not on file    Attends religious service: Not on file    Active member of club or organization: Not on file    Attends meetings of clubs or organizations: Not on file    Relationship status: Not on file  . Intimate partner violence    Fear of current or ex partner: Not on file    Emotionally abused: Not on file    Physically abused: Not on file    Forced sexual activity: Not on file  Other Topics Concern  . Not on file  Social History Narrative   Divorced   Common law marriage since 2006  1 daughter   Review of Systems  Constitutional: Positive for fatigue. Negative for unexpected weight change.       No set exercise--but active at work  HENT: Negative for hearing loss, tinnitus and trouble swallowing.        Overdue for dentist--bad teeth  Eyes: Negative for visual disturbance.       No diplopia or unilateral vision loss  Respiratory: Negative for cough, chest tightness and shortness of breath.   Cardiovascular: Negative for chest pain, palpitations and leg swelling.  Gastrointestinal: Negative for abdominal pain, blood in stool and constipation.       No heartburn  Endocrine: Negative for polydipsia and polyuria.  Genitourinary: Negative for difficulty urinating and urgency.       Low libido  Musculoskeletal: Positive for arthralgias. Negative for back pain and joint swelling.       Some hand arthritis  Skin: Negative for rash.       No suspicious lesions  Allergic/Immunologic: Negative for environmental allergies and immunocompromised state.       Quiet year for  allergies  Neurological: Positive for headaches. Negative for dizziness, syncope and light-headedness.  Hematological: Negative for adenopathy. Does not bruise/bleed easily.  Psychiatric/Behavioral: Negative for dysphoric mood and sleep disturbance. The patient is not nervous/anxious.        Objective:   Physical Exam  Constitutional: He is oriented to person, place, and time. He appears well-developed. No distress.  HENT:  Head: Normocephalic and atraumatic.  Right Ear: External ear normal.  Left Ear: External ear normal.  Mouth/Throat: Oropharynx is clear and moist. No oropharyngeal exudate.  Eyes: Pupils are equal, round, and reactive to light. Conjunctivae are normal.  Neck: No thyromegaly present.  Cardiovascular: Normal rate, regular rhythm, normal heart sounds and intact distal pulses. Exam reveals no gallop.  No murmur heard. Respiratory: Effort normal and breath sounds normal. No respiratory distress. He has no wheezes. He has no rales.  GI: Soft. There is no abdominal tenderness.  Musculoskeletal:        General: No tenderness or edema.  Lymphadenopathy:    He has no cervical adenopathy.  Neurological: He is alert and oriented to person, place, and time.  Skin: No rash noted. No erythema.  Psychiatric: He has a normal mood and affect. His behavior is normal.           Assessment & Plan:

## 2018-11-07 NOTE — Assessment & Plan Note (Signed)
Findings on head CT Will have him restart the statin

## 2018-12-26 ENCOUNTER — Telehealth: Payer: Self-pay

## 2018-12-26 NOTE — Telephone Encounter (Signed)
The clonidine can help people stay away from alcohol. If he has been taking it, it would be a good idea to continue for now. If he hasn't, and is not drinking--he can probably go without it

## 2018-12-26 NOTE — Telephone Encounter (Signed)
Spoke to [name]. 

## 2018-12-26 NOTE — Telephone Encounter (Signed)
Levi Mejia (DPR signed) said pt has not been taking his med and wants to know if pt should be taking the atorvastatin (at annual exam on 11/07/18 pt was advised to restart taking the atorvastatin; Davis notified and voiced understanding. Do not see clonidine mentioned in 11/07/18 note; Dr Reatha Harps Pyreddy prescribed the clonidine last time. Pt does not need refills and [name] wants Dr Alla German opinion if pt should take the clonidine. Please advise.

## 2019-02-05 ENCOUNTER — Ambulatory Visit: Payer: 59 | Admitting: Internal Medicine

## 2019-02-05 ENCOUNTER — Other Ambulatory Visit: Payer: Self-pay

## 2019-02-05 ENCOUNTER — Encounter: Payer: Self-pay | Admitting: Internal Medicine

## 2019-02-05 DIAGNOSIS — R200 Anesthesia of skin: Secondary | ICD-10-CM | POA: Diagnosis not present

## 2019-02-05 LAB — GLUCOSE, RANDOM: Glucose, Bld: 88 mg/dL (ref 70–99)

## 2019-02-05 LAB — VITAMIN B12: Vitamin B-12: 296 pg/mL (ref 211–911)

## 2019-02-05 LAB — T4, FREE: Free T4: 0.69 ng/dL (ref 0.60–1.60)

## 2019-02-05 NOTE — Assessment & Plan Note (Addendum)
Not circulatory Doubt diabetes since sugar fine in September--but will recheck Abstinent now but I would still be concerned about thiamine Will check B12 and thyroid also Neurologist if progresses and no clear answers

## 2019-02-05 NOTE — Patient Instructions (Signed)
Please try thiamine (vitamin B1) and vitamin B12 supplements for the next month (even if the levels are normal)

## 2019-02-05 NOTE — Progress Notes (Signed)
Subjective:    Patient ID: Levi Mejia, male    DOB: August 19, 1970, 48 y.o.   MRN: [id number]  HPI Here due to sensory changes in feet Here due to concern by his partner  This visit occurred during the SARS-CoV-2 public health emergency.  Safety protocols were in place, including screening questions prior to the visit, additional usage of staff PPE, and extensive cleaning of exam room while observing appropriate contact time as indicated for disinfecting solutions.    Legs are itching Also having hair falling out on legs Feet going numb quickly--but not persistent Hand can go numb also --like if holding a phone for extended time  Current Outpatient Medications on File Prior to Visit  Medication Sig Dispense Refill  . Ascorbic Acid (VITAMIN C) 1000 MG tablet Take 1,000 mg by mouth daily.    Marland Kitchen atorvastatin (LIPITOR) 20 MG tablet Take 1 tablet (20 mg total) by mouth daily. 90 tablet 3  . Melatonin 10 MG CAPS Take 10 mg by mouth at bedtime.      No current facility-administered medications on file prior to visit.     No Known Allergies  Past Medical History:  Diagnosis Date  . Attention deficit disorder without mention of hyperactivity     Past Surgical History:  Procedure Laterality Date  . Hand tendon reattachment  1992   Right hand  . toe reattachment  1996   Right 1st toe    Family History  Problem Relation Age of Onset  . Heart attack Father 53  . Hypertension Father   . Coronary artery disease Mother 16       Stent  . Cancer Mother        Bone  . Hypertension Mother   . Hypertension Brother   . Diabetes Neg Hx   . Prostate cancer Neg Hx   . Colon cancer Neg Hx   . Heart disease Neg Hx     Social History   Socioeconomic History  . Marital status: Divorced    Spouse name: Not on file  . Number of children: 1  . Years of education: Not on file  . Highest education level: Not on file  Occupational History  . Occupation: Curator    Comment: Warden/ranger  Social Needs  . Financial resource strain: Not on file  . Food insecurity    Worry: Not on file    Inability: Not on file  . Transportation needs    Medical: Not on file    Non-medical: Not on file  Tobacco Use  . Smoking status: Never Smoker  . Smokeless tobacco: Never Used  Substance and Sexual Activity  . Alcohol use: Yes    Comment: Regular  . Drug use: No  . Sexual activity: Not on file  Lifestyle  . Physical activity    Days per week: Not on file    Minutes per session: Not on file  . Stress: Not on file  Relationships  . Social Musician on phone: Not on file    Gets together: Not on file    Attends religious service: Not on file    Active member of club or organization: Not on file    Attends meetings of clubs or organizations: Not on file    Relationship status: Not on file  . Intimate partner violence    Fear of current or ex partner: Not on file    Emotionally abused: Not on file  Physically abused: Not on file    Forced sexual activity: Not on file  Other Topics Concern  . Not on file  Social History Narrative   Divorced   Common law marriage since 2006      1 daughter   Review of Systems  Not sleeping well Some fatigue Appetite is fine Weight up some No weakness Remains abstinent    Objective:   Physical Exam  Constitutional: He appears well-developed. No distress.  Cardiovascular: Intact distal pulses.  Neurological:  Normal sensation in feet Normal strength throughout  Skin:  Some hair loss on posterior calves           Assessment & Plan:

## 2019-02-09 LAB — VITAMIN B1: Vitamin B1 (Thiamine): 7 nmol/L — ABNORMAL LOW (ref 8–30)

## 2019-05-03 ENCOUNTER — Telehealth: Payer: Self-pay

## 2019-05-03 MED ORDER — TRAZODONE HCL 50 MG PO TABS: 50.0000 mg | ORAL_TABLET | Freq: Every day | ORAL | 1 refills | Status: DC

## 2019-05-03 NOTE — Telephone Encounter (Signed)
Neysa Bonito, patient's wife (on [place]), called stating patient needs to be seen for insomnia. For the past 2 weeks he has not been able to sleep through the night, constantly waking up. Melatonin 10 mg does not help. Nothing else he has tried. Patient has discussed this with Dr Alphonsus Sias in the past per [facility]. Patient has to have an early appointment in the mornings because he can not miss work. First early appointment was on 3/30 at 7:30 and he is scheduled. Neysa Bonito is asking if patient can try something/suggestions until then or is it appropriate for another provider to see patient for this sooner?

## 2019-05-03 NOTE — Telephone Encounter (Signed)
Spoke to [place].

## 2019-05-03 NOTE — Telephone Encounter (Signed)
Please let her know that I sent a prescription for a different sleep medication for him to try in the meantime. Plan on the 3./30 appt to review how he is doing. If not helping within 2 weeks, they can contact me

## 2019-05-13 ENCOUNTER — Telehealth: Payer: Self-pay

## 2019-05-13 NOTE — Telephone Encounter (Signed)
I think that is fine. It is possible that the elevations could be related to the COVID vaccine. Will also discuss alcohol again. Will review all at tomorrow's visit

## 2019-05-13 NOTE — Telephone Encounter (Signed)
Levi Mejia DPR signed said that for couple of days pts BP has been elevated. 05/12/19 at night BP 170/103 and reck BP 150/90. 05/13/19 earlier this morning BP 150/83. Pt is not having any H/A,dizziness,CP or SOB today. Per pts current med list pt is not on BP med.Last night pt did have H/A and vomited x 1. Levi Mejia said they have no reason for pts BP to have gone up; no pain and no stress or anxiety.Pt had first Pfizer covid vaccine on 05/08/19 at [facility]. Pt has no covid symptoms today, no travel and no known exposure to + covid. Pt Is not presently on BP med. Levi Mejia scheduled appt with Dr Alphonsus Sias on 05/14/19 at 10:45. Pt is presently at work. UC & ED precautions given and Levi Mejia voiced understanding.

## 2019-05-14 ENCOUNTER — Ambulatory Visit: Payer: 59 | Admitting: Internal Medicine

## 2019-05-14 DIAGNOSIS — Z0289 Encounter for other administrative examinations: Secondary | ICD-10-CM

## 2019-05-25 ENCOUNTER — Other Ambulatory Visit: Payer: Self-pay | Admitting: Internal Medicine

## 2019-05-28 ENCOUNTER — Ambulatory Visit: Payer: 59 | Admitting: Internal Medicine

## 2019-06-05 ENCOUNTER — Ambulatory Visit: Payer: 59 | Admitting: Internal Medicine

## 2019-06-05 ENCOUNTER — Other Ambulatory Visit: Payer: Self-pay

## 2019-06-05 ENCOUNTER — Encounter: Payer: Self-pay | Admitting: Internal Medicine

## 2019-06-05 DIAGNOSIS — G479 Sleep disorder, unspecified: Secondary | ICD-10-CM | POA: Diagnosis not present

## 2019-06-05 NOTE — Assessment & Plan Note (Signed)
Some better with trazodone Limited daytime somnolence--occasional AM fatigue, but doesn't seem to be from the medication Will avoid benzos with his alcohol history  He can try 150mg  or even 200mg  at bedtime and gauge response Discussed sleep hygiene and exercise

## 2019-06-05 NOTE — Progress Notes (Signed)
Subjective:    Patient ID: Levi Mejia, male    DOB: [birth year], 49 y.o.   MRN: [id number]  HPI Here due to sleep problems This visit occurred during the SARS-CoV-2 public health emergency.  Safety protocols were in place, including screening questions prior to the visit, additional usage of staff PPE, and extensive cleaning of exam room while observing appropriate contact time as indicated for disinfecting solutions.   Normally falls asleep 9-9:30PM  Wakes 2-2:30AM and he can't get back to sleep Arises 5:30AM after just lying in bed with eyes closed Slightly tired depending on if he gets back to sleep at all Using the trazodone 100mg  --does help initiate (didn't notice much difference with 50-100) Melatonin 10mg  didn't help No clear daytime somnolence  Remains abstinent from alcohol for ~7 months  Current Outpatient Medications on File Prior to Visit  Medication Sig Dispense Refill  . Ascorbic Acid (VITAMIN C) 1000 MG tablet Take 1,000 mg by mouth daily.    atorvastatin (LIPITOR) 20 MG tablet Take 1 tablet (20 mg total) by mouth daily. 90 tablet 3  . traZODone (DESYREL) 50 MG tablet TAKE 1-2 TABLETS BY MOUTH AT BEDTIME. 180 tablet 0   No current facility-administered medications on file prior to visit.    No Known Allergies  Past Medical History:  Diagnosis Date  . Attention deficit disorder without mention of hyperactivity     Past Surgical History:  Procedure Laterality Date  . Hand tendon reattachment  1992   Right hand  . toe reattachment  1996   Right 1st toe    Family History  Problem Relation Age of Onset  . Heart attack Father 24  . Hypertension Father   . Coronary artery disease Mother 72       Stent  . Cancer Mother        Bone  . Hypertension Mother   . Hypertension Brother   . Diabetes Neg Hx   . Prostate cancer Neg Hx   . Colon cancer Neg Hx   . Heart disease Neg Hx     Social History   Socioeconomic History  . Marital status:  Divorced    Spouse name: Not on file  . Number of children: 1  . Years of education: Not on file  . Highest education level: Not on file  Occupational History  . Occupation: 53    Comment: 76  Tobacco Use  . Smoking status: Never Smoker  . Smokeless tobacco: Never Used  Substance and Sexual Activity  . Alcohol use: Yes    Comment: Regular  . Drug use: No  . Sexual activity: Not on file  Other Topics Concern  . Not on file  Social History Narrative   Divorced   Common law marriage since 2006      1 daughter   Social Determinants of Health   Financial Resource Strain:   . Difficulty of Paying Living Expenses:   Food Insecurity:   . Worried About Engineering geologist in the Last Year:   . 2007 in the Last Year:   Transportation Needs:   . Programme researcher, broadcasting/film/video (Medical):   Barista Lack of Transportation (Non-Medical):   Physical Activity:   . Days of Exercise per Week:   . Minutes of Exercise per Session:   Stress:   . Feeling of Stress :   Social Connections:   . Frequency of Communication with Friends and Family:   . Frequency  of Social Gatherings with Friends and Family:   . Attends Religious Services:   . Active Member of Clubs or Organizations:   . Attends Archivist Meetings:   Marland Kitchen Marital Status:   Intimate Partner Violence:   . Fear of Current or Ex-Partner:   . Emotionally Abused:   Marland Kitchen Physically Abused:   . Sexually Abused:    Review of Systems Appetite is fine Weight is stable BP was up a while back---but after not sleeping for 2 days No depression--mood is okay    Objective:   Physical Exam  Constitutional: He appears well-developed. No distress.  Psychiatric: He has a normal mood and affect. His behavior is normal.           Assessment & Plan:

## 2019-06-05 NOTE — Patient Instructions (Signed)
Please try 150mg  of the trazodone at bedtime for a week---and you can even try going up to 200mg . See how they help sleep and watch for side effects---like morning tiredness. Let me know if you find a better dose.

## 2019-06-12 NOTE — Telephone Encounter (Signed)
Pt seen 06/05/19.

## 2019-08-27 ENCOUNTER — Other Ambulatory Visit: Payer: Self-pay | Admitting: Internal Medicine

## 2019-08-27 NOTE — Telephone Encounter (Signed)
Left message on VM to find out how he is taking it now.

## 2019-09-27 ENCOUNTER — Telehealth: Payer: Self-pay

## 2019-09-27 NOTE — Telephone Encounter (Signed)
Haematologist (DPR signed) said that the previously prescribed sleep med Trazodone 50 mg is no longer working; pt cannot sleep thru the night and request change in med and also a referral for counseling and therapy. Pt last seen 06/05/19. No SI/HI. Neysa Bonito said pt had been usually been taking trazodone 50 mg three tabs at hs and going to bed about 9:30 pm waking at 1:00 AM and not being able to go back to sleep for at least an hour. Last night pt took trazodone 50 mg taking 5 tabs and pt slept last night. I advised per 06/05/19 appt if pt could not take more than 4 pills of the trazodone 50 mg at hs. Neysa Bonito voiced understanding and scheduled appt in office 10/04/19 at 7:30; pt to be here at 7:15 to get checked in.Neysa Bonito said has to make appt at end of next wk due to getting off work.  Neysa Bonito will cb if needed prior to appt and UC & ED precautions given and Neysa Bonito voiced understanding. Neysa Bonito understands that Dr Alphonsus Sias is not in office this afternoon and will be next wk when Dr Alphonsus Sias sees this note. [name] voiced understanding and is OK with next wk since pt has appt next wk also.

## 2019-09-29 NOTE — Telephone Encounter (Signed)
Please let him know that I do occasionally prescribe doses up to 300mg  of the trazodone at bedtime (that is typical for the long ago antidepressant dosing)  It would be okay for him to try 5 of the tabs at bedtime (or even 6) till our appt so we can review how he is doing.   (Sedative/hypnotics not appropriate for him --with alcohol history)

## 2019-09-30 NOTE — Telephone Encounter (Signed)
Spoke to pt. He will increase to up to 6 and let us know how he is doing at his visit.

## 2019-10-04 ENCOUNTER — Encounter: Payer: Self-pay | Admitting: Internal Medicine

## 2019-10-04 ENCOUNTER — Other Ambulatory Visit: Payer: Self-pay

## 2019-10-04 ENCOUNTER — Ambulatory Visit (INDEPENDENT_AMBULATORY_CARE_PROVIDER_SITE_OTHER): Payer: PRIVATE HEALTH INSURANCE | Admitting: Internal Medicine

## 2019-10-04 DIAGNOSIS — G479 Sleep disorder, unspecified: Secondary | ICD-10-CM | POA: Diagnosis not present

## 2019-10-04 NOTE — Progress Notes (Signed)
Subjective:    Patient ID: Levi Mejia, male    DOB: [birth year], 49 y.o.   MRN: [id number]  HPI Here due to ongoing sleep problems This visit occurred during the SARS-CoV-2 public health emergency.  Safety protocols were in place, including screening questions prior to the visit, additional usage of staff PPE, and extensive cleaning of exam room while observing appropriate contact time as indicated for disinfecting solutions.   Gets up for work 5:30AM Tries to initiate 9-9:30PM----occasionally 10PM Falls asleep easily and awakens 4 hours later Up for at least 30 minutes---never fully falls back asleep  Using trazodone 50 at 8:30PM, 50 at 10:30 and then 1:30 upon awakening Even if he takes 150mg  at 9PM--he still awakens at 1:30PM Feels tired in AM No real excessive daytime somnolence  Not really depressed ---but wife feels he might benefit from counseling No caffeine  Current Outpatient Medications on File Prior to Visit  Medication Sig Dispense Refill  . Ascorbic Acid (VITAMIN C) 1000 MG tablet Take 1,000 mg by mouth daily.    atorvastatin (LIPITOR) 20 MG tablet Take 1 tablet (20 mg total) by mouth daily. 90 tablet 3  . traZODone (DESYREL) 50 MG tablet TAKE 1-2 TABLETS BY MOUTH AT BEDTIME. 180 tablet 0   No current facility-administered medications on file prior to visit.    No Known Allergies  Past Medical History:  Diagnosis Date  . Attention deficit disorder without mention of hyperactivity     Past Surgical History:  Procedure Laterality Date  . Hand tendon reattachment  1992   Right hand  . toe reattachment  1996   Right 1st toe    Family History  Problem Relation Age of Onset  . Heart attack Father 63  . Hypertension Father   . Coronary artery disease Mother 95       Stent  . Cancer Mother        Bone  . Hypertension Mother   . Hypertension Brother   . Diabetes Neg Hx   . Prostate cancer Neg Hx   . Colon cancer Neg Hx   . Heart disease Neg Hx       Social History   Socioeconomic History  . Marital status: Divorced    Spouse name: Not on file  . Number of children: 1  . Years of education: Not on file  . Highest education level: Not on file  Occupational History  . 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 Smoking status: Never Smoker  . Smokeless tobacco: Never Used  Vaping Use  . Vaping Use: Never used  Substance and Sexual Activity  . Alcohol use: Yes    Comment: Regular  . Drug use: No  . Sexual activity: Not on file  Other Topics Concern  . Not on file  Social History Narrative   Divorced   Common law marriage since 2006      1 daughter   Social Determinants of Health   Financial Resource Strain:   . Difficulty of Paying Living Expenses:   Food Insecurity:   . Worried About 2007 in the Last Year:   . Programme researcher, broadcasting/film/video in the Last Year:   Transportation Needs:   . Barista (Medical):   Freight forwarder Lack of Transportation (Non-Medical):   Physical Activity:   . Days of Exercise per Week:   . Minutes of Exercise per Session:   Stress:   .  Feeling of Stress :   Social Connections:   . Frequency of Communication with Friends and Family:   . Frequency of Social Gatherings with Friends and Family:   . Attends Religious Services:   . Active Member of Clubs or Organizations:   . Attends Banker Meetings:   Marland Kitchen Marital Status:   Intimate Partner Violence:   . Fear of Current or Ex-Partner:   . Emotionally Abused:   Marland Kitchen Physically Abused:   . Sexually Abused:    Review of Systems Has been drinking occasionally---1-2 drinks per week He feels he sleeps better then    Objective:   Physical Exam Constitutional:      Appearance: Normal appearance.  Neurological:     Mental Status: He is alert.  Psychiatric:        Mood and Affect: Mood normal.        Behavior: Behavior normal.            Assessment & Plan:

## 2019-10-04 NOTE — Assessment & Plan Note (Signed)
Ongoing same issues with trouble maintaining Discussed the trazodone and can increase if needed (dividing the doses now) Asked him to start sleep compression---go to bed later and then push the initiation time earlier as her sleeps till 5:30 daily (like start at midnight) If this is ineffective, will consider mirtazapine Wife is concerned about mood?----will set up evaluation for counseling

## 2019-11-08 ENCOUNTER — Encounter: Payer: 59 | Admitting: Internal Medicine

## 2019-11-29 ENCOUNTER — Other Ambulatory Visit: Payer: Self-pay | Admitting: Internal Medicine

## 2019-12-06 ENCOUNTER — Ambulatory Visit: Payer: PRIVATE HEALTH INSURANCE | Admitting: Internal Medicine

## 2019-12-18 ENCOUNTER — Other Ambulatory Visit: Payer: Self-pay | Admitting: Internal Medicine

## 2020-03-04 ENCOUNTER — Other Ambulatory Visit: Payer: Self-pay | Admitting: Internal Medicine

## 2020-03-15 ENCOUNTER — Other Ambulatory Visit: Payer: Self-pay | Admitting: Internal Medicine

## 2020-04-30 ENCOUNTER — Telehealth: Payer: Self-pay | Admitting: Internal Medicine

## 2020-04-30 ENCOUNTER — Encounter: Payer: Self-pay | Admitting: Internal Medicine

## 2020-04-30 NOTE — Telephone Encounter (Signed)
If he is not admitted---I can fit him in tomorrow at 10:45 (the patient in that slot is in rehab). If he had a documented seizure, he will not be allowed to drive for some time

## 2020-04-30 NOTE — Telephone Encounter (Signed)
Looks like Levi Mejia put him in at 1045.

## 2020-04-30 NOTE — Telephone Encounter (Signed)
Patients wife called and stated that he was taken to [facility] by ambulance yesterday due to seizures ( this was his first one) Patient is needing to be seen asap and is wanting to go back to work . Para March, would you be willing to see this patient being Dr Alphonsus Sias will be out of the office next week? Please advise

## 2020-04-30 NOTE — Telephone Encounter (Signed)
I will defer to PCP.  Thanks.  

## 2020-05-01 ENCOUNTER — Ambulatory Visit (INDEPENDENT_AMBULATORY_CARE_PROVIDER_SITE_OTHER): Payer: PRIVATE HEALTH INSURANCE | Admitting: Internal Medicine

## 2020-05-01 ENCOUNTER — Other Ambulatory Visit: Payer: Self-pay

## 2020-05-01 ENCOUNTER — Encounter: Payer: Self-pay | Admitting: Internal Medicine

## 2020-05-01 VITALS — BP 128/90 | HR 78 | Temp 98.1°F | Ht 70.0 in | Wt 170.0 lb

## 2020-05-01 DIAGNOSIS — F10288 Alcohol dependence with other alcohol-induced disorder: Secondary | ICD-10-CM | POA: Diagnosis not present

## 2020-05-01 DIAGNOSIS — R569 Unspecified convulsions: Secondary | ICD-10-CM | POA: Diagnosis not present

## 2020-05-01 NOTE — Assessment & Plan Note (Signed)
Single seizure---wife's initial report didn't contain the seizure description but the eyewitness confirmed this ER evaluation negative Doesn't seem to have had alcohol withdrawal (no ongoing symptoms despite no Rx) Head CT negative Could have been hypoglycemia after vodka the night before and little to eat---but no confirmation of that Will set up with neurology ASAP--likely needs EEG, etc

## 2020-05-01 NOTE — Assessment & Plan Note (Signed)
He is ready to pursue counseling Will make referral

## 2020-05-01 NOTE — Progress Notes (Signed)
Subjective:    Patient ID: Levi Mejia, male    DOB: 1970/04/26, 50 y.o.   MRN: [id number]  HPI Here for follow up after seizure at work 2 days ago With wife This visit occurred during the SARS-CoV-2 public health emergency.  Safety protocols were in place, including screening questions prior to the visit, additional usage of staff PPE, and extensive cleaning of exam room while observing appropriate contact time as indicated for disinfecting solutions.   Reviewed ER records He states that fellow worker saw him while walking---stopped and fell backwards. No shaking noted. Hit head Had felt a bit dizzy a few minutes earlier--but no chest pain, SOB, etc Hadn't eaten since breakfast at that time (and event in afternoon, like 2:45PM) Coworkers rolled him on his side Rescue squad came ER doctor reported grand mal seizure --but this would have been indirect report Then "possible petit mal" in ambulance (not sure what this means)  Did have "a bit to drink" the night before (stopped by 8PM) Vodka---not a fifth (usually 2-3 large drinks a night) Wife hasn't even picked up the librium  Today, neck is sore (had cervical collar for 6 hours) Head CT and cervical CT was normal  PC with coworker who was witness He noted him on the ground with hands curl, general shaking and saliva coming out of his mouth No tongue bite  Current Outpatient Medications on File Prior to Visit  Medication Sig Dispense Refill  . Ascorbic Acid (VITAMIN C) 1000 MG tablet Take 1,000 mg by mouth daily.    Marland Kitchen atorvastatin (LIPITOR) 20 MG tablet TAKE 1 TABLET BY MOUTH EVERY DAY 90 tablet 0  . traZODone (DESYREL) 50 MG tablet TAKE 1 TO 2 TABLETS BY MOUTH AT BEDTIME 180 tablet 0  . chlordiazePOXIDE (LIBRIUM) 25 MG capsule On day 1 Take 50mg  every 6 hours by mouth as needed for symptoms of withdrawal. On day 2 Take 25mg  every 6 hours by mouth as needed for symptoms of withdrawal. (Patient not taking: Reported on 05/01/2020)      No current facility-administered medications on file prior to visit.    No Known Allergies  Past Medical History:  Diagnosis Date  . Attention deficit disorder without mention of hyperactivity     Past Surgical History:  Procedure Laterality Date  . Hand tendon reattachment  1992   Right hand  . toe reattachment  1996   Right 1st toe    Family History  Problem Relation Age of Onset  . Heart attack Father 28  . Hypertension Father   . Coronary artery disease Mother 79       Stent  . Cancer Mother        Bone  . Hypertension Mother   . Hypertension Brother   . Diabetes Neg Hx   . Prostate cancer Neg Hx   . Colon cancer Neg Hx   . Heart disease Neg Hx     Social History   Socioeconomic History  . Marital status: Divorced    Spouse name: Not on file  . Number of children: 1  . Years of education: Not on file  . Highest education level: Not on file  Occupational History  . Occupation: 53    Comment: 76  Tobacco Use  . Smoking status: Never Smoker  . Smokeless tobacco: Never Used  Vaping Use  . Vaping Use: Never used  Substance and Sexual Activity  . Alcohol use: Yes    Comment: Regular  .  Drug use: No  . Sexual activity: Not on file  Other Topics Concern  . Not on file  Social History Narrative   Divorced   Common law marriage since 2006      1 daughter   Social Determinants of Health   Financial Resource Strain: Not on file  Food Insecurity: Not on file  Transportation Needs: Not on file  Physical Activity: Not on file  Stress: Not on file  Social Connections: Not on file  Intimate Partner Violence: Not on file   Review of Systems Has lost a few pounds Not sleeping well No chest pain or SOB No headache No N/V Bowels are regular    Objective:   Physical Exam Constitutional:      Appearance: Normal appearance.  Cardiovascular:     Rate and Rhythm: Normal rate and regular rhythm.     Heart sounds: No murmur  heard. No gallop.   Pulmonary:     Effort: Pulmonary effort is normal.     Breath sounds: Normal breath sounds. No wheezing or rales.  Musculoskeletal:     Cervical back: Neck supple.     Right lower leg: No edema.     Left lower leg: No edema.  Lymphadenopathy:     Cervical: No cervical adenopathy.  Neurological:     Mental Status: He is alert.     Cranial Nerves: Cranial nerves are intact.     Motor: Motor function is intact.     Coordination: Romberg sign negative. Coordination normal. Finger-Nose-Finger Test normal.     Gait: Gait is intact.            Assessment & Plan:

## 2020-05-05 ENCOUNTER — Ambulatory Visit: Payer: PRIVATE HEALTH INSURANCE | Admitting: Neurology

## 2020-05-05 ENCOUNTER — Encounter: Payer: Self-pay | Admitting: Neurology

## 2020-05-05 ENCOUNTER — Other Ambulatory Visit: Payer: Self-pay

## 2020-05-05 VITALS — BP 141/91 | HR 90 | Ht 71.0 in | Wt 167.2 lb

## 2020-05-05 DIAGNOSIS — R55 Syncope and collapse: Secondary | ICD-10-CM | POA: Diagnosis not present

## 2020-05-05 NOTE — Progress Notes (Signed)
NEUROLOGY CONSULTATION NOTE  Levi Mejia MRN: [id number] DOB: 1970/11/20  Referring provider: Dr. Tillman Abide Primary care provider: Dr. Tillman Abide  Reason for consult:  seizures  Dear Dr Alphonsus Sias:  Thank you for your kind referral of Levi Mejia for consultation of the above symptoms. Although his history is well known to you, please allow me to reiterate it for the purpose of our medical record. The patient was accompanied to the clinic by his significant other Levi Mejia who also provides collateral information. Records and images were personally reviewed where available.  HISTORY OF PRESENT ILLNESS: This is a pleasant 50 year old right-handed man with a history of hyperlipidemia, alcohol use, presenting for evaluation of seizure that occurred last 04/29/2020 while he was at work. He has poor sleep, usually getting 4 hours of sleep, however the night prior he only got 2 hours of sleep. He woke up feeling more tired than usual and did not drink much water that day. He did not have breakfast and had a couple of bites a lunch, then at around 2:30pm he recalls walking back to his toolbox. His next recollection was waking up on the ground with EM around him. Around 20 minutes prior to the event, he recalls feeling like there was something in front of him where he held both arms up quickly, like something startled him but nothing was there. He denies any focal weakness, tongue bite or incontinence. He felt disoriented when he woke up, then was back to baseline in the ER. He felt sore the next day with a headache where he hit his head. He states he got conflicting stories on what happened. A co-worker saw him stop walking mid-stride then fall over, the rolled him on his side and someone said he was shaking a little. Witnesses said there was foam coming out of his mouth. He was brought to [facility] ER, records were reviewed. Per notes, he was brought in by EMS after a "reported 2-3 minutes grand mal  seizure, reported post-ictal state during transport with possible petit mal seizure in route." There is no description of the petit mal seizure. Bloodwork showed Na of 1332 Ca 8.4,AST 79, K 3.2, Mg 1.5. Glucose was 95. EtOH level <10, UDS negative. Head CT no acute changes. EKG showed sinus tachycardia.  He denies any prior history of seizures. He had an episode of loss of consciousness in the setting of alcohol intoxication in 2019. He was drunk and Levi Mejia heard him fall in the bathroom. She came to find him unconscious, he started waking up before EMS arrived. He was given Librium for alcohol withdrawal which "jacked me up," he was shaking so bad with tachycardia, so he went to the ER 2 weeks later because he was not feeling well. 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 They deny any staring/unresponsive episodes, gaps in time, olfactory/gustatory hallucinations, deja vu, rising epigastric sensation, focal numbness/tingling/weakness, myoclonic jerks. He denies any further headaches, no dizziness, diplopia, dysarthria/dysphagia, neck/back pain, bowel/bladder dysfunction. Memory is good. He has some RLS type symptoms when he tries to sleep. He was drinking 3-5 shots of vodka daily, but reports drinking more in the past. He has not had any alcohol since 04/28/2020.   He had a normal birth and early development.  There is no history of febrile convulsions, CNS infections such as meningitis/encephalitis, significant traumatic brain injury,  neurosurgical procedures, or family history of seizures.   PAST MEDICAL HISTORY: Past Medical History:  Diagnosis Date  . Attention deficit disorder without mention of hyperactivity     PAST SURGICAL HISTORY: Past Surgical History:  Procedure Laterality Date  . Hand tendon reattachment  1992   Right hand  . toe reattachment  1996    Right 1st toe    MEDICATIONS: Current Outpatient Medications on File Prior to Visit  Medication Sig Dispense Refill  . Ascorbic Acid (VITAMIN C) 1000 MG tablet Take 1,000 mg by mouth daily.    Marland Kitchen atorvastatin (LIPITOR) 20 MG tablet TAKE 1 TABLET BY MOUTH EVERY DAY 90 tablet 0  . traZODone (DESYREL) 50 MG tablet TAKE 1 TO 2 TABLETS BY MOUTH AT BEDTIME 180 tablet 0   No current facility-administered medications on file prior to visit.    ALLERGIES: No Known Allergies  FAMILY HISTORY: Family History  Problem Relation Age of Onset  . Heart attack Father 72  . Hypertension Father   . Coronary artery disease Mother 24       Stent  . Cancer Mother        Bone  . Hypertension Mother   . Hypertension Brother   . Diabetes Neg Hx   . Prostate cancer Neg Hx   . Colon cancer Neg Hx   . Heart disease Neg Hx     SOCIAL HISTORY: Social History   Socioeconomic History  . Marital status: Divorced    Spouse name: Not on file  . Number of children: 1  . Years of education: Not on file  . Highest education level: Not on file  Occupational History  . Occupation: Curator    Comment: Engineering geologist  Tobacco Use  . Smoking status: Never Smoker  . Smokeless tobacco: Never Used  Vaping Use  . Vaping Use: Some days  Substance and Sexual Activity  . Alcohol use: Yes    Comment: Regular  . Drug use: Yes    Types: Marijuana  . Sexual activity: Not on file  Other Topics Concern  . Not on file  Social History Narrative   Divorced   Common law marriage since 2006      1 daughter      Right handed   Social Determinants of Health   Financial Resource Strain: Not on file  Food Insecurity: Not on file  Transportation Needs: Not on file  Physical Activity: Not on file  Stress: Not on file  Social Connections: Not on file  Intimate Partner Violence: Not on file     PHYSICAL EXAM: Vitals:   05/05/20 1244  BP: (!) 141/91  Pulse: 90  SpO2: 97%   General: No acute  distress Head:  Normocephalic/atraumatic Skin/Extremities: No rash, no edema Neurological Exam: Mental status: alert and oriented to person, place, and time, no dysarthria or aphasia, Fund of knowledge is appropriate.  Recent and remote memory are intact, 3/3 delayed recall.  Attention and concentration are normal, 5/5 WORLD backward. Cranial nerves: CN I: not tested CN II: pupils equal, round and reactive to light, visual fields intact CN III, IV, VI:  full range of motion, no nystagmus, no ptosis CN V: facial sensation intact CN VII: upper and lower face symmetric CN VIII: hearing intact to conversation Bulk & Tone: normal, no fasciculations. Motor: 5/5 throughout with no pronator drift. Sensation: intact to light touch, cold, pin, vibration and joint position sense.  No extinction to double simultaneous stimulation.  Romberg test  negative Deep Tendon Reflexes: +2 throughout Cerebellar: no incoordination on finger to nose testing Gait: narrow-based and steady, able to tandem walk adequately. Tremor: none   IMPRESSION: This is a pleasant 50 year old right-handed man with a history of hyperlipidemia, alcohol use, presenting for evaluation of seizure that occurred last 04/29/2020 while he was at work. EMS reported a grand mal seizure that lasted 2-3 minutes, he reports that witness accounts are conflicting. Discussed different causes of syncope/seizure. MRI brain with and without contrast and a 1-hour EEG will be ordered to assess for focal abnormalities that increase risk for seizure. If normal, we will do a 24-hour EEG. We discussed that after an initial seizure, unless there are significant risk factors, an abnormal neurological exam, an EEG showing epileptiform abnormalities, and/or abnormal neuroimaging, treatment with an antiepileptic drug is not indicated. We discussed 10% of the population may have a single seizure. Patients with a single unprovoked seizure have a recurrence rate of 33%  after a single seizure and 73% after a second seizure. In his case, potential triggers include alcohol, sleep deprivation, lack of PO intake (although he was not hypoglycemic in the ER). He has stopped alcohol, encouraged to continue with alcohol avoidance. We discussed [place] driving restrictions which indicate a patient needs to free of seizures or events of altered awareness for 6 months prior to resuming driving. Follow-up in 4-5 months, they know to call for any changes.   Thank you for allowing me to participate in the care of this patient. Please do not hesitate to call for any questions or concerns.   Patrcia Dolly, M.D.  CC: Dr. Alphonsus Sias

## 2020-05-05 NOTE — Patient Instructions (Signed)
1. Schedule MRI brain with and without contrast at [facility]  2. Schedule 1-hour EEG, if normal we will do a 24-hour EEG  3. As per [place] driving laws, no driving after an episode of loss of consciousness until 6 months event-free  4. Follow-up in 4-5 months, call for any changes

## 2020-05-18 ENCOUNTER — Other Ambulatory Visit: Payer: Self-pay

## 2020-05-18 ENCOUNTER — Ambulatory Visit (INDEPENDENT_AMBULATORY_CARE_PROVIDER_SITE_OTHER): Payer: PRIVATE HEALTH INSURANCE | Admitting: Neurology

## 2020-05-18 DIAGNOSIS — R55 Syncope and collapse: Secondary | ICD-10-CM

## 2020-05-22 ENCOUNTER — Telehealth: Payer: Self-pay

## 2020-05-22 NOTE — Telephone Encounter (Signed)
Pt called and informed that 1-hour EEG was normal, proceed with prolonged EEG as scheduled. Pt verbalized understanding

## 2020-05-22 NOTE — Procedures (Signed)
ELECTROENCEPHALOGRAM REPORT  Date of Study: 05/18/2020  Patient's Name: Levi Mejia MRN: [id number] Date of Birth: [birth year]  Referring Provider: Dr. Patrcia Dolly  Clinical History: This is a 50 year old man with new onset seizure. EEG for classification.  Medications: VITAMIN C 1000 MG tablet LIPITOR 20 MG tablet DESYREL 50 MG tablet  Technical Summary: A multichannel digital 1-hour EEG recording measured by the international 10-20 system with electrodes applied with paste and impedances below 5000 ohms performed in our laboratory with EKG monitoring in an awake and drowsy patient.  Hyperventilation was not performed. Photic stimulation was performed.  The digital EEG was referentially recorded, reformatted, and digitally filtered in a variety of bipolar and referential montages for optimal display.    Description: The patient is awake and drowsy during the recording.  During maximal wakefulness, there is a symmetric, medium voltage 9 Hz posterior dominant rhythm that attenuates with eye opening.  The record is symmetric.  There is an excess amount of diffuse low voltage beta activity seen throughout the recording. During drowsiness, there is an increase in theta slowing of the background. Sleep was not captured. Photic stimulation did not elicit any abnormalities.  There were no epileptiform discharges or electrographic seizures seen.    EKG lead was unremarkable.  Impression: This 1-hour awake and drowsy EEG is normal except for excess amount of diffuse low voltage beta activity.  Clinical Correlation: Diffuse low voltage beta activity is commonly seen with sedating medications such as benzodiazepines.  In the absence of sedating medications, anxiety and hyperthyroidism may produce generalized beta activity.  The absence of epileptiform discharges does not exclude a clinical diagnosis of epilepsy.  If further clinical questions remain, prolonged EEG may be helpful.  Clinical  correlation is advised.   Patrcia Dolly, M.D.

## 2020-05-22 NOTE — Telephone Encounter (Signed)
-----   Message from Van Clines, MD sent at 05/22/2020 12:46 PM EDT ----- Pls let him know the 1-hour EEG was normal, proceed with prolonged EEG as scheduled, thanks

## 2020-05-28 ENCOUNTER — Telehealth: Payer: Self-pay

## 2020-05-28 NOTE — Telephone Encounter (Signed)
MRI scheduled April 5th at 5:00 pm, to arrive at 4:30 medical mall at [place].

## 2020-06-02 ENCOUNTER — Ambulatory Visit: Payer: PRIVATE HEALTH INSURANCE

## 2020-06-05 ENCOUNTER — Telehealth: Payer: Self-pay | Admitting: Neurology

## 2020-06-05 NOTE — Telephone Encounter (Signed)
Is this okay to refer out, if so where in [place]?

## 2020-06-05 NOTE — Telephone Encounter (Signed)
[facility] radiology

## 2020-06-05 NOTE — Telephone Encounter (Signed)
Would ask wife which radiology place in [place] she found that takes his insurance, then ok to send there, thanks

## 2020-06-15 ENCOUNTER — Telehealth: Payer: Self-pay

## 2020-06-15 NOTE — Telephone Encounter (Signed)
Patient is to contact office back with confirmation of the actually place for MRI. He is to contact office back .

## 2020-06-16 NOTE — Telephone Encounter (Signed)
Patient called back in and said that due to his insurance they need the MRI referral sent to [facility] after all. They will need the referral sent to them.

## 2020-06-17 ENCOUNTER — Telehealth: Payer: Self-pay

## 2020-06-17 NOTE — Telephone Encounter (Signed)
Patient's fiance Neysa Bonito called and left a message, stating he needs to reschedule his 24 hour ambulatory EEG because he is unable to get off work. Called him back and he said he will call us to reschedule when he is able. I informed him we are booked through the middle of May at this point.

## 2020-06-17 NOTE — Telephone Encounter (Signed)
Patient notified of MRI with and without . Faxed order to [phone] with authroization approval. April 28-2022. Thursday at 1045 to arrive at 1015. [address] [facility]. . Orders completed and patient verbalized understanding.

## 2020-06-17 NOTE — Telephone Encounter (Signed)
Sent to [place] to confirm authorization, once approved will call [place] radiology

## 2020-06-24 ENCOUNTER — Other Ambulatory Visit: Payer: PRIVATE HEALTH INSURANCE

## 2020-07-01 ENCOUNTER — Telehealth: Payer: Self-pay

## 2020-07-01 NOTE — Telephone Encounter (Signed)
-----   Message from Van Clines, MD sent at 07/01/2020  1:15 PM EDT ----- Pls let patient/wife know the MRI brain did not show any evidence of tumor, stroke, or bleed. Proceed with 24-hour EEG when able, thanks  ----- Message ----- From: Sharma Covert Sent: 06/26/2020   7:29 AM EDT To: Van Clines, MD

## 2020-07-01 NOTE — Telephone Encounter (Signed)
Pt called and informed that MRI brain did not show any evidence of tumor, stroke, or bleed. Proceed with 24-hour EEG when able

## 2020-07-09 IMAGING — CT CT HEAD W/O CM
3 series · 15 of 47 positions shown, 18 images · non-contrast
Comparison: None.

CLINICAL DATA: Acute onset of syncope and confusion.

EXAM:
CT HEAD WITHOUT CONTRAST
TECHNIQUE: Contiguous axial images were obtained from the base of the skull
through the vertex without intravenous contrast.

[Series 2: head wo · axial · 0.48mm/px · z∈[-36,+89]mm · 9 of 31 slices shown, 12 images]
[im 3/31  brain]
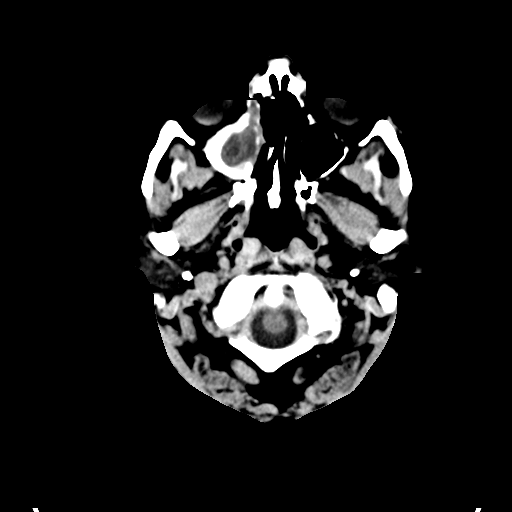
[im 3/31  bone]
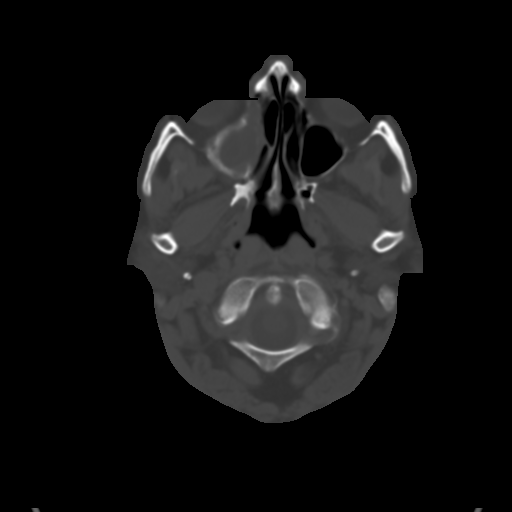
[im 6/31  brain]
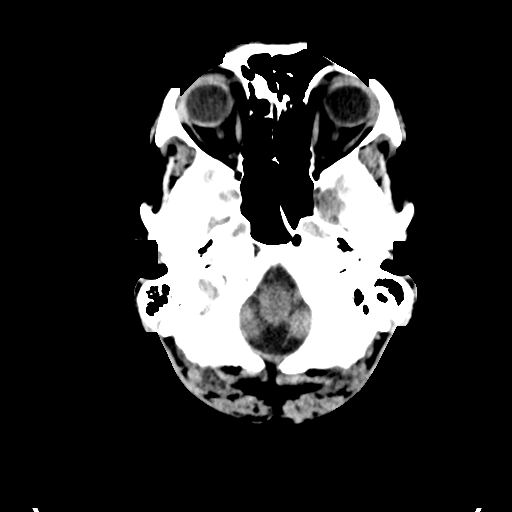
[im 9/31  brain]
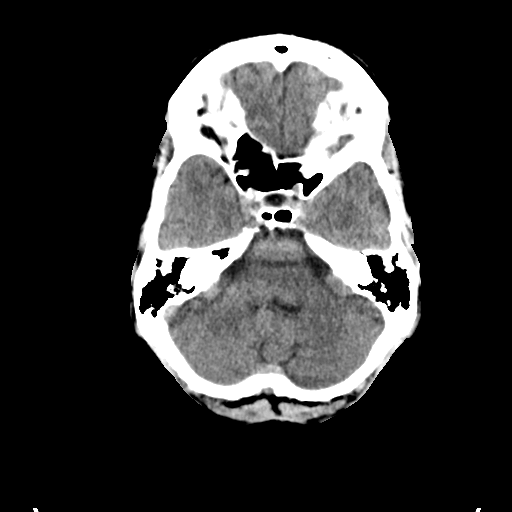
[im 12/31  brain]
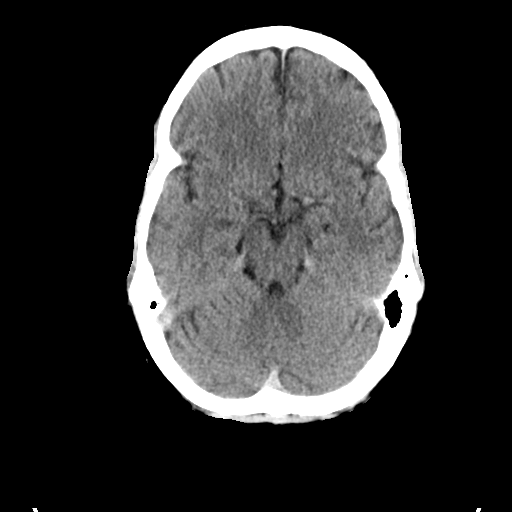
[im 16/31  brain]
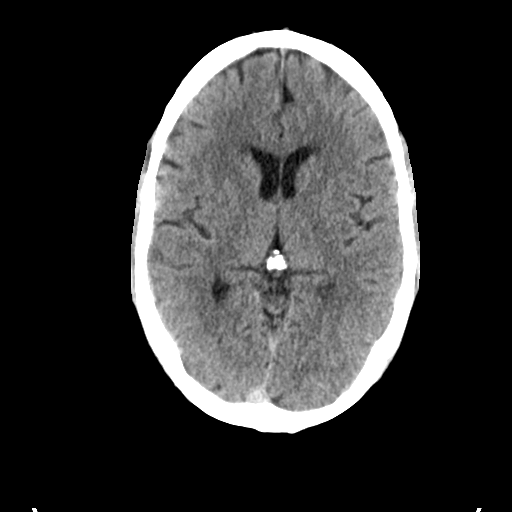
[im 16/31  bone]
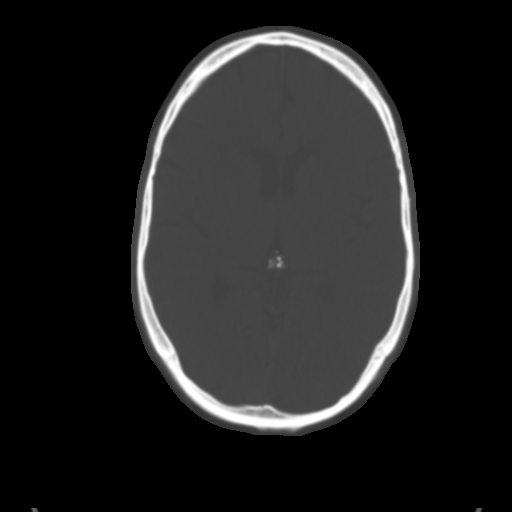
[im 19/31  brain]
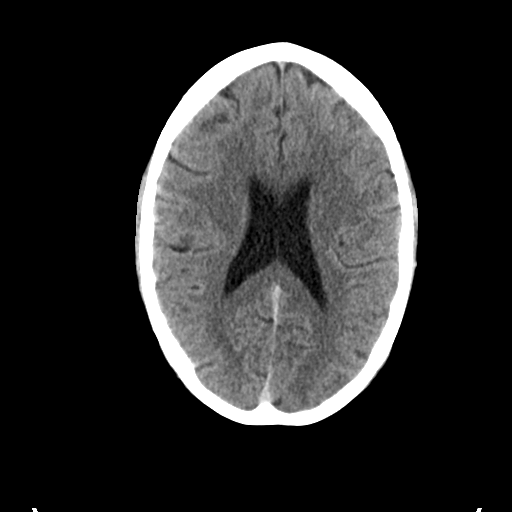
[im 22/31  brain]
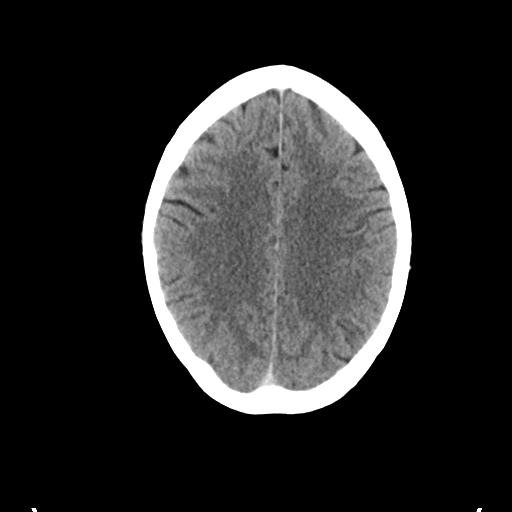
[im 25/31  brain]
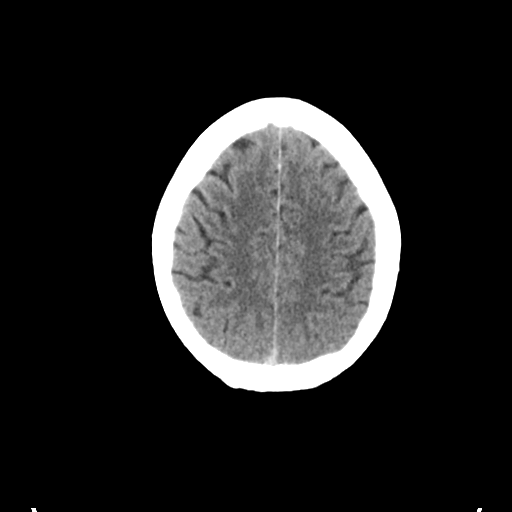
[im 28/31  brain]
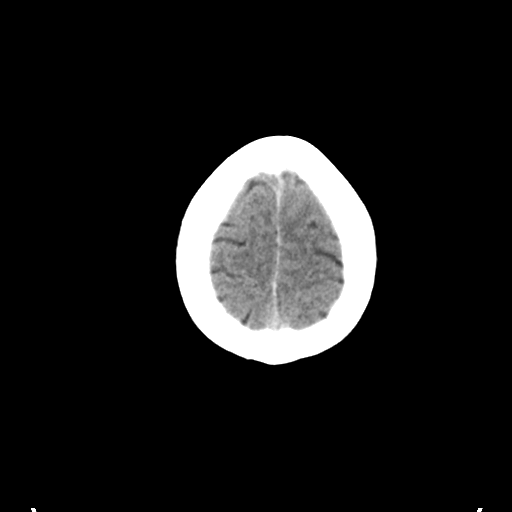
[im 28/31  bone]
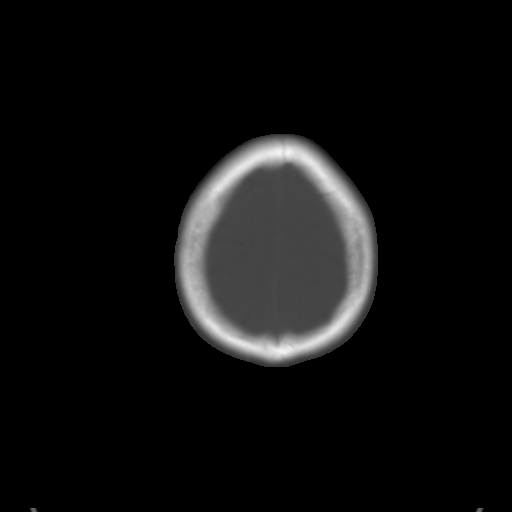

[Series 4: coronal soft tissue · coronal · 0.34mm/px · 3 of 67 slices shown]
[im 23/67  brain]
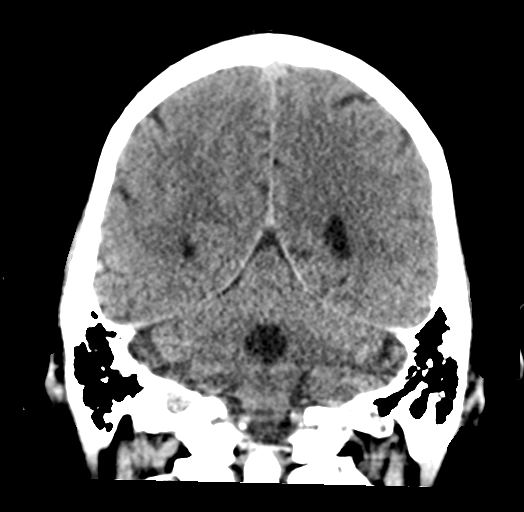
[im 30/67  brain]
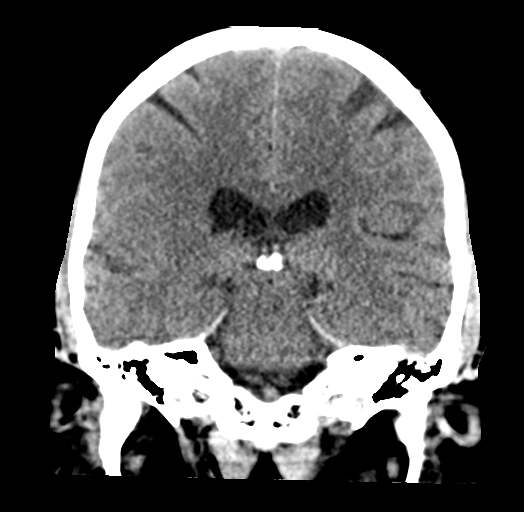
[im 37/67  brain]
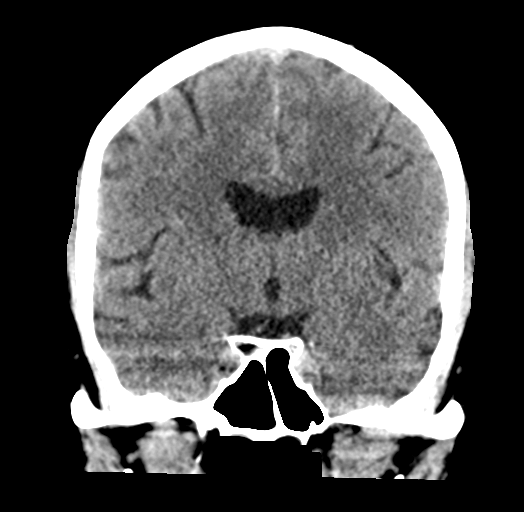

[Series 5: sagittal soft tissue · sagittal · 0.34mm/px · 3 of 59 slices shown]
[im 20/59  brain]
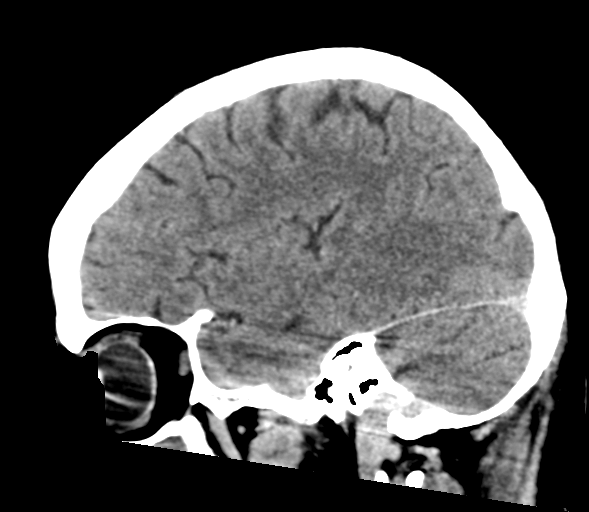
[im 30/59  brain]
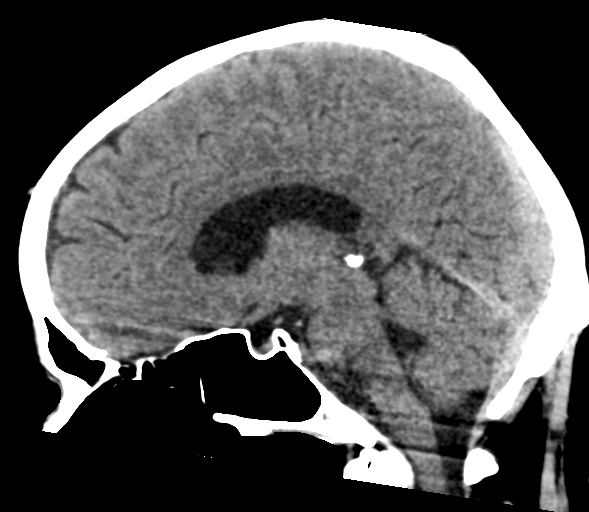
[im 39/59  brain]
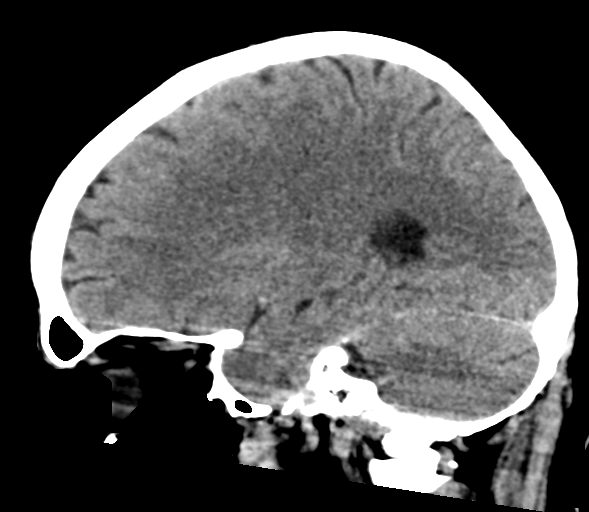

[15 of 47 positions shown; findings below may reference images not displayed]

FINDINGS: Brain: No evidence of acute infarction, hemorrhage, hydrocephalus,
extra-axial collection or mass lesion/mass effect.

Mild periventricular white matter change likely reflects small
vessel ischemic microangiopathy. A small chronic lacunar infarct is
noted at the right mid corona radiata.

The posterior fossa, including the cerebellum, brainstem and fourth
ventricle, is within normal limits. The third and lateral
ventricles, and basal ganglia are unremarkable in appearance. The
cerebral hemispheres are symmetric in appearance, with normal
gray-white differentiation. No mass effect or midline shift is seen.

Vascular: No hyperdense vessel or unexpected calcification.

Skull: There is no evidence of fracture; visualized osseous
structures are unremarkable in appearance.

Sinuses/Orbits: The orbits are within normal limits. There is
opacification of the right maxillary sinus. The remaining paranasal
sinuses and mastoid air cells are well-aerated.

Other: No significant soft tissue abnormalities are seen.
IMPRESSION: 1. No acute intracranial pathology seen on CT.
2. Mild small vessel ischemic microangiopathy.
3. Small chronic lacunar infarct at the right mid corona radiata.
4. Opacification of the right maxillary sinus.

## 2020-09-09 ENCOUNTER — Ambulatory Visit: Payer: PRIVATE HEALTH INSURANCE | Admitting: Neurology

## 2021-01-15 ENCOUNTER — Other Ambulatory Visit: Payer: Self-pay | Admitting: Internal Medicine

## 2021-04-05 ENCOUNTER — Ambulatory Visit: Payer: PRIVATE HEALTH INSURANCE | Admitting: Internal Medicine

## 2021-04-05 ENCOUNTER — Telehealth: Payer: Self-pay

## 2021-04-05 NOTE — Telephone Encounter (Signed)
[facility] Night - Client Nonclinical Telephone Record  [facility] Client [facility] Night - Client Client Site [facility] - Night Provider Tillman Abide- MD Contact Type Call Who Is Calling Patient / Member / Family / Caregiver Caller Name Laney Bagshaw Caller Phone Number [phone] Patient Name Levi Mejia Patient DOB October 30, 1970 Call Type Message Only Information Provided Reason for Call Request to [facility] Appointment Initial Comment Caller is needing to cancel 4 pm appointment for today. Patient request to speak to RN No Additional Comment Declined triage. Disp. Time Disposition Final User 04/05/2021 7:15:13 AM General Information Provided Yes Bjorn Loser Call Closed By: Bjorn Loser Transaction Date/Time: 04/05/2021 7:13:04 AM (ET

## 2021-04-05 NOTE — Telephone Encounter (Signed)
Appt has already been cancelled.

## 2021-04-13 ENCOUNTER — Other Ambulatory Visit: Payer: Self-pay | Admitting: Internal Medicine

## 2021-05-20 ENCOUNTER — Other Ambulatory Visit: Payer: Self-pay

## 2021-05-20 ENCOUNTER — Telehealth: Payer: Self-pay

## 2021-05-20 DIAGNOSIS — Z1211 Encounter for screening for malignant neoplasm of colon: Secondary | ICD-10-CM

## 2021-05-20 MED ORDER — NA SULFATE-K SULFATE-MG SULF 17.5-3.13-1.6 GM/177ML PO SOLN: 1.0000 | Freq: Once | ORAL | 0 refills | Status: AC

## 2021-05-20 NOTE — Progress Notes (Signed)
Gastroenterology Pre-Procedure Review ? ?Request Date: 06/14/2021 ?Requesting Physician: Dr. Marius Ditch ? ?PATIENT REVIEW QUESTIONS: The patient responded to the following health history questions as indicated:   ? ?1. Are you having any GI issues? no ?2. Do you have a personal history of Polyps? no ?3. Do you have a family history of Colon Cancer or Polyps? no ?4. Diabetes Mellitus? no ?5. Joint replacements in the past 12 months?no ?6. Major health problems in the past 3 months?no ?7. Any artificial heart valves, MVP, or defibrillator?no ?   ?MEDICATIONS & ALLERGIES:    ?Patient reports the following regarding taking any anticoagulation/antiplatelet therapy:   ?Plavix, Coumadin, Eliquis, Xarelto, Lovenox, Pradaxa, Brilinta, or Effient? no ?Aspirin? no ? ?Patient confirms/reports the following medications:  ?Current Outpatient Medications  ?Medication Sig Dispense Refill  ? Ascorbic Acid (VITAMIN C) 1000 MG tablet Take 1,000 mg by mouth daily.    ? atorvastatin (LIPITOR) 20 MG tablet TAKE 1 TABLET BY MOUTH EVERY DAY 90 tablet 0  ? traZODone (DESYREL) 50 MG tablet Take 1-2 tablets (50-100 mg total) by mouth at bedtime. NEEDS OFFICE VISIT 180 tablet 0  ? ?No current facility-administered medications for this visit.  ? ? ?Patient confirms/reports the following allergies:  ?No Known Allergies ? ?No orders of the defined types were placed in this encounter. ? ? ?AUTHORIZATION INFORMATION ?Primary Insurance: ?1D#: ?Group #: ? ?Secondary Insurance: ?1D#: ?Group #: ? ?SCHEDULE INFORMATION: ?Date: 06/14/2021 ?Time: ?[place] ? ?

## 2021-05-20 NOTE — Telephone Encounter (Signed)
CALLED PATIENT NO ANSWER LEFT VOICEMAIL FOR A CALL BACK ? ?

## 2021-06-02 ENCOUNTER — Encounter: Payer: Self-pay | Admitting: Family Medicine

## 2021-06-07 ENCOUNTER — Telehealth: Payer: Self-pay | Admitting: Gastroenterology

## 2021-06-07 ENCOUNTER — Other Ambulatory Visit: Payer: Self-pay

## 2021-06-07 DIAGNOSIS — Z1211 Encounter for screening for malignant neoplasm of colon: Secondary | ICD-10-CM

## 2021-06-07 MED ORDER — GOLYTELY 236 G PO SOLR: 4000.0000 mL | Freq: Once | ORAL | 0 refills | Status: AC

## 2021-06-07 NOTE — Telephone Encounter (Signed)
Pt is needing a cheaper prep medication for colonoscpy  ?

## 2021-06-11 ENCOUNTER — Encounter: Payer: Self-pay | Admitting: Gastroenterology

## 2021-06-14 ENCOUNTER — Ambulatory Visit
Admission: RE | Admit: 2021-06-14 | Payer: PRIVATE HEALTH INSURANCE | Source: Home / Self Care | Admitting: Gastroenterology

## 2021-06-14 ENCOUNTER — Encounter: Admission: RE | Payer: Self-pay | Source: Home / Self Care

## 2021-06-14 SURGERY — COLONOSCOPY WITH PROPOFOL
Anesthesia: General

## 2021-06-21 ENCOUNTER — Telehealth: Payer: Self-pay | Admitting: Gastroenterology

## 2021-06-21 NOTE — Telephone Encounter (Signed)
Levi Mejia requested medical records for PTS colonoscopy  the procedure was cancelled  so there are no medical records. ?

## 2021-08-25 ENCOUNTER — Telehealth: Payer: Self-pay | Admitting: Gastroenterology

## 2021-08-25 NOTE — Telephone Encounter (Signed)
Ethelene Browns, NP called to check up on status of patients referral. 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 She states that it is okay if the patient gets the colonoscopy before being seen in the office.

## 2021-08-25 NOTE — Telephone Encounter (Signed)
Is it okay for patient to have colonoscopy before being seen in November

## 2021-08-26 ENCOUNTER — Telehealth: Payer: Self-pay

## 2021-08-26 NOTE — Telephone Encounter (Signed)
Patient requested he be scheduled next year for procedure and he will call us back when he is ready to schedule

## 2021-08-26 NOTE — Telephone Encounter (Signed)
Opened in Error.  Coworker has already returned call.  Thanks, [place], [place]

## 2021-09-23 ENCOUNTER — Other Ambulatory Visit: Payer: Self-pay

## 2021-09-23 ENCOUNTER — Emergency Department
Admission: EM | Admit: 2021-09-23 | Discharge: 2021-09-23 | Disposition: A | Discharge status: Home / Self Care | Payer: Worker's Compensation | Attending: Emergency Medicine | Admitting: Emergency Medicine

## 2021-09-23 ENCOUNTER — Emergency Department: Payer: Worker's Compensation

## 2021-09-23 DIAGNOSIS — R55 Syncope and collapse: Secondary | ICD-10-CM | POA: Diagnosis not present

## 2021-09-23 DIAGNOSIS — R42 Dizziness and giddiness: Secondary | ICD-10-CM | POA: Diagnosis present

## 2021-09-23 DIAGNOSIS — E876 Hypokalemia: Secondary | ICD-10-CM | POA: Diagnosis not present

## 2021-09-23 DIAGNOSIS — E860 Dehydration: Secondary | ICD-10-CM | POA: Diagnosis not present

## 2021-09-23 DIAGNOSIS — T675XXA Heat exhaustion, unspecified, initial encounter: Secondary | ICD-10-CM | POA: Insufficient documentation

## 2021-09-23 DIAGNOSIS — I10 Essential (primary) hypertension: Secondary | ICD-10-CM | POA: Insufficient documentation

## 2021-09-23 LAB — CK: Total CK: 273 U/L (ref 49–397)

## 2021-09-23 LAB — COMPREHENSIVE METABOLIC PANEL
ALT: 165 U/L — ABNORMAL HIGH (ref 0–44)
AST: 209 U/L — ABNORMAL HIGH (ref 15–41)
Albumin: 4.5 g/dL (ref 3.5–5.0)
Alkaline Phosphatase: 67 U/L (ref 38–126)
Anion gap: 16 — ABNORMAL HIGH (ref 5–15)
BUN: 15 mg/dL (ref 6–20)
CO2: 24 mmol/L (ref 22–32)
Calcium: 8.7 mg/dL — ABNORMAL LOW (ref 8.9–10.3)
Chloride: 91 mmol/L — ABNORMAL LOW (ref 98–111)
Creatinine, Ser: 1.35 mg/dL — ABNORMAL HIGH (ref 0.61–1.24)
GFR, Estimated: >60 mL/min (ref >60)
Glucose, Bld: 102 mg/dL — ABNORMAL HIGH (ref 70–99)
Potassium: 2.8 mmol/L — ABNORMAL LOW (ref 3.5–5.1)
Sodium: 131 mmol/L — ABNORMAL LOW (ref 135–145)
Total Bilirubin: 2.2 mg/dL — ABNORMAL HIGH (ref 0.3–1.2)
Total Protein: 8.6 g/dL — ABNORMAL HIGH (ref 6.5–8.1)

## 2021-09-23 LAB — CBC
HCT: 43.7 % (ref 39.0–52.0)
Hemoglobin: 15.7 g/dL (ref 13.0–17.0)
MCH: 34.2 pg — ABNORMAL HIGH (ref 26.0–34.0)
MCHC: 35.9 g/dL (ref 30.0–36.0)
MCV: 95.2 fL (ref 80.0–100.0)
Platelets: 182 10*3/uL (ref 150–400)
RBC: 4.59 MIL/uL (ref 4.22–5.81)
RDW: 11.5 % (ref 11.5–15.5)
WBC: 13.3 10*3/uL — ABNORMAL HIGH (ref 4.0–10.5)
nRBC: 0 % (ref 0.0–0.2)

## 2021-09-23 LAB — CBG MONITORING, ED: Glucose-Capillary: 115 mg/dL — ABNORMAL HIGH (ref 70–99)

## 2021-09-23 LAB — TROPONIN I (HIGH SENSITIVITY)
Troponin I (High Sensitivity): 6 ng/L (ref <18)
Troponin I (High Sensitivity): 6 ng/L (ref <18)

## 2021-09-23 MED ORDER — LACTATED RINGERS IV BOLUS
2000.0000 mL | Freq: Once | INTRAVENOUS | Status: AC
  Administered 2021-09-23: 2000 mL via INTRAVENOUS

## 2021-09-23 MED ORDER — POTASSIUM CHLORIDE CRYS ER 20 MEQ PO TBCR
40.0000 meq | EXTENDED_RELEASE_TABLET | Freq: Once | ORAL | Status: AC
  Administered 2021-09-23: 40 meq via ORAL
  Filled 2021-09-23: qty 2

## 2021-09-23 MED ORDER — SODIUM CHLORIDE 0.9 % IV BOLUS: 2000.0000 mL | Freq: Once | INTRAVENOUS | Status: DC

## 2021-09-23 MED ORDER — LORAZEPAM 2 MG/ML IJ SOLN
INTRAMUSCULAR | Status: AC
  Filled 2021-09-23: qty 1

## 2021-09-23 NOTE — ED Notes (Signed)
Pt rushed back from triage, per Elana, RN pt was getting blood work obtained had a sudden onset of near syncopal episode, where patient vomited and then suddenly became incoherent, diaphoretic and per triage RN, pt began posturing. On arrival to room, pt is A&Ox4 and NAD but is sweaty. Possible seizure but not post-ictal. Dr. Vicente Males, EDP at bedside at this time.

## 2021-09-23 NOTE — ED Provider Notes (Signed)
[facility] Provider Note   Event Date/Time   First MD Initiated Contact with Patient 09/23/21 1408     (approximate) History  Dizziness (Pt. To ED via POV with generalized aching, nausea, vomiting, dizziness, HA, and low appetite x2 days. Pt. States he had similar episode 2 weeks pta, but it was worse, but he never fully regained his strength. )  HPI Levi Mejia is a 51 y.o. male with a past medical history of hypertension who presents for generalized weakness, muscle aches, nausea/vomiting, and profuse sweating that occurred today while working in his [employer] shop.  Patient states that he had similar symptoms approximately 1 week prior to arrival that was diagnosed as a heat exhaustion/heat stroke.  Patient denies any altered mental status but states that he laid down in front of a fan with mild resolution of his symptoms.  Of note, patient was immediately brought back to room after he was having his blood drawn and felt extremely lightheaded before losing consciousness and having some reported decerebrate posturing for approximately 1 minute prior to return to baseline.  Patient awoke immediately and return to baseline within 2 minutes.  Patient states that the last thing he remembers was being told to sit down and then remembering folks telling him to open his eyes.  Patient now only complains of diaphoresis and body aches ROS: Patient currently denies any vision changes, tinnitus, difficulty speaking, facial droop, sore throat, chest pain, shortness of breath, abdominal pain, nausea/vomiting/diarrhea, dysuria, or weakness/numbness/paresthesias in any extremity   Physical Exam  Triage Vital Signs: ED Triage Vitals  Enc Vitals Group     BP 09/23/21 1342 115/84     Pulse Rate 09/23/21 1342 (!) 103     Resp 09/23/21 1342 20     Temp 09/23/21 1342 98.3 F (36.8 C)     Temp Source 09/23/21 1342 Oral     SpO2 09/23/21 1342 98 %     Weight 09/23/21 1343 160 lb  (72.6 kg)     Height 09/23/21 1343 5\' 11"  (1.803 m)     Head Circumference --      Peak Flow --      Pain Score 09/23/21 1343 6     Pain Loc --      Pain Edu? --      Excl. in [place]? --    Most recent vital signs: Vitals:   09/23/21 1730 09/23/21 1747  BP: 120/90   Pulse: 97   Resp: 17   Temp:  98.6 F (37 C)  SpO2: 97%    General: Awake, oriented x4. CV:  Good peripheral perfusion.  Resp:  Normal effort.  Abd:  No distention.  Other:  Middle-aged diaphoretic Caucasian male sitting in bed in no acute distress ED Results / Procedures / Treatments  Labs (all labs ordered are listed, but only abnormal results are displayed) Labs Reviewed  COMPREHENSIVE METABOLIC PANEL - Abnormal; Notable for the following components:      Result Value   Sodium 131 (*)    Potassium 2.8 (*)    Chloride 91 (*)    Glucose, Bld 102 (*)    Creatinine, Ser 1.35 (*)    Calcium 8.7 (*)    Total Protein 8.6 (*)    AST 209 (*)    ALT 165 (*)    Total Bilirubin 2.2 (*)    Anion gap 16 (*)    All other components within normal limits  CBC - Abnormal; Notable for  the following components:   WBC 13.3 (*)    MCH 34.2 (*)    All other components within normal limits  CBG MONITORING, ED - Abnormal; Notable for the following components:   Glucose-Capillary 115 (*)    All other components within normal limits  CK  TROPONIN I (HIGH SENSITIVITY)  TROPONIN I (HIGH SENSITIVITY)   EKG ED ECG REPORT I, Merwyn Katos, the attending physician, personally viewed and interpreted this ECG. Date: 09/23/2021 EKG Time: 1405 Rate: 89 Rhythm: normal sinus rhythm QRS Axis: normal Intervals: normal ST/T Wave abnormalities: normal Narrative Interpretation: no evidence of acute ischemia RADIOLOGY ED MD interpretation: One-view portable chest x-ray interpreted by me shows no evidence of acute abnormalities including no pneumonia, pneumothorax, or widened mediastinum -Agree with radiology assessment Official  radiology report(s): DG Chest Port 1 View  Result Date: 09/23/2021 CLINICAL DATA:  Syncope, dizziness EXAM: PORTABLE CHEST 1 VIEW COMPARISON:  04/15/2011 FINDINGS: The heart size and mediastinal contours are within normal limits. Both lungs are clear. The visualized skeletal structures are unremarkable. IMPRESSION: No active disease. Electronically Signed   By: Ernie Avena M.D.   On: 09/23/2021 14:49   PROCEDURES: Critical Care performed: No .1-3 Lead EKG Interpretation  Performed by: Merwyn Katos, MD Authorized by: Merwyn Katos, MD     Interpretation: normal     ECG rate:  95   ECG rate assessment: normal     Rhythm: sinus rhythm     Ectopy: none     Conduction: normal    MEDICATIONS ORDERED IN ED: Medications  lactated ringers bolus 2,000 mL (0 mLs Intravenous Stopped 09/23/21 1642)  potassium chloride SA (KLOR-CON M) CR tablet 40 mEq (40 mEq Oral Given 09/23/21 1503)   IMPRESSION / MDM / ASSESSMENT AND PLAN / ED COURSE  I reviewed the triage vital signs and the nursing notes.                             The patient is on the cardiac monitor to evaluate for evidence of arrhythmia and/or significant heart rate changes.**} Patient's presentation is most consistent with acute presentation with potential threat to life or bodily function. This patient presents with generalized weakness and fatigue likely secondary to dehydration. Suspect acute kidney injury of prerenal origin. Doubt intrinsic renal dysfunction or obstructive nephropathy. Considered alternate etiologies of the patients symptoms including infectious processes, severe metabolic derangements or electrolyte abnormalities, ischemia/ACS, heart failure, and intracranial/central processes but think these are unlikely given the history and physical exam.  Plan: labs, 2L fluid resuscitation, pain/nausea control, reassessment  Dispo: Discharge home with instructions for rehydration   FINAL CLINICAL IMPRESSION(S) / ED  DIAGNOSES   Final diagnoses:  Dehydration  Vasovagal syncope  Heat exhaustion, initial encounter  Hypokalemia   Rx / DC Orders   ED Discharge Orders     None      Note:  This document was prepared using Dragon voice recognition software and may include unintentional dictation errors.   Merwyn Katos, MD 09/24/21 [phone]

## 2021-09-23 NOTE — ED Triage Notes (Signed)
Pt. To ED via POV with generalized aching, nausea, vomiting, dizziness, HA, and low appetite x2 days. Pt. States he had similar episode 2 weeks pta, but it was worse, but he never fully regained his strength.

## 2021-09-23 NOTE — ED Provider Notes (Signed)
Procedures     ----------------------------------------- 5:33 PM on 09/23/2021 ----------------------------------------- IV fluid bolus finished, patient reports that he feels great and symptoms are resolved.  Vital signs are normal.  Stable for discharge     Sharman Cheek, MD 09/23/21 1734

## 2022-01-06 ENCOUNTER — Other Ambulatory Visit: Payer: Self-pay

## 2022-01-10 ENCOUNTER — Ambulatory Visit (INDEPENDENT_AMBULATORY_CARE_PROVIDER_SITE_OTHER): Payer: PRIVATE HEALTH INSURANCE | Admitting: Gastroenterology

## 2022-01-10 ENCOUNTER — Encounter: Payer: Self-pay | Admitting: Gastroenterology

## 2022-01-10 VITALS — BP 125/80 | HR 74 | Temp 99.0°F | Ht 71.0 in | Wt 165.4 lb

## 2022-01-10 DIAGNOSIS — R748 Abnormal levels of other serum enzymes: Secondary | ICD-10-CM | POA: Diagnosis not present

## 2022-01-10 NOTE — Progress Notes (Signed)
Arlyss Repress, MD [address]  [address]  [place], [place] [address]  Main: [phone]  Fax: [phone]    Gastroenterology Consultation  Referring Provider:     Ethelene Browns, NP Primary Care Physician:  Ethelene Browns, NP Primary Gastroenterologist:  Dr. Arlyss Repress Reason for Consultation: Elevated LFTs        HPI:   Levi Mejia is a 51 y.o. male referred by Ethelene Browns, NP  for consultation & management of elevated LFTs.  Patient is found to have elevated transaminases since March 2022.  He reports history of heavy alcohol use since age of 29.  Patient was admitted to [facility] on 12/20/2021 secondary to seizure activity as well as found to have right occipital lobe intraparenchymal hemorrhage.  Patient was drinking alcohol at that time.  Patient denies having any neurologic deficit from that event.  He is found to have elevated transaminases AST 287, ALT 94, serum alcohol levels were detectable.  Patient reports that since then, he has completely stopped drinking alcohol.  He used to drink 1 pint of liquor daily.  Patient is referred to GI for elevated LFTs.  He is also found to have elevated iron levels and percent iron saturation was 65%.  Patient denies any abdominal pain, rectal bleeding, jaundice, dark urine, yellowing of skin or yellowing of eyes, swelling of legs.  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  He does not smoke cigarettes.  No evidence of anemia or thrombocytopenia.  No known history of chronic hepatitis B or C.  He denies any family history of liver disease, except alcohol use  NSAIDs: None  Antiplts/Anticoagulants/Anti thrombotics: None  GI Procedures: None  Past Medical History:  Diagnosis Date   Attention deficit disorder without mention of hyperactivity     Past Surgical History:  Procedure Laterality Date   Hand tendon reattachment  1992   Right hand   toe reattachment  1996   Right 1st toe     Current Outpatient  Medications:    Ascorbic Acid (VITAMIN C) 1000 MG tablet, Take 1,000 mg by mouth daily., Disp: , Rfl:    Ferrous Sulfate (IRON PO), Take 1 tablet by mouth daily., Disp: , Rfl:    folic acid (FOLVITE) 1 MG tablet, Take 1 tablet by mouth daily., Disp: , Rfl:    lisinopril (ZESTRIL) 5 MG tablet, Take 1 tablet by mouth daily., Disp: , Rfl:    potassium chloride SA (KLOR-CON M) 20 MEQ tablet, Take by mouth., Disp: , Rfl:    thiamine (VITAMIN B1) 100 MG tablet, Take 1 tablet by mouth daily., Disp: , Rfl:    Family History  Problem Relation Age of Onset   Heart attack Father 37   Hypertension Father    Coronary artery disease Mother 85       Stent   Cancer Mother        Bone   Hypertension Mother    Hypertension Brother    Diabetes Neg Hx    Prostate cancer Neg Hx    Colon cancer Neg Hx    Heart disease Neg Hx      Social History   Tobacco Use   Smoking status: Never   Smokeless tobacco: Never  Vaping Use   Vaping Use: Former  Substance Use Topics   Alcohol use: Yes    Comment: Regular   Drug use: Yes    Types: Marijuana    Allergies as of 01/10/2022   (  No Known Allergies)    Review of Systems:    All systems reviewed and negative except where noted in HPI.   Physical Exam:  BP 125/80 (BP Location: Left Arm, Patient Position: Sitting, Cuff Size: Normal)   Pulse 74   Temp 99 F (37.2 C) (Oral)   Ht 5\' 11"  (1.803 m)   Wt 165 lb 6 oz (75 kg)   BMI 23.07 kg/m  No LMP for male patient.  General:   Alert,  Well-developed, well-nourished, pleasant and cooperative in NAD Head:  Normocephalic and atraumatic. Eyes:  Sclera clear, no icterus.   Conjunctiva pink. Ears:  Normal auditory acuity. Nose:  No deformity, discharge, or lesions. Mouth:  No deformity or lesions,oropharynx pink & moist. Neck:  Supple; no masses or thyromegaly. Lungs:  Respirations even and unlabored.  Clear throughout to auscultation.   No wheezes, crackles, or rhonchi. No acute distress. Heart:   Regular rate and rhythm; no murmurs, clicks, rubs, or gallops. Abdomen:  Normal bowel sounds. Soft, non-tender and non-distended without masses, hepatosplenomegaly or hernias noted.  No guarding or rebound tenderness.   Rectal: Not performed Msk:  Symmetrical without gross deformities. Good, equal movement & strength bilaterally. Pulses:  Normal pulses noted. Extremities:  No clubbing or edema.  No cyanosis. Neurologic:  Alert and oriented x3;  grossly normal neurologically. Skin:  Intact without significant lesions or rashes. No jaundice. Psych:  Alert and cooperative. Normal mood and affect.  Imaging Studies: No abdominal imaging  Assessment and Plan:   Levi Mejia is a 51 y.o. male with history of alcohol abuse, complicated by acute intracranial hemorrhage on 12/21/2021, seizure event, with no evidence of any focal neurologic deficit, attributed secondary to alcohol use is seen in consultation for chronically elevated LFTs and  Chronically elevated LFTs, AST greater than 2 times upper limit of ALT suggestive of alcoholic liver disease Recommend rest of the secondary liver disease work-up Recommend ultrasound liver Dopplers Patient has stopped drinking since 12/20/2021 and is committed to remain abstinent Patient would like to come back for blood work early next week Advised him to stop taking oral iron Continue multivitamin with thiamine and folic acid  Colon cancer screening, overdue Given recent episode of intracranial hemorrhage, will defer colonoscopy for next 3 to 6 months until cleared by neurology.  He has follow-up MRI scheduled in 3 months   Follow up in 3 months   12/22/2021, MD

## 2022-01-10 NOTE — Patient Instructions (Addendum)
Your Ultrasound liver doppler is scheduled for 01/19/2022 at 8:45am at out patient imaging. You need to arrive at 8:30am. Nothing to eat or drink after midnight. Address is [address] [place], [place] [address]. Phone number is [phone] Please come back for labs next week. Our lab is open on Mondays 1pm to 4pm, Tuesdays 8:30am to 4pm. Wednesdays 8:30am to 4pm and thursdays 1pm to 4pm.

## 2022-01-17 ENCOUNTER — Other Ambulatory Visit: Payer: Self-pay

## 2022-01-17 DIAGNOSIS — R748 Abnormal levels of other serum enzymes: Secondary | ICD-10-CM

## 2022-01-18 ENCOUNTER — Telehealth: Payer: Self-pay

## 2022-01-18 DIAGNOSIS — R748 Abnormal levels of other serum enzymes: Secondary | ICD-10-CM

## 2022-01-18 LAB — IRON,TIBC AND FERRITIN PANEL
Ferritin: 467 ng/mL — ABNORMAL HIGH (ref 30–400)
Iron Saturation: 44 % (ref 15–55)
Iron: 156 ug/dL (ref 38–169)
Total Iron Binding Capacity: 351 ug/dL (ref 250–450)
UIBC: 195 ug/dL (ref 111–343)

## 2022-01-18 LAB — HEPATITIS B SURFACE ANTIBODY,QUALITATIVE: Hep B Surface Ab, Qual: NONREACTIVE

## 2022-01-18 LAB — HEPATITIS C ANTIBODY: Hep C Virus Ab: NONREACTIVE

## 2022-01-18 LAB — ALPHA-1-ANTITRYPSIN: A-1 Antitrypsin: 152 mg/dL (ref 101–187)

## 2022-01-18 LAB — CERULOPLASMIN: Ceruloplasmin: 23.6 mg/dL (ref 16.0–31.0)

## 2022-01-18 LAB — ANA: Anti Nuclear Antibody (ANA): POSITIVE — AB

## 2022-01-18 LAB — HEPATITIS A ANTIBODY, TOTAL: hep A Total Ab: POSITIVE — AB

## 2022-01-18 LAB — ANTI-SMOOTH MUSCLE ANTIBODY, IGG: Smooth Muscle Ab: 23 Units — ABNORMAL HIGH (ref 0–19)

## 2022-01-18 LAB — MITOCHONDRIAL ANTIBODIES: Mitochondrial Ab: <20 Units (ref 0.0–20.0)

## 2022-01-18 LAB — HEPATITIS B CORE ANTIBODY, TOTAL: Hep B Core Total Ab: NEGATIVE

## 2022-01-18 NOTE — Telephone Encounter (Signed)
-----   Message from Toney Reil, MD sent at 01/18/2022  3:46 PM EST ----- Please inform patient that the blood work for other causes of liver disease came back showing that he may have autoimmune cause of his liver disease.  Therefore, recommend ultrasound-guided liver biopsy for further evaluation  Rohini Vanga

## 2022-01-18 NOTE — Telephone Encounter (Signed)
Patient verbalized understanding he is okay with getting the Liver biopsy done. Order the liver Biopsy and Faxed to Marylu Lund in special procedures. Let Marylu Lund know that I have faxed the orders

## 2022-01-19 ENCOUNTER — Ambulatory Visit
Admission: RE | Admit: 2022-01-19 | Discharge: 2022-01-19 | Disposition: A | Discharge status: Home / Self Care | Payer: PRIVATE HEALTH INSURANCE | Source: Ambulatory Visit | Attending: Gastroenterology | Admitting: Gastroenterology

## 2022-01-19 DIAGNOSIS — R748 Abnormal levels of other serum enzymes: Secondary | ICD-10-CM | POA: Diagnosis present

## 2022-01-24 NOTE — Telephone Encounter (Signed)
Special procedures got patient schedule for 01/28/2022 [place] to [facility] [place] at 10 for 11 scan.called patient and patient verbalized understanding of the time

## 2022-04-07 ENCOUNTER — Observation Stay: Payer: PRIVATE HEALTH INSURANCE

## 2022-04-07 ENCOUNTER — Other Ambulatory Visit: Payer: Self-pay

## 2022-04-07 ENCOUNTER — Emergency Department: Payer: PRIVATE HEALTH INSURANCE

## 2022-04-07 ENCOUNTER — Observation Stay (HOSPITAL_BASED_OUTPATIENT_CLINIC_OR_DEPARTMENT_OTHER)
Admit: 2022-04-07 | Discharge: 2022-04-07 | Disposition: A | Discharge status: Home / Self Care | Payer: PRIVATE HEALTH INSURANCE | Attending: Internal Medicine | Admitting: Internal Medicine

## 2022-04-07 ENCOUNTER — Observation Stay
Admission: EM | Admit: 2022-04-07 | Discharge: 2022-04-08 | Disposition: A | Discharge status: Home / Self Care | Payer: PRIVATE HEALTH INSURANCE | Attending: Osteopathic Medicine | Admitting: Osteopathic Medicine

## 2022-04-07 DIAGNOSIS — F1020 Alcohol dependence, uncomplicated: Secondary | ICD-10-CM | POA: Diagnosis present

## 2022-04-07 DIAGNOSIS — R945 Abnormal results of liver function studies: Secondary | ICD-10-CM | POA: Diagnosis not present

## 2022-04-07 DIAGNOSIS — W19XXXA Unspecified fall, initial encounter: Secondary | ICD-10-CM | POA: Diagnosis not present

## 2022-04-07 DIAGNOSIS — Z79899 Other long term (current) drug therapy: Secondary | ICD-10-CM | POA: Insufficient documentation

## 2022-04-07 DIAGNOSIS — I10 Essential (primary) hypertension: Secondary | ICD-10-CM | POA: Diagnosis not present

## 2022-04-07 DIAGNOSIS — I639 Cerebral infarction, unspecified: Secondary | ICD-10-CM | POA: Diagnosis not present

## 2022-04-07 DIAGNOSIS — R7989 Other specified abnormal findings of blood chemistry: Secondary | ICD-10-CM | POA: Diagnosis not present

## 2022-04-07 DIAGNOSIS — R29818 Other symptoms and signs involving the nervous system: Principal | ICD-10-CM | POA: Insufficient documentation

## 2022-04-07 DIAGNOSIS — R531 Weakness: Secondary | ICD-10-CM

## 2022-04-07 DIAGNOSIS — I6389 Other cerebral infarction: Secondary | ICD-10-CM | POA: Diagnosis not present

## 2022-04-07 DIAGNOSIS — Y92009 Unspecified place in unspecified non-institutional (private) residence as the place of occurrence of the external cause: Secondary | ICD-10-CM

## 2022-04-07 DIAGNOSIS — E876 Hypokalemia: Secondary | ICD-10-CM | POA: Diagnosis present

## 2022-04-07 DIAGNOSIS — E871 Hypo-osmolality and hyponatremia: Secondary | ICD-10-CM | POA: Diagnosis not present

## 2022-04-07 HISTORY — DX: Nontraumatic intracerebral hemorrhage, unspecified: I61.9

## 2022-04-07 HISTORY — DX: Alcohol abuse, uncomplicated: F10.10

## 2022-04-07 HISTORY — DX: Essential (primary) hypertension: I10

## 2022-04-07 LAB — HEMOGLOBIN A1C
Hgb A1c MFr Bld: 4.6 % — ABNORMAL LOW (ref 4.8–5.6)
Mean Plasma Glucose: 85.32 mg/dL

## 2022-04-07 LAB — ECHOCARDIOGRAM COMPLETE
AR max vel: 2.7 cm2
AV Area VTI: 2.21 cm2
AV Area mean vel: 2.47 cm2
AV Mean grad: 2 mmHg
AV Peak grad: 4.2 mmHg
Ao pk vel: 1.03 m/s
Area-P 1/2: 3.19 cm2
MV VTI: 2.07 cm2
S' Lateral: 2.6 cm
Weight: 2800.72 oz

## 2022-04-07 LAB — COMPREHENSIVE METABOLIC PANEL
ALT: 449 U/L — ABNORMAL HIGH (ref 0–44)
AST: 1376 U/L — ABNORMAL HIGH (ref 15–41)
Albumin: 4.8 g/dL (ref 3.5–5.0)
Alkaline Phosphatase: 58 U/L (ref 38–126)
Anion gap: 13 (ref 5–15)
BUN: 11 mg/dL (ref 6–20)
CO2: 30 mmol/L (ref 22–32)
Calcium: 8.7 mg/dL — ABNORMAL LOW (ref 8.9–10.3)
Chloride: 87 mmol/L — ABNORMAL LOW (ref 98–111)
Creatinine, Ser: 1.11 mg/dL (ref 0.61–1.24)
GFR, Estimated: >60 mL/min (ref >60)
Glucose, Bld: 93 mg/dL (ref 70–99)
Potassium: 3 mmol/L — ABNORMAL LOW (ref 3.5–5.1)
Sodium: 130 mmol/L — ABNORMAL LOW (ref 135–145)
Total Bilirubin: 2.8 mg/dL — ABNORMAL HIGH (ref 0.3–1.2)
Total Protein: 8.6 g/dL — ABNORMAL HIGH (ref 6.5–8.1)

## 2022-04-07 LAB — CBC
HCT: 47 % (ref 39.0–52.0)
Hemoglobin: 16.5 g/dL (ref 13.0–17.0)
MCH: 33.7 pg (ref 26.0–34.0)
MCHC: 35.1 g/dL (ref 30.0–36.0)
MCV: 95.9 fL (ref 80.0–100.0)
Platelets: 147 10*3/uL — ABNORMAL LOW (ref 150–400)
RBC: 4.9 MIL/uL (ref 4.22–5.81)
RDW: 11.4 % — ABNORMAL LOW (ref 11.5–15.5)
WBC: 10.1 10*3/uL (ref 4.0–10.5)
nRBC: 0 % (ref 0.0–0.2)

## 2022-04-07 LAB — APTT: aPTT: 28 seconds (ref 24–36)

## 2022-04-07 LAB — CBG MONITORING, ED: Glucose-Capillary: 89 mg/dL (ref 70–99)

## 2022-04-07 LAB — ETHANOL: Alcohol, Ethyl (B): <10 mg/dL (ref <10)

## 2022-04-07 LAB — MAGNESIUM: Magnesium: 1.3 mg/dL — ABNORMAL LOW (ref 1.7–2.4)

## 2022-04-07 LAB — PROTIME-INR
INR: 0.9 (ref 0.8–1.2)
Prothrombin Time: 12.5 seconds (ref 11.4–15.2)

## 2022-04-07 LAB — PHOSPHORUS: Phosphorus: 2.6 mg/dL (ref 2.5–4.6)

## 2022-04-07 LAB — AMMONIA: Ammonia: 19 umol/L (ref 9–35)

## 2022-04-07 MED ORDER — THIAMINE HCL 100 MG/ML IJ SOLN
100.0000 mg | Freq: Every day | INTRAMUSCULAR | Status: DC
  Filled 2022-04-07: qty 2

## 2022-04-07 MED ORDER — STROKE: EARLY STAGES OF RECOVERY BOOK: Freq: Once | Status: AC

## 2022-04-07 MED ORDER — POTASSIUM CHLORIDE CRYS ER 20 MEQ PO TBCR: 40.0000 meq | EXTENDED_RELEASE_TABLET | ORAL | Status: DC

## 2022-04-07 MED ORDER — LORAZEPAM 2 MG/ML IJ SOLN: 1.0000 mg | INTRAMUSCULAR | Status: DC | PRN

## 2022-04-07 MED ORDER — LISINOPRIL 5 MG PO TABS
5.0000 mg | ORAL_TABLET | Freq: Every day | ORAL | Status: DC
  Administered 2022-04-08: 5 mg via ORAL
  Filled 2022-04-07: qty 1

## 2022-04-07 MED ORDER — POTASSIUM CHLORIDE 20 MEQ PO PACK
40.0000 meq | PACK | ORAL | Status: AC
  Administered 2022-04-07 (×2): 40 meq via ORAL
  Filled 2022-04-07 (×2): qty 2

## 2022-04-07 MED ORDER — LORAZEPAM 1 MG PO TABS: 0.0000 mg | ORAL_TABLET | Freq: Two times a day (BID) | ORAL | Status: DC

## 2022-04-07 MED ORDER — SENNOSIDES-DOCUSATE SODIUM 8.6-50 MG PO TABS: 1.0000 | ORAL_TABLET | Freq: Every evening | ORAL | Status: DC | PRN

## 2022-04-07 MED ORDER — LORAZEPAM 1 MG PO TABS: 1.0000 mg | ORAL_TABLET | ORAL | Status: DC | PRN

## 2022-04-07 MED ORDER — ASPIRIN 325 MG PO TABS
325.0000 mg | ORAL_TABLET | Freq: Every day | ORAL | Status: DC
  Administered 2022-04-07 – 2022-04-08 (×2): 325 mg via ORAL
  Filled 2022-04-07 (×2): qty 1

## 2022-04-07 MED ORDER — SODIUM CHLORIDE 0.9 % IV SOLN: INTRAVENOUS | Status: DC

## 2022-04-07 MED ORDER — MAGNESIUM SULFATE IN D5W 1-5 GM/100ML-% IV SOLN
1.0000 g | Freq: Once | INTRAVENOUS | Status: AC
  Administered 2022-04-07: 1 g via INTRAVENOUS
  Filled 2022-04-07: qty 100

## 2022-04-07 MED ORDER — ONDANSETRON HCL 4 MG/2ML IJ SOLN: 4.0000 mg | Freq: Three times a day (TID) | INTRAMUSCULAR | Status: DC | PRN

## 2022-04-07 MED ORDER — THIAMINE MONONITRATE 100 MG PO TABS
100.0000 mg | ORAL_TABLET | Freq: Every day | ORAL | Status: DC
  Administered 2022-04-07 – 2022-04-08 (×2): 100 mg via ORAL
  Filled 2022-04-07 (×2): qty 1

## 2022-04-07 MED ORDER — MAGNESIUM SULFATE 50 % IJ SOLN: 3.0000 g | Freq: Once | INTRAVENOUS | Status: DC

## 2022-04-07 MED ORDER — ENOXAPARIN SODIUM 40 MG/0.4ML IJ SOSY
40.0000 mg | PREFILLED_SYRINGE | INTRAMUSCULAR | Status: DC
  Administered 2022-04-07: 40 mg via SUBCUTANEOUS
  Filled 2022-04-07: qty 0.4

## 2022-04-07 MED ORDER — FOLIC ACID 1 MG PO TABS
1.0000 mg | ORAL_TABLET | Freq: Every day | ORAL | Status: DC
  Administered 2022-04-07 – 2022-04-08 (×2): 1 mg via ORAL
  Filled 2022-04-07 (×2): qty 1

## 2022-04-07 MED ORDER — IBUPROFEN 400 MG PO TABS: 200.0000 mg | ORAL_TABLET | Freq: Four times a day (QID) | ORAL | Status: DC | PRN

## 2022-04-07 MED ORDER — HYDRALAZINE HCL 20 MG/ML IJ SOLN: 5.0000 mg | INTRAMUSCULAR | Status: DC | PRN

## 2022-04-07 MED ORDER — ASPIRIN 300 MG RE SUPP
300.0000 mg | Freq: Every day | RECTAL | Status: DC
  Filled 2022-04-07: qty 1

## 2022-04-07 MED ORDER — ADULT MULTIVITAMIN W/MINERALS CH
1.0000 | ORAL_TABLET | Freq: Every day | ORAL | Status: DC
  Administered 2022-04-07 – 2022-04-08 (×2): 1 via ORAL
  Filled 2022-04-07 (×2): qty 1

## 2022-04-07 MED ORDER — IOHEXOL 350 MG/ML SOLN
75.0000 mL | Freq: Once | INTRAVENOUS | Status: AC | PRN
  Administered 2022-04-07: 75 mL via INTRAVENOUS

## 2022-04-07 MED ORDER — ATORVASTATIN CALCIUM 20 MG PO TABS
40.0000 mg | ORAL_TABLET | Freq: Every day | ORAL | Status: DC
  Administered 2022-04-07: 40 mg via ORAL
  Filled 2022-04-07: qty 2

## 2022-04-07 MED ORDER — MAGNESIUM SULFATE 2 GM/50ML IV SOLN
2.0000 g | Freq: Once | INTRAVENOUS | Status: AC
  Administered 2022-04-07: 2 g via INTRAVENOUS
  Filled 2022-04-07: qty 50

## 2022-04-07 MED ORDER — LORAZEPAM 1 MG PO TABS: 0.0000 mg | ORAL_TABLET | Freq: Four times a day (QID) | ORAL | Status: DC

## 2022-04-07 NOTE — ED Notes (Signed)
Unable to start 1g mag due to roller clamp being on previous mag and it not infusing. Waiting for 2g mag to finish to start 1g mag.

## 2022-04-07 NOTE — Evaluation (Signed)
Physical Therapy Evaluation Patient Details Name: Levi Mejia MRN: [id number] DOB: 25-May-1970 Today's Date: 04/07/2022  History of Present Illness  52 y.o. male with medical history significant of alcohol abuse, hypertension, mini stroke per pt, ADHD, ICH, chronic abnormal liver function, seizure, ICH, who presents with left-sided weakness, right facial droop, dizziness, fall.     Patient states he has not been feeling well in the past 4 days.  He has dizziness, left-sided weakness and numbness, vomiting, unsteady sensation.  He states he fell on Sunday night, no loss of consciousness.  He has right facial droop.  No difficult swallowing. He went to see his PCP yesterday and was told to go to the ED for an MRI but decided not to at that time. This AM he went to work again and his boss noted that he appeared to be weak on his left side and should go to the ED. Patient states that he continues to drink alcohol.  Clinical Impression  Pt reports minimal residual symptoms but does have mild R facial droop and L LE minimally weaker than R. He was able to safety and confidently do bed mobility and ambulate w/o AD ~350 ft with community appropriate speed.  He does endorse continued issues with swallowing.  He is functional and very mobile but not currently back to baseline.  Pt is safe to return home from a PT perspective once cleared for d/c medically, will benefit from continued PT to continue working on mild residual weakness issues.     Recommendations for follow up therapy are one component of a multi-disciplinary discharge planning process, led by the attending physician.  Recommendations may be updated based on patient status, additional functional criteria and insurance authorization.  Follow Up Recommendations Outpatient PT (pt interested but per progress may not need)      Assistance Recommended at Discharge None  Patient can return home with the following       Equipment Recommendations  None recommended by PT  Recommendations for Other Services       Functional Status Assessment Patient has had a recent decline in their functional status and demonstrates the ability to make significant improvements in function in a reasonable and predictable amount of time.     Precautions / Restrictions Precautions Precautions: Fall Restrictions Weight Bearing Restrictions: No      Mobility  Bed Mobility Overal bed mobility: Independent                  Transfers Overall transfer level: Independent Equipment used: None               General transfer comment: easily rises to standing w/o hesitation or need for UEs    Ambulation/Gait Ambulation/Gait assistance: Independent Gait Distance (Feet): 350 Feet Assistive device: None         General Gait Details: Pt was able to maintain community appropriate speed with confidence and no LOBs or unsteadiness.  He met gait challenges of speed changes, stop/turns, head movements, high stepping, etc with good confidence and no overt safety issues.  Stairs            Wheelchair Mobility    Modified Rankin (Stroke Patients Only)       Balance Overall balance assessment: Independent  Pertinent Vitals/Pain Pain Assessment Pain Assessment: No/denies pain    Home Living Family/patient expects to be discharged to:: Private residence Living Arrangements: Spouse/significant other;Children Available Help at Discharge: Available PRN/intermittently   Home Access: Level entry     Alternate Level Stairs-Number of Steps: flight Home Layout: Two level Home Equipment: None      Prior Function Prior Level of Function : Independent/Modified Independent             Mobility Comments: Pt works as a Dealer, completely independent and relatively active ADLs Comments: independent     Hand Dominance        Extremity/Trunk Assessment   Upper  Extremity Assessment Upper Extremity Assessment: Overall WFL for tasks assessed (functional and symmetrical strength b/l)    Lower Extremity Assessment Lower Extremity Assessment: Overall WFL for tasks assessed (functional strength t/o, L LE minimally weaker than R with mild tremor with holding static SLR)       Communication   Communication: No difficulties  Cognition Arousal/Alertness: Awake/alert Behavior During Therapy: WFL for tasks assessed/performed Overall Cognitive Status: Within Functional Limits for tasks assessed                                          General Comments General comments (skin integrity, edema, etc.): Pt very functional with mobility, has mild L LE weakness.  Reports dizziness has greatly improved and had little to none with prolonged standing/gait/balance challenges.    Exercises     Assessment/Plan    PT Assessment Patient needs continued PT services  PT Problem List Decreased strength;Decreased coordination       PT Treatment Interventions DME instruction;Gait training;Stair training;Functional mobility training;Therapeutic activities;Therapeutic exercise;Balance training;Neuromuscular re-education;Patient/family education    PT Goals (Current goals can be found in the Care Plan section)  Acute Rehab PT Goals Patient Stated Goal: get back to work PT Goal Formulation: With patient Time For Goal Achievement: 04/20/22 Potential to Achieve Goals: Good    Frequency Min 2X/week     Co-evaluation               AM-PAC PT "6 Clicks" Mobility  Outcome Measure Help needed turning from your back to your side while in a flat bed without using bedrails?: None Help needed moving from lying on your back to sitting on the side of a flat bed without using bedrails?: None Help needed moving to and from a bed to a chair (including a wheelchair)?: None Help needed standing up from a chair using your arms (e.g., wheelchair or bedside  chair)?: None Help needed to walk in hospital room?: None Help needed climbing 3-5 steps with a railing? : None 6 Click Score: 24    End of Session Equipment Utilized During Treatment: Gait belt Activity Tolerance: Patient tolerated treatment well Patient left: in bed;with nursing/sitter in room Nurse Communication: Mobility status PT Visit Diagnosis: Dizziness and giddiness (R42);Other symptoms and signs involving the nervous system (R29.898)    Time: 1720-1739 PT Time Calculation (min) (ACUTE ONLY): 19 min   Charges:   PT Evaluation $PT Eval Low Complexity: 1 Low          Kreg Shropshire, DPT 04/07/2022, 6:12 PM

## 2022-04-07 NOTE — ED Provider Notes (Signed)
[facility] Provider Note    Event Date/Time   First MD Initiated Contact with Patient 04/07/22 1004     (approximate)   History   Dizziness   HPI  Levi Mejia is a 52 y.o. male who presents to the emergency today with primary concern for dizziness.  The patient however states that he was sent here from primary care doctor's office.  Went to PCPs office yesterday.  While there they did have concern for stroke based on some speech changes and facial weakness.  Was recommended to come to ER yesterday but did not come until today.  The patient says that his symptoms started 2 to 3 days ago.  He feels generalized weakness.  He did have some headache initially but denies any headache now.     Physical Exam   Triage Vital Signs: ED Triage Vitals [04/07/22 0930]  Enc Vitals Group     BP (!) 142/116     Pulse Rate (!) 107     Resp 18     Temp 97.7 F (36.5 C)     Temp Source Oral     SpO2 98 %     Weight      Height      Head Circumference      Peak Flow      Pain Score      Pain Loc      Pain Edu?      Excl. in [place]?     Most recent vital signs: Vitals:   04/07/22 0930  BP: (!) 142/116  Pulse: (!) 107  Resp: 18  Temp: 97.7 F (36.5 C)  SpO2: 98%   General: Awake, alert, oriented. CV:  Good peripheral perfusion. Regular rate and rhythm. Resp:  Normal effort. Lungs clear. Abd:  No distention.  Other:  Left facial weakness. Left upper and lower extremity weakness.   ED Results / Procedures / Treatments   Labs (all labs ordered are listed, but only abnormal results are displayed) Labs Reviewed  CBC - Abnormal; Notable for the following components:      Result Value   RDW 11.4 (*)    Platelets 147 (*)    All other components within normal limits  PROTIME-INR  APTT  COMPREHENSIVE METABOLIC PANEL  ETHANOL  PROTIME-INR  APTT  CBC  DIFFERENTIAL  COMPREHENSIVE METABOLIC PANEL  URINE DRUG SCREEN, QUALITATIVE ([facility] ONLY)   URINALYSIS, ROUTINE W REFLEX MICROSCOPIC  CBG MONITORING, ED     EKG  I, Levi Mejia, attending physician, personally viewed and interpreted this EKG  EKG Time: 1027 Rate: 81 Rhythm: normal sinus rhythm Axis: normal Intervals: qtc 462 QRS: narrow ST changes: no st elevation, t wave inversion v1 Impression: abnormal ekg    RADIOLOGY I independently interpreted and visualized the CT head. My interpretation: No bleed Radiology interpretation:  IMPRESSION:  1. Foci of mild hypodensity in the right corona radiata and right  basal ganglia region, new since prior CT, could represent small  infarcts, age indeterminate.  2. No large acute territorial infarct, hemorrhage or hydrocephalus.  3. Aspects is 9.   I, Levi Mejia, personally discussed these images and results by phone with the on-call radiologist and used this discussion as part of my medical decision making.     PROCEDURES:  Critical Care performed: No  Procedures   MEDICATIONS ORDERED IN ED: Medications  iohexol (OMNIPAQUE) 350 MG/ML injection 75 mL (75 mLs Intravenous Contrast Given 04/07/22 1003)  IMPRESSION / MDM / ASSESSMENT AND PLAN / ED COURSE  I reviewed the triage vital signs and the nursing notes.                              Differential diagnosis includes, but is not limited to, CVA, TIA, electrolyte abnormality  Patient's presentation is most consistent with acute presentation with potential threat to life or bodily function.  Patient presented to the emergency department today because of concerns for strokelike symptoms.  On exam patient feeling weakness.  CT scan of the head is concerning for age-indeterminate strokes.  Patient was called a code stroke and evaluated by Dr. Cheral Marker with neurology.  At this time patient is not a TNK candidate.  Discussed with Dr. Blaine Hamper with the hospitalist service who will plan on admission.     FINAL CLINICAL IMPRESSION(S) / ED DIAGNOSES   Final  diagnoses:  Left-sided weakness  Cerebrovascular accident (CVA), unspecified mechanism ([place])     Note:  This document was prepared using Dragon voice recognition software and may include unintentional dictation errors.    Levi [name], MD 04/07/22 [id number]

## 2022-04-07 NOTE — Progress Notes (Signed)
PHARMACIST CODE STROKE RESPONSE  No TNK. Pt out of window and hx of hemorrhage per patient.    Levi Mejia 04/07/22 4:31 PM

## 2022-04-07 NOTE — ED Notes (Signed)
Pt to 1h from CT

## 2022-04-07 NOTE — ED Notes (Signed)
Physician notified for appropriate diet order.

## 2022-04-07 NOTE — Consult Note (Addendum)
NEURO HOSPITALIST CONSULT NOTE   Requestig physician: Dr. Blaine Hamper  Reason for Consult: New onset of left sided weakness, paresthesia and tingling  History obtained from:  Patient and Chart     HPI:                                                                                                                                          Levi Mejia is an 52 y.o. male with a PMHx of stroke, ICH, seizures x 2 secondary to [place], HTN and ADD who presents from work with new onset of left sided weakness and numbness. Patient states that he first started feeling unwell on Saturday, with dizziness. He characterizes the dizziness at a combination of primarily vertigo and a wobbling/unsteady sensation with a small component of lightheadedness. He also felt "weak all over" in addition to a "drunk feeling" with regard to his dizziness and ability to ambulate. 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 On Tuesday the above symptoms got worse to the extent that he had to call in sick for work. On Wednesday he started to feel additional symptoms of left sided weakness, decreased finger dexterity and numbness to his arm an leg, as well as some left sided facial tingling. He did go to work but states that he essentially could not function normally all day. He also went to see his PCP and was told to go to the ED for an MRI but decided not to at that time. This AM he went to work again and his boss noted that he appeared to be weak on his left side and should go to the ED. The patient then drove himself to the ED where a Code Stroke was called. On arrival he also endorses having had lots of recent falls.   Past Medical History:  Diagnosis Date   Attention deficit disorder without mention of hyperactivity     Past Surgical History:  Procedure Laterality Date   Hand tendon reattachment  1992   Right hand   toe reattachment  1996    Right 1st toe    Family History  Problem Relation Age of Onset   Heart attack Father 16   Hypertension Father    Coronary artery disease Mother 38       Stent   Cancer Mother        Bone   Hypertension Mother    Hypertension Brother    Diabetes Neg Hx    Prostate cancer Neg Hx    Colon cancer Neg Hx    Heart disease Neg Hx              Social History:  reports that he has never  smoked. He has never used smokeless tobacco. He reports current alcohol use. He reports current drug use. Drug: Marijuana.  No Known Allergies  MEDICATIONS:                                                                                                                     No current facility-administered medications on file prior to encounter.   Current Outpatient Medications on File Prior to Encounter  Medication Sig Dispense Refill   lisinopril (ZESTRIL) 5 MG tablet Take 5 mg by mouth daily.     Ascorbic Acid (VITAMIN C) 1000 MG tablet Take 1,000 mg by mouth daily.     Ferrous Sulfate (IRON PO) Take 1 tablet by mouth daily.     folic acid (FOLVITE) 1 MG tablet Take 1 tablet by mouth daily.     potassium chloride SA (KLOR-CON M) 20 MEQ tablet Take by mouth.     thiamine (VITAMIN B1) 100 MG tablet Take 1 tablet by mouth daily.       Scheduled:  [START ON 04/08/2022]  stroke: early stages of recovery book   Does not apply Once   aspirin  300 mg Rectal Daily   Or   aspirin  325 mg Oral Daily   atorvastatin  40 mg Oral Daily   enoxaparin (LOVENOX) injection  40 mg Subcutaneous [id number]   folic acid  1 mg Oral Daily   [START ON 04/08/2022] lisinopril  5 mg Oral Daily   LORazepam  0-4 mg Oral Q6H   Followed by   Derrill Memo ON 04/09/2022] LORazepam  0-4 mg Oral Q12H   multivitamin with minerals  1 tablet Oral Daily   thiamine  100 mg Oral Daily   Or   thiamine  100 mg Intravenous Daily   Continuous:  sodium chloride 100 mL/hr at 04/07/22 1226   magnesium sulfate bolus IVPB 1 g (04/07/22 1844)     ROS:                                                                                                                                        Positive for nausea and feeling as though he is going to pass out. No chest pain. Other ROS as per HPI.    Blood pressure (!) 142/116, pulse (!) 107, temperature 97.7 F (36.5 C), temperature source Oral, resp. rate 18, SpO2 98 %.   General Examination:  Physical Exam  HEENT-  [place]/AT  Lungs- Respirations unlabored Extremities- No edema   Neurological Examination Mental Status: Awake and alert. Oriented x 5. Speech fluent with intact naming and comprehension. Able to follow all commands. No dysarthria. Anxious affect noted. No neglect.  Cranial Nerves: II: Temporal visual fields intact with no extinction to DSS. PERRL with mildly dilated pupils in ambient light (6 mm >> 3 mm).  III,IV, VI: EOMI. No nystagmus. No ptosis.  V: Decreased temp sensation to left side of face VII: Moves the right side of his perioral muscles less than left when talking but this is intermittent and appears to be due to a tic-like contraction on the right. When smiling and whistling his face is symmetric. Brow furrows equally bilaterally.  VIII: Hearing intact to voice IX,X: No hypophonia or hoarseness XI: Head is midline XII: Midline tongue extension Motor: RUE 5/5 RLE 5/5 LUE 4/5 with bobbing drift LLE 4+/5 proximally with bobbing drift; ADF 5/5 Sensory: Decreased temp sensation to LUE and LLE. FT intact x 4. No extinction to DSS.  Deep Tendon Reflexes: 2-3+ bilateral brachioradialis and patellar reflexes. 2+ bilateral achilles. Negative Hoffman's. Toes downgoing bilaterally.  Cerebellar: No ataxia with FNF and H-S bilaterally  Gait: Deferred   Lab Results: Basic Metabolic Panel: No results for input(s): "NA", "K", "CL", "CO2", "GLUCOSE", "BUN", "CREATININE", "CALCIUM",  "MG", "PHOS" in the last 168 hours.  CBC: Recent Labs  Lab 04/07/22 0932  WBC 10.1  HGB 16.5  HCT 47.0  MCV 95.9  PLT 147*    Cardiac Enzymes: No results for input(s): "CKTOTAL", "CKMB", "CKMBINDEX", "TROPONINI" in the last 168 hours.  Lipid Panel: No results for input(s): "CHOL", "TRIG", "HDL", "CHOLHDL", "VLDL", "LDLCALC" in the last 168 hours.  Imaging: CT HEAD CODE STROKE WO CONTRAST`  Result Date: 04/07/2022 CLINICAL DATA:  Code stroke.  Transient ischemic attack. EXAM: CT HEAD WITHOUT CONTRAST TECHNIQUE: Contiguous axial images were obtained from the base of the skull through the vertex without intravenous contrast. RADIATION DOSE REDUCTION: This exam was performed according to the departmental dose-optimization program which includes automated exposure control, adjustment of the mA and/or kV according to patient size and/or use of iterative reconstruction technique. COMPARISON:  Head CT February 23, 2018. FINDINGS: Brain: Foci of mild hypodensity are seen in the right corona radiata and right basal ganglia region, new since prior CT, could represent small infarcts, age indeterminate. Hypodensity in the right corona radiata at series 3, image 17 appears similar to prior CT. No large acute territorial infarct, hemorrhage, hydrocephalus or extra-axial collection. Vascular: Calcified plaques in the bilateral intracranial vertebral arteries and carotid siphons. Skull: Normal. Negative for fracture or focal lesion. Sinuses/Orbits: Right maxillary inflammatory disease, only partially visualized. The orbits are maintained. Other: None. ASPECTS [facility] Stroke Program Early CT Score) - Ganglionic level infarction (caudate, lentiform nuclei, internal capsule, insula, M1-M3 cortex): 6 - Supraganglionic infarction (M4-M6 cortex): 3 Total score (0-10 with 10 being normal): 9 IMPRESSION: 1. Foci of mild hypodensity in the right corona radiata and right basal ganglia region, new since prior CT, could  represent small infarcts, age indeterminate. 2. No large acute territorial infarct, hemorrhage or hydrocephalus. 3. Aspects is 9. These results were called by telephone at the time of interpretation on 04/07/2022 at 9:50 am to provider [facility] , who verbally acknowledged these results. Electronically Signed   By: Pedro Earls M.D.   On: 04/07/2022 09:50     Assessment: - Exam reveals left sided sensory and  motor deficits. Overall findings best localize to the right hemisphere, most likely secondary to a subacute lacunar infarction.  - CT head:  Foci of mild hypodensity in the right corona radiata and right basal ganglia region, new since prior CT, could represent small infarcts, age indeterminate. No large acute territorial infarct, hemorrhage or hydrocephalus. Aspects is 9.  - EKG: Normal sinus rhythm; Nonspecific ST abnormality - Current Labs:  - AST and ALT markedly elevated at 1376 and 449, respectively. The ratio suggests but does not confirm liver injury secondary to EtOH use.  - BUN and Cr are normal.  - Mild hyponatremia with Na of 130. Potassium also low at 3.  - Hypomagnesemia with Mg of 1.3.  - WBC normal. Platelets slightly below the bottom of the normal range at 147. No anemia.  - Coags are normal.  - EtOH < 10. This increases the likelihood of possible withdrawal syndrome given his anxiety and mildly dilated pupils on exam - Old Labs: - Had a low thiamine level of 7 in 2020. Will need to have a level redrawn - Had a postive ANA and smooth muscle Ab in November   Recommendations: - Agree with starting atorvastatin and ASA  - BP management per standard protocol. Out of the permissive HTN time window - HgbA1c, fasting lipid panel - MRI brain - Cardiac telemetry - TTE - CTA of head and neck - PT consult, OT consult, Speech consult - Risk factor modification - Frequent neuro checks - NPO until passes stroke swallow screen - CIWA protocol - Thiamine  level (ordered) followed by thiamine supplementation  Addendum: - CTA of head and neck: No LVO. 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 Atherosclerotic changes of the right vertebral artery with severe stenosis at the proximal V4 segment and severe stenosis versus occlusion near the vertebrobasilar junction. 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 Mild atherosclerotic changes of the carotid bifurcations without hemodynamically significant stenosis.  - TTE: LVEF 60 to 65%. Normal LV function with no regional wall motion abnormalities. There is mild left ventricular hypertrophy.  Left ventricular diastolic parameters are consistent with Grade I diastolic dysfunction (impaired relaxation). No valvular vegetation or mural thrombus mentioned in the report.  - MRI brain: Old lacunar infarcts in the right lentiform nucleus and bilateral periventricular white matter, greater on the right. Scattered foci of hemosiderin deposition, primarily within the deep gray nuclei, but with several in the cortex or subcortical white matter. 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  - Will need to start keeping a BP diary with daily readings at home - Benefits of continuation of ASA for stroke prevention outweigh risks in the context of the scattered foci of hemosiderin on MRI, which appear most likely to be secondary to chronic HTN, provided that he can keep his BP under good control. - Will need outpatient Neurology follow up.  - Neurohospitalist service will sign off. Please call if there are additional questions.      Electronically signed: Dr. Kerney Elbe 04/07/2022, 9:58 AM

## 2022-04-07 NOTE — ED Triage Notes (Signed)
Pt in from home/work; states saw PCP yesterday for stroke-like symptoms and was told to come into ER for MRI then but couldn't until today. States PCP concerned since hx of TIA and facial droop/weakness at that time and lots of recent falls. Pt steady while ambulating currently, speech clear currently, no facial droop noted currently.

## 2022-04-07 NOTE — ED Notes (Signed)
1h

## 2022-04-07 NOTE — Code Documentation (Signed)
Stroke Response Nurse Documentation Code Documentation  Levi Mejia is a 52 y.o. male arriving to [facility] via [employer] on 04/07/22 with a reported past medical hx of TIA, ICH, and HTN. On No antithrombotic. Code stroke was activated by ED.   Patient from home where he was LKW several days prior and now complaining of left sided weakness. Patient reports initially feeling dizzy, having blurry vision (left worse than right sided) and generally weak starting Saturday 2/3 with recent falls that has progressively worsened. Patient endorses there has never been an interval where he has felt back to his baseline since. Wednesday he started experiencing left sided weakness, numbness to his arm an leg, and left sided facial tingling. He reports seeing his PCP yesterday was told to go to the ED. This morning while at work, his boss noted that he appeared to be weak on his left side and should go to the ED. The patient then drove himself to the ED where a Code Stroke was called.    Stroke team meets patient in CT after code stroke alert.  Patient to CT with team. NIHSS 5, see documentation for details and code stroke times. Patient with left facial droop, left arm weakness, left leg weakness, left decreased sensation, and dysarthria  on exam. The following imaging was completed:  CT Head and CTA. Patient is not a candidate for IV Thrombolytic due to outside window, per MD. Patient is not a candidate for IR due to no LVO on imaging, per MD.   Care Plan: q2 NIHSS and vital signs, swallow screen per protocol.   Bedside handoff with ED RN Issac.    Charise Carwin  Stroke Response RN

## 2022-04-07 NOTE — ED Notes (Addendum)
Pt gone to MRI

## 2022-04-07 NOTE — ED Notes (Signed)
Called CT to notify pt will likely be code stroke. Staff states will take pt once a CT opens up but currently all in use.

## 2022-04-07 NOTE — Progress Notes (Signed)
*  PRELIMINARY RESULTS* Echocardiogram 2D Echocardiogram has been performed.  Levi Mejia 04/07/2022, 4:28 PM

## 2022-04-07 NOTE — Progress Notes (Signed)
PT Cancellation Note  Patient Details Name: Levi Mejia MRN: [id number] DOB: 1970/04/12   Cancelled Treatment:    Reason Eval/Treat Not Completed: Other (comment) PT orders received, chart reviewed. Attempted to see pt but echo in room. Will f/u as able.  Lavone Nian, PT, DPT 04/07/22, 4:13 PM    [name] 04/07/2022, 4:12 PM

## 2022-04-07 NOTE — ED Notes (Addendum)
Pt currently reporting dizziness, nausea, weakness and feeling like he is going to pass out.

## 2022-04-07 NOTE — ED Notes (Signed)
Meds to be given after echo

## 2022-04-07 NOTE — Progress Notes (Signed)
SLP Cancellation Note  Patient Details Name: Levi Mejia MRN: [id number] DOB: [birth year]   Cancelled treatment:       Reason Eval/Treat Not Completed: Patient at procedure or test/unavailable (chart reviewed; consulted NSG.)  NSG reported pt was verbal and followed all commands; slight Left oral weakness possibly but speech is 100% intelligible. Pt is on a Regular diet/ordered.  ST services will f/u w/ pt tomorrow.      Orinda Kenner, MS, CCC-SLP Speech Language Pathologist Rehab Services; [place] [phone] (ascom) Tjay Velazquez 04/07/2022, 2:10 PM

## 2022-04-07 NOTE — ED Notes (Signed)
First nurse note:  Pt here via POV ambulatory with steady gait, pt states PCP sent pt here for CT/MRI. Pt states s/s started 3-4 days ago, pt endorses dizziness which is causing him to fall. Pt speaking clearly at this time, no facial droop noted.Pt denies CVA but states he has "had a mini stroke before". Pt denies blood thinner use, pt being triaged at this time.

## 2022-04-07 NOTE — H&P (Signed)
History and Physical    Levi Mejia HCW:[id number] DOB: 09-11-1970 DOA: 04/07/2022  Referring MD/NP/PA:   PCP: Juanda Bond, NP   Patient coming from:  The patient is coming from home.     Chief Complaint: Left-sided weakness, right facial droop, dizziness, fall  HPI: Levi Mejia is a 52 y.o. male with medical history significant of alcohol abuse, hypertension, mini stroke per pt, ADHD, ICH, chronic abnormal liver function, seizure, ICH, who presents with left-sided weakness, right facial droop, dizziness, fall.  Patient states he has not been feeling well in the past 4 days.  He has dizziness, left-sided weakness and numbness, vomiting, unsteady sensation.  He states he fell on Sunday night, no loss of consciousness.  He has right facial droop.  No difficult swallowing. He went to see his PCP yesterday and was told to go to the ED for an MRI but decided not to at that time. This AM he went to work again and his boss noted that he appeared to be weak on his left side and should go to the ED. Patient states that he continues to drink alcohol, last drink of vodka was yesterday.  Data reviewed independently and ED Course: pt was found to have WBC 10.1, INR 0.9, PTT 28, alcohol level less than 10, potassium 3.0, sodium 130, GFR> 60, abnormal liver function with ALP 58, AST 1376, ALT 449, total bilirubin 2.8 (ALP 67, AST 209, ALT 165, total bilirubin 2.2 on 09/23/2021).  Temperature normal, blood pressure 135/91, heart rate 107, RR 17, oxygen saturation 98% on room air.  CTA of head and neck is negative for LVO.  CT-head: 1. Foci of mild hypodensity in the right corona radiata and right basal ganglia region, new since prior CT, could represent small infarcts, age indeterminate. 2. No large acute territorial infarct, hemorrhage or hydrocephalus. 3. Aspects is 9.   EKG: I have personally reviewed.  Sinus rhythm, QTc 462, low voltage in lead I, nonspecific T wave change   Review of  Systems:   General: no fevers, chills, no body weight gain, has poor appetite, has fatigue HEENT: no blurry vision, hearing changes or sore throat Respiratory: no dyspnea, coughing, wheezing CV: no chest pain, no palpitations GI: no nausea, vomiting, abdominal pain, diarrhea, constipation GU: no dysuria, burning on urination, increased urinary frequency, hematuria  Ext: no leg edema Neuro: no vision change or hearing loss. Has fall, right facial droop, left-sided weakness Skin: no rash, no skin tear. MSK: No muscle spasm, no deformity, no limitation of range of movement in spin Heme: No easy bruising.  Travel history: No recent long distant travel.   Allergy: No Known Allergies  Past Medical History:  Diagnosis Date   Alcohol abuse    Attention deficit disorder without mention of hyperactivity    HTN (hypertension)    ICH (intracerebral hemorrhage) (HCC)     Past Surgical History:  Procedure Laterality Date   Hand tendon reattachment  1992   Right hand   toe reattachment  1996   Right 1st toe    Social History:  reports that he has never smoked. He has never used smokeless tobacco. He reports current alcohol use. He reports current drug use. Drug: Marijuana.  Family History:  Family History  Problem Relation Age of Onset   Heart attack Father 71   Hypertension Father    Coronary artery disease Mother 47       Stent   Cancer Mother  Bone   Hypertension Mother    Hypertension Brother    Diabetes Neg Hx    Prostate cancer Neg Hx    Colon cancer Neg Hx    Heart disease Neg Hx      Prior to Admission medications   Medication Sig Start Date End Date Taking? Authorizing Provider  Ascorbic Acid (VITAMIN C) 1000 MG tablet Take 1,000 mg by mouth daily.    [provider]  Ferrous Sulfate (IRON PO) Take 1 tablet by mouth daily. 12/22/21   [provider]  folic acid (FOLVITE) 1 MG tablet Take 1 tablet by mouth daily. 12/22/21 12/22/22  [provider]  lisinopril (ZESTRIL) 5 MG tablet Take 1 tablet by mouth daily. 08/25/21   [provider]  potassium chloride SA (KLOR-CON M) 20 MEQ tablet Take by mouth. 12/21/21 12/21/22  [provider]  thiamine (VITAMIN B1) 100 MG tablet Take 1 tablet by mouth daily. 12/21/21 12/21/22  [provider]    Physical Exam: Vitals:   04/07/22 1235 04/07/22 1300 04/07/22 1430 04/07/22 1530  BP: (!) 143/110 (!) 129/94 127/84 123/86  Pulse: 92 83 76 82  Resp:  15 (!) 21 17  Temp:      TempSrc:      SpO2:  97% 98% 96%  Weight:       General: Not in acute distress HEENT:       Eyes: PERRL, EOMI, no scleral icterus.       ENT: No discharge from the ears and nose, no pharynx injection, no tonsillar enlargement.        Neck: No JVD, no bruit, no mass felt. Heme: No neck lymph node enlargement. Cardiac: S1/S2, RRR, No murmurs, No gallops or rubs. Respiratory: No rales, wheezing, rhonchi or rubs. GI: Soft, nondistended, nontender, no rebound pain, no organomegaly, BS present. GU: No hematuria Ext: No pitting leg edema bilaterally. 1+DP/PT pulse bilaterally. Musculoskeletal: No joint deformities, No joint redness or warmth, no limitation of ROM in spin. Skin: No rashes.  Neuro: Alert, oriented X3, cranial nerves II-XII grossly intact except for right facial droop, moves all extremities normally. Muscle strength 5/5 in all extremities, sensation to light touch intact.  Psych: Patient is not psychotic, no suicidal or hemocidal ideation.  Labs on Admission: I have personally reviewed following labs and imaging studies  CBC: Recent Labs  Lab 04/07/22 0932  WBC 10.1  HGB 16.5  HCT 47.0  MCV 95.9  PLT 147*   Basic Metabolic Panel: Recent Labs  Lab 04/07/22 0932  NA 130*  K 3.0*  CL 87*  CO2 30  GLUCOSE 93  BUN 11  CREATININE 1.11  CALCIUM 8.7*  MG 1.3*  PHOS 2.6   GFR: Estimated Creatinine Clearance: 83.9 mL/min (by C-G formula based on SCr of  1.11 mg/dL). Liver Function Tests: Recent Labs  Lab 04/07/22 0932  AST 1,376*  ALT 449*  ALKPHOS 58  BILITOT 2.8*  PROT 8.6*  ALBUMIN 4.8   No results for input(s): "LIPASE", "AMYLASE" in the last 168 hours. No results for input(s): "AMMONIA" in the last 168 hours. Coagulation Profile: Recent Labs  Lab 04/07/22 0932  INR 0.9   Cardiac Enzymes: No results for input(s): "CKTOTAL", "CKMB", "CKMBINDEX", "TROPONINI" in the last 168 hours. BNP (last 3 results) No results for input(s): "PROBNP" in the last 8760 hours. HbA1C: No results for input(s): "HGBA1C" in the last 72 hours. CBG: Recent Labs  Lab 04/07/22 0923  GLUCAP 89   Lipid  Profile: No results for input(s): "CHOL", "HDL", "LDLCALC", "TRIG", "CHOLHDL", "LDLDIRECT" in the last 72 hours. Thyroid Function Tests: No results for input(s): "TSH", "T4TOTAL", "FREET4", "T3FREE", "THYROIDAB" in the last 72 hours. Anemia Panel: No results for input(s): "VITAMINB12", "FOLATE", "FERRITIN", "TIBC", "IRON", "RETICCTPCT" in the last 72 hours. Urine analysis:    Component Value Date/Time   COLORURINE STRAW (A) 02/23/2018 0135   APPEARANCEUR CLEAR (A) 02/23/2018 0135   APPEARANCEUR Clear 03/25/2014 1100   LABSPEC 1.005 02/23/2018 0135   LABSPEC >1.060 03/25/2014 1100   PHURINE 6.0 02/23/2018 0135   GLUCOSEU NEGATIVE 02/23/2018 0135   GLUCOSEU Negative 03/25/2014 1100   HGBUR NEGATIVE 02/23/2018 0135   HGBUR negative 07/17/2008 0956   BILIRUBINUR NEGATIVE 02/23/2018 0135   BILIRUBINUR Negative 03/25/2014 1100   KETONESUR 5 (A) 02/23/2018 0135   PROTEINUR NEGATIVE 02/23/2018 0135   UROBILINOGEN 0.2 07/17/2008 0956   NITRITE NEGATIVE 02/23/2018 0135   LEUKOCYTESUR NEGATIVE 02/23/2018 0135   LEUKOCYTESUR Negative 03/25/2014 1100   Sepsis Labs: @LABRCNTIP (procalcitonin:4,lacticidven:4) )No results found for this or any previous visit (from the past 240 hour(s)).   Radiological Exams on Admission: MR BRAIN WO  CONTRAST  Result Date: 04/07/2022 CLINICAL DATA:  Neuro deficit, acute, stroke suspected.  Dizziness. EXAM: MRI HEAD WITHOUT CONTRAST TECHNIQUE: Multiplanar, multiecho pulse sequences of the brain and surrounding structures were obtained without intravenous contrast. COMPARISON:  Head CT 04/07/2022 and older.  CTA head/neck 04/07/2022. FINDINGS: Brain: No acute infarct or hemorrhage. Old lacunar infarcts in the right lentiform nucleus and bilateral periventricular white matter, greater on the right. Scattered foci of hemosiderin deposition, primarily within the deep gray nuclei, but with several in the cortex or subcortical white matter. Sulcal hemosiderin deposition in the medial right parietal lobe. No mass or midline shift. No hydrocephalus or extra-axial collection. Basilar cisterns are patent. Vascular: Normal flow voids. Skull and upper cervical spine: Normal marrow signal. Sinuses/Orbits: Severe mucosal disease in the right maxillary sinus and right anterior ethmoid air cells. Other: None. IMPRESSION: 1. No acute intracranial process. 2. Old lacunar infarcts in the right lentiform nucleus and bilateral periventricular white matter, greater on the right. 3. Scattered foci of hemosiderin deposition, primarily within the deep gray nuclei, but with several in the cortex or subcortical white matter. 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 4. Severe mucosal disease in the right maxillary sinus and right anterior ethmoid air cells. Electronically Signed   By: 06/06/2022 M.D.   On: 04/07/2022 14:30   06/06/2022 Abdomen Limited RUQ (LIVER/GB)  Result Date: 04/07/2022 CLINICAL DATA:  Abnormal LFTs EXAM: ULTRASOUND ABDOMEN LIMITED RIGHT UPPER QUADRANT COMPARISON:  None Available. FINDINGS: Gallbladder: No gallstones or wall thickening visualized. No sonographic Murphy sign noted by sonographer. Common bile duct: Diameter: 2.3 mm Liver: No focal lesion identified. Increased parenchymal echogenicity.  Portal vein is patent on color Doppler imaging with normal direction of blood flow towards the liver. Other: None. IMPRESSION: 1. Normal sonographic appearance of the gallbladder. 2. Hepatic steatosis. Electronically Signed   By: 06/06/2022 M.D.   On: 04/07/2022 14:12   CT ANGIO HEAD NECK W WO CM  Result Date: 04/07/2022 CLINICAL DATA:  Neuro deficit, acute, stroke suspected. EXAM: CT ANGIOGRAPHY HEAD AND NECK TECHNIQUE: Multidetector CT imaging of the head and neck was performed using the standard protocol during bolus administration of intravenous contrast. Multiplanar CT image reconstructions and MIPs were obtained to evaluate the vascular anatomy. Carotid stenosis measurements (when applicable) are obtained utilizing NASCET criteria, using the distal internal  carotid diameter as the denominator. RADIATION DOSE REDUCTION: This exam was performed according to the departmental dose-optimization program which includes automated exposure control, adjustment of the mA and/or kV according to patient size and/or use of iterative reconstruction technique. CONTRAST:  64mL OMNIPAQUE IOHEXOL 350 MG/ML SOLN COMPARISON:  Head CT performed April 07, 2022. FINDINGS: CTA NECK FINDINGS Aortic arch: Common origin of the innominate and left common carotid artery from the aortic arch. Imaged portion shows no evidence of aneurysm or dissection. No significant stenosis of the major arch vessel origins. Right carotid system: Calcified atherosclerotic changes of the right carotid bifurcation without hemodynamically significant stenosis. Left carotid system: Mild atherosclerotic changes of left carotid bifurcation without hemodynamically significant stenosis. Vertebral arteries: Dominant left vertebral artery has normal caliber throughout its cervical course. Hypoplastic right vertebral artery has normal caliber throughout its cervical course. Skeleton: Negative. Other neck: Complete opacification of the right maxillary sinus  which has thickened walls, suggesting chronic disease. Upper chest: Negative. Review of the MIP images confirms the above findings CTA HEAD FINDINGS Anterior circulation: No significant stenosis, proximal occlusion, aneurysm, or vascular malformation. Posterior circulation: Calcified atherosclerotic changes at the proximal V4 segment of the right vertebral artery with severe focal stenosis. There is also decrease caliber distal to the right PICA takeoff with severe stenosis versus occlusion near the vertebrobasilar junction. Mild atherosclerotic plaques in the intracranial left vertebral artery without significant stenosis. Small caliber of the basilar artery, particularly at its distal aspect with mild luminal irregularity suggestive of a combination of atherosclerotic disease and hypoplastic vertebrobasilar system in the setting of bilateral fetal PCAs. There is no hemodynamically significant of the basilar artery. Atherosclerotic changes are noted along the right posterior cerebral artery with severe stenosis at the P1-P2 junction. The left PCA is maintained. Venous sinuses: As permitted by contrast timing, patent. Anatomic variants: Aplastic versus severely hypoplastic right A1/ACA. Bilateral fetal PCA. Review of the MIP images confirms the above findings IMPRESSION: 1. No intracranial large vessel occlusion. 2. 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 3. Atherosclerotic changes of the right vertebral artery with severe stenosis at the proximal V4 segment and severe stenosis versus occlusion near the vertebrobasilar junction. 4. 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 5. Mild atherosclerotic changes of the carotid bifurcations without hemodynamically  significant stenosis. 6. Complete opacification of the right maxillary sinus which has thickened walls, suggesting chronic disease. Electronically Signed   By: Baldemar Lenis M.D.   On: 04/07/2022 10:43   CT HEAD CODE STROKE WO CONTRAST`  Result Date: 04/07/2022 CLINICAL DATA:  Code stroke.  Transient ischemic attack. EXAM: CT HEAD WITHOUT CONTRAST TECHNIQUE: Contiguous axial images were obtained from the base of the skull through the vertex without intravenous contrast. RADIATION DOSE REDUCTION: This exam was performed according to the departmental dose-optimization program which includes automated exposure control, adjustment of the mA and/or kV according to patient size and/or use of iterative reconstruction technique. COMPARISON:  Head CT February 23, 2018. FINDINGS: Brain: Foci of mild hypodensity are seen in the right corona radiata and right basal ganglia region, new since prior CT, could represent small infarcts, age indeterminate. Hypodensity in the right corona radiata at series 3, image 17 appears similar to prior CT. No large acute territorial infarct, hemorrhage, hydrocephalus or extra-axial collection. Vascular:  Calcified plaques in the bilateral intracranial vertebral arteries and carotid siphons. Skull: Normal. Negative for fracture or focal lesion. Sinuses/Orbits: Right maxillary inflammatory disease, only partially visualized. The orbits are maintained. Other: None. ASPECTS [facility] Stroke Program Early CT Score) - Ganglionic level infarction (caudate, lentiform nuclei, internal capsule, insula, M1-M3 cortex): 6 - Supraganglionic infarction (M4-M6 cortex): 3 Total score (0-10 with 10 being normal): 9 IMPRESSION: 1. Foci of mild hypodensity in the right corona radiata and right basal ganglia region, new since prior CT, could represent small infarcts, age indeterminate. 2. No large acute territorial infarct, hemorrhage or hydrocephalus. 3. Aspects is 9. These results were called by  telephone at the time of interpretation on 04/07/2022 at 9:50 am to provider [facility] , who verbally acknowledged these results. Electronically Signed   By: Pedro Earls M.D.   On: 04/07/2022 09:50      Assessment/Plan Principal Problem:   Stroke [facility]) Active Problems:   Fall at home, initial encounter   Essential hypertension, benign   Alcohol dependence ([place])   Hypokalemia   Hypomagnesemia   Hyponatremia   Abnormal LFTs   Assessment and Plan:  Possible stroke [facility]): pt states that he has hx of mini stroke in the past.  Now patient presents with right facial droop, left-sided weakness, dizziness and fall.  CT of head showed possible age-indeterminate stroke. CTA negative for LVO.  Consulted Dr. Cheral Marker of neurology.  - Placed on tele bed for observation - Obtain MRI-brain  - Patient is out of window for permissive hypertension - ASA  (pt has hx of ICH, but CT-head today showed no hemorrhage) - Statin: Lipitor 40 mg daily - fasting lipid panel and HbA1c  - 2D transthoracic echocardiography  - swallowing screen. If fails, will get SLP - Check UDS  - PT/OT consult - f/u ammonia level  Fall at home, initial encounter: May be related to alcohol abuse -fall precaution -PT/OT  Essential hypertension, benign -IV hydralazine as needed -Lisinopril  Alcohol dependence ([place]) -CIWA protocol  Hypokalemia and hypomagnesemia: Potassium 3.0, magnesium 1.3.  Phosphorus level 2.6 -Repleted potassium and magnesium  Hyponatremia: Sodium 130 -IV fluid: 500 cc normal saline, 900 cc/h  Abnormal LFTs: Patient has a chronic abnormal liver function.  Patient has been following up with GI, last seen by Dr. Marius Ditch 01/10/2022.  Patient had duplex ultrasound of her liver on 01/19/2022, which is unremarkable. Liver biopsy was ordered by Dr. Aggie Moats on 01/18/22, but seems be not done. Today his liver function is worse, most likely due to alcohol abuse.  When Dr. Marius Ditch saw patient  in clinic, patient stopped drinking alcohol at that time, but obviously he restarted drinking again.  The ratio of AST/ALT is 1376/449, consistent with alcohol abuse.  -Avoid using Tylenol -Follow-up with upper quadrant ultrasound --> showed hepatic steatosis. -Follow-up with GI -check ammonia level     DVT ppx: SQ Lovenox  Code Status: Full code  Family Communication: not done, no family member is at bed side.      Disposition Plan:  Anticipate discharge back to previous environment  Consults called:  Dr. Cheral Marker of neuro  Admission status and Level of care: Telemetry Medical: for obs    Dispo: The patient is from: Home              Anticipated d/c is to: Home              Anticipated d/c date is: 1 day  Patient currently is not medically stable to d/c.    Severity of Illness:  The appropriate patient status for this patient is OBSERVATION. Observation status is judged to be reasonable and necessary in order to provide the required intensity of service to ensure the patient's safety. The patient's presenting symptoms, physical exam findings, and initial radiographic and laboratory data in the context of their medical condition is felt to place them at decreased risk for further clinical deterioration. Furthermore, it is anticipated that the patient will be medically stable for discharge from the hospital within 2 midnights of admission.        Date of Service 04/07/2022    Ivor Costa [facility]   If 7PM-7AM, please contact night-coverage [email or web address] 04/07/2022, 3:40 PM

## 2022-04-08 DIAGNOSIS — W19XXXA Unspecified fall, initial encounter: Secondary | ICD-10-CM | POA: Diagnosis not present

## 2022-04-08 DIAGNOSIS — I10 Essential (primary) hypertension: Secondary | ICD-10-CM | POA: Diagnosis not present

## 2022-04-08 DIAGNOSIS — E876 Hypokalemia: Secondary | ICD-10-CM

## 2022-04-08 DIAGNOSIS — F10288 Alcohol dependence with other alcohol-induced disorder: Secondary | ICD-10-CM | POA: Diagnosis not present

## 2022-04-08 DIAGNOSIS — I639 Cerebral infarction, unspecified: Secondary | ICD-10-CM | POA: Diagnosis not present

## 2022-04-08 LAB — COMPREHENSIVE METABOLIC PANEL
ALT: 231 U/L — ABNORMAL HIGH (ref 0–44)
AST: 345 U/L — ABNORMAL HIGH (ref 15–41)
Albumin: 3.8 g/dL (ref 3.5–5.0)
Alkaline Phosphatase: 48 U/L (ref 38–126)
Anion gap: 12 (ref 5–15)
BUN: 13 mg/dL (ref 6–20)
CO2: 24 mmol/L (ref 22–32)
Calcium: 8 mg/dL — ABNORMAL LOW (ref 8.9–10.3)
Chloride: 97 mmol/L — ABNORMAL LOW (ref 98–111)
Creatinine, Ser: 0.93 mg/dL (ref 0.61–1.24)
GFR, Estimated: >60 mL/min (ref >60)
Glucose, Bld: 77 mg/dL (ref 70–99)
Potassium: 3.1 mmol/L — ABNORMAL LOW (ref 3.5–5.1)
Sodium: 133 mmol/L — ABNORMAL LOW (ref 135–145)
Total Bilirubin: 2.7 mg/dL — ABNORMAL HIGH (ref 0.3–1.2)
Total Protein: 7 g/dL (ref 6.5–8.1)

## 2022-04-08 LAB — LIPID PANEL
Cholesterol: 212 mg/dL — ABNORMAL HIGH (ref 0–200)
HDL: 66 mg/dL (ref >40)
LDL Cholesterol: 123 mg/dL — ABNORMAL HIGH (ref 0–99)
Total CHOL/HDL Ratio: 3.2 RATIO
Triglycerides: 115 mg/dL (ref <150)
VLDL: 23 mg/dL (ref 0–40)

## 2022-04-08 LAB — HIV ANTIBODY (ROUTINE TESTING W REFLEX): HIV Screen 4th Generation wRfx: NONREACTIVE

## 2022-04-08 MED ORDER — ADULT MULTIVITAMIN W/MINERALS CH: 1.0000 | ORAL_TABLET | Freq: Every day | ORAL | Status: DC

## 2022-04-08 MED ORDER — IBUPROFEN 200 MG PO TABS: 400.0000 mg | ORAL_TABLET | Freq: Four times a day (QID) | ORAL | 0 refills | Status: DC | PRN

## 2022-04-08 MED ORDER — ASPIRIN 81 MG PO TBEC: 81.0000 mg | DELAYED_RELEASE_TABLET | Freq: Every day | ORAL | 0 refills | Status: DC

## 2022-04-08 NOTE — TOC Transition Note (Signed)
Transition of Care [facility]) - CM/SW Discharge Note   Patient Details  Name: Levi Mejia MRN: [id number] Date of Birth: [birth year]  Transition of Care [facility]) CM/SW Contact:  Gerilyn Pilgrim, LCSW Phone Number: 04/08/2022, 11:55 AM   Clinical Narrative:   Pt actively has discharge instructions in. Substance use resources added to AVS. CSW signing off.           Patient Goals and CMS Choice      Discharge Placement                         Discharge Plan and Services Additional resources added to the After Visit Summary for                                       Social Determinants of Health (SDOH) Interventions SDOH Screenings   Food Insecurity: No Food Insecurity (04/07/2022)  Housing: Low Risk  (04/07/2022)  Transportation Needs: No Transportation Needs (04/07/2022)  Utilities: Not At Risk (04/07/2022)  Depression (PHQ2-9): Low Risk  (11/07/2018)  Tobacco Use: Low Risk  (04/07/2022)     Readmission Risk Interventions     No data to display

## 2022-04-08 NOTE — Progress Notes (Signed)
Physical Therapy Treatment Patient Details Name: Levi Mejia MRN: [id number] DOB: [birth year] Today's Date: 04/08/2022   History of Present Illness Pt is a 52 year old male presenting with left-sided weakness and numbness, vomiting, unsteady sensation; PMH significant for alcohol abuse, hypertension, mini stroke per pt, ADHD, ICH, chronic abnormal liver function, seizure, ICH; MRI shows: No acute infarct or hemorrhage. Old lacunar infarcts in the  right lentiform nucleus and bilateral periventricular white matter,  greater on the right    PT Comments    Pt seen for PT tx with pt agreeable. Pt completes bed mobility & transfers from EOB & toilet independently. Pt is independent with ambulation without AD with head turns & stop/starts, and engages in retrograde gait without LOB. Pt negotiates flight of stairs with mod I<>independence. Pt declines floor transfer, noting he feels confident he can complete. Pt declines OPPT at this time & at this time does not demonstrate need for it. Pt has met all goals, d/c PT from acute PT caseload.    Recommendations for follow up therapy are one component of a multi-disciplinary discharge planning process, led by the attending physician.  Recommendations may be updated based on patient status, additional functional criteria and insurance authorization.  Follow Up Recommendations  No PT follow up     Assistance Recommended at Discharge None  Patient can return home with the following     Equipment Recommendations  None recommended by PT    Recommendations for Other Services       Precautions / Restrictions Precautions Precautions: Fall Restrictions Weight Bearing Restrictions: No     Mobility  Bed Mobility Overal bed mobility: Independent                  Transfers Overall transfer level: Independent                      Ambulation/Gait Ambulation/Gait assistance: Independent Gait Distance (Feet):  (>400 ft) Assistive  device: None Gait Pattern/deviations: WFL(Within Functional Limits) Gait velocity: increased         Stairs Stairs: Yes Stairs assistance: Independent, Modified independent (Device/Increase time) Stair Management: One rail Left Number of Stairs: 12 General stair comments: Independently ascends stairs without rails, mod I to hold to L rail when descending.   Wheelchair Mobility    Modified Rankin (Stroke Patients Only)       Balance Overall balance assessment: Independent Sitting-balance support: Feet supported Sitting balance-[name]: Normal     Standing balance support: No upper extremity supported, During functional activity Standing balance-[name]: Normal                              Cognition Arousal/Alertness: Awake/alert Behavior During Therapy: WFL for tasks assessed/performed Overall Cognitive Status: Within Functional Limits for tasks assessed                                          Exercises      General Comments General comments (skin integrity, edema, etc.): Pt engaged in stop/starts, head turns with gait, retrograde gait without LOB.      Pertinent Vitals/Pain Pain Assessment Pain Assessment: No/denies pain    Home Living Family/patient expects to be discharged to:: Private residence Living Arrangements: Spouse/significant other;Children Available Help at Discharge: Available PRN/intermittently   Home Access: Level entry  Alternate Level Stairs-Number of Steps: flight Home Layout: Two level Home Equipment: None      Prior Function            PT Goals (current goals can now be found in the care plan section) Acute Rehab PT Goals Patient Stated Goal: get back to work PT Goal Formulation: With patient Time For Goal Achievement: 04/20/22 Potential to Achieve Goals: Good Progress towards PT goals: Goals met/education completed, patient discharged from PT    Frequency           PT  Plan Discharge plan needs to be updated    Co-evaluation              AM-PAC PT "6 Clicks" Mobility   Outcome Measure  Help needed turning from your back to your side while in a flat bed without using bedrails?: None Help needed moving from lying on your back to sitting on the side of a flat bed without using bedrails?: None Help needed moving to and from a bed to a chair (including a wheelchair)?: None Help needed standing up from a chair using your arms (e.g., wheelchair or bedside chair)?: None Help needed to walk in hospital room?: None Help needed climbing 3-5 steps with a railing? : None 6 Click Score: 24    End of Session   Activity Tolerance: Patient tolerated treatment well Patient left:  (ambulating in room)         Time: [id number] PT Time Calculation (min) (ACUTE ONLY): 8 min  Charges:  $Therapeutic Activity: 8-22 mins                     Lavone Nian, PT, DPT 04/08/22, 12:08 PM   [name] 04/08/2022, 12:06 PM

## 2022-04-08 NOTE — Discharge Instructions (Signed)
                  Intensive Outpatient Programs  [facility]    [facility] [address]     [address] [place],  [place]     [place], [place] [phone]      [phone]  [facility]   [facility]  (Inpatient and outpatient)  [phone] (Suboxone and Methadone) [address]           [phone]           [facility]    [facility] - Intensive Outpatient [address]     [address] [place], [place] [address]     [place], [place]  [phone]      [phone]  [facility] (Outpatient, Inpatient, [facility]  [facility] (Groups and Residental) (insurance only) [phone]    [place], [place]          [phone]       [facility]    [facility] (for caregivers and family) [address]     [address] [place], [place]     [place], [place] [phone]      [phone]  Residential Treatment Programs  [facility]  [facility](2 years) Residential: 90 days)  [facility] [address]      [address] [place], [place]     [place], [place] [phone]      [phone] or [phone]  [facility]    [facility] [address]      [address] [place], [place]     [place], [place] [phone]      [phone]  [facility]   [facility] ([facility] [address]     [address] [place], [place] [address]     [place],  [phone]      [phone] Admissions: 8am-3pm M-F  [facility]: Residential Program (90 Days)              [facility]: [facility]  [place],      [place],   [phone] or [phone]    (Walk in Hours over the weekend or by referral)   [facility] Crisis: [facility]:[phone] (for crisis  response 24 hours a day) 

## 2022-04-08 NOTE — Discharge Summary (Signed)
Physician Discharge Summary   Patient: Levi Mejia MRN: [id number]  DOB: 11-03-1970   Admit:     Date of Admission: 04/07/2022 Admitted from: home   Discharge: Date of discharge: 04/08/22 Disposition: Home Condition at discharge: good  CODE STATUS: FULL CODE     Discharge Physician: Emeterio Reeve, DO [facility]     PCP: Juanda Bond, NP  Recommendations for Outpatient Follow-up:  Follow up with PCP Juanda Bond, NP in 2-4 weeks Please obtain labs/tests: CMP, Lipids in 2-4 weeks - if hepatic function is improved would start statin  Follow up w/ neurology in 6 weeks Follow up w/ GI as directed - recommend liver biopsy  Please follow up on the following pending results: none PCP AND OTHER OUTPATIENT PROVIDERS: SEE BELOW FOR SPECIFIC DISCHARGE INSTRUCTIONS PRINTED FOR PATIENT IN ADDITION TO GENERIC AVS PATIENT INFO    Discharge Instructions     Diet - low sodium heart healthy   Complete by: As directed    Increase activity slowly   Complete by: As directed          Discharge Diagnoses: Principal Problem:   Stroke [facility]) Active Problems:   Fall at home, initial encounter   Essential hypertension, benign   Alcohol dependence ([place])   Hypokalemia   Hypomagnesemia   Hyponatremia   Abnormal LFTs       Hospital Course: Levi Mejia is a 52 y.o. male with medical history significant of alcohol abuse, hypertension, mini stroke per pt, ADHD, ICH, chronic abnormal liver function, seizure, ICH, who presents to ED from home on 04/07/2022 with left-sided weakness, right facial droop, dizziness, fall. He went to see his PCP 02/07 and was told to go to the ED for an MRI but decided not to.  02/08: in ED - VSS, Low sodium 130, low K 3.0, elevated AS/ALT, no concerns on CBC.  CT head, CTA H/N, MRI brain as below - Neurology recommends continue ASA (vs DAPT given microhemorrhages). Unable to continue statin d/t liver enzymes, follow outpatient.    Consultants:  neurology  Procedures: none   Pertinent imaging / tests:   CT Head IMPRESSION: 1. Foci of mild hypodensity in the right corona radiata and right basal ganglia region, new since prior CT, could represent small infarcts, age indeterminate. 2. No large acute territorial infarct, hemorrhage or hydrocephalus.  CTA H/N IMPRESSION: 1. No intracranial large vessel occlusion. 2. 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 3. Atherosclerotic changes of the right vertebral artery with severe stenosis at the proximal V4 segment and severe stenosis versus occlusion near the vertebrobasilar junction. 4. 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 5. Mild atherosclerotic changes of the carotid bifurcations without hemodynamically significant stenosis. 6. Complete opacification of the right maxillary sinus which has thickened walls, suggesting chronic disease.  MRI Brain IMPRESSION: 1. No acute intracranial process. 2. Old lacunar infarcts in the right lentiform nucleus and bilateral periventricular white matter, greater on the right. 3. Scattered foci of hemosiderin deposition, primarily within the deep gray nuclei, but with several in the cortex or subcortical white matter. 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 4. Severe mucosal disease in the right maxillary sinus and right anterior ethmoid air cells.  US abdomen  IMPRESSION: 1. Normal sonographic appearance of the gallbladder. 2. Hepatic  steatosis.  Echo: EF 60-65%, G1DD  A1C 4.6 LDL 123, HDL 66 AST 1376, ALT 449     ASSESSMENT & PLAN:   Principal Problem:   Stroke [facility]) Active Problems:   Fall at home, initial encounter    Essential hypertension, benign   Alcohol dependence ([place])   Hypokalemia   Hypomagnesemia   Hyponatremia   Abnormal LFTs   Acute CVA ruled out by MRI, likely TIA  Hx TIA (per patient) Out of window for permissive HTN, TPA Placed on tele bed for observation ASA  (pt has hx of ICH, but CT-head today showed no hemorrhage) Statin, deferred d/t liver enzyme elevation - if trending down would start statin outpatient  fasting lipid panel and HbA1c - as above, no DM, f/u hyperlipidemia outpatient    Fall at home, initial encounter: May be related to alcohol abuse fall precaution PT/OT   Essential hypertension, benign IV hydralazine as needed Lisinopril   Alcohol dependence ([place]) CIWA protocol   Hypokalemia and hypomagnesemia:  Potassium 3.0, magnesium 1.3.  Phosphorus level 2.6 on admission Repleted potassium and magnesium AM labs improved but not resolved, follow outpatient   Hyponatremia:  Sodium 130 on admission IV fluid: 500 cc normal saline, 900 cc/h AM labs improved but not resolved, concern for chronic hyponatremia d/t liver disease, follow outpatient    Abnormal LFTs Hepatic steatosis Patient has a chronic abnormal liver function.  Patient has been following up with GI, last seen by Dr. Marius Ditch 01/10/2022.  Patient had duplex ultrasound of liver on 01/19/2022, which is unremarkable. Liver biopsy was ordered by Dr. Aggie Moats on 01/18/22, but seems be not done. Today his liver function is worse, most likely due to alcohol abuse.  When Dr. Marius Ditch saw patient in clinic, patient stopped drinking alcohol at that time, but obviously he restarted drinking again.  The ratio of AST/ALT is 1376/449, consistent with alcohol abuse. Avoid using Tylenol Follow-up with upper quadrant ultrasound --> showed hepatic steatosis. Follow-up with GI outpatient          Discharge Instructions  Allergies as of 04/08/2022   No Known Allergies      Medication List     TAKE these medications     aspirin EC 81 MG tablet Take 1 tablet (81 mg total) by mouth daily.   folic acid 1 MG tablet Commonly known as: FOLVITE Take 1 tablet by mouth daily.   ibuprofen 200 MG tablet Commonly known as: ADVIL Take 2 tablets (400 mg total) by mouth every 6 (six) hours as needed for fever, headache or mild pain.   IRON PO Take 1 tablet by mouth daily.   lisinopril 5 MG tablet Commonly known as: ZESTRIL Take 5 mg by mouth daily.   multivitamin with minerals Tabs tablet Take 1 tablet by mouth daily. Start taking on: April 09, 2022   potassium chloride SA 20 MEQ tablet Commonly known as: KLOR-CON M Take by mouth.   thiamine 100 MG tablet Commonly known as: VITAMIN B1 Take 1 tablet by mouth daily.   vitamin C 1000 MG tablet Take 1,000 mg by mouth daily.          No Known Allergies   Subjective: pt feeling well this morning, no new weakness or speech difficulty, no concerns for discharge    Discharge Exam: BP 138/89 (BP Location: Right Arm)   Pulse 71   Temp 98.5 F (36.9 C)   Resp 20   Ht 5' 11"$  (1.803 m)   Wt 75.3 kg  SpO2 97%   BMI 23.15 kg/m  General: Pt is alert, awake, not in acute distress Cardiovascular: RRR, S1/S2 +, no rubs, no gallops Respiratory: CTA bilaterally, no wheezing, no rhonchi Abdominal: Soft, NT, ND, bowel sounds + Extremities: no edema, no cyanosis     The results of significant diagnostics from this hospitalization (including imaging, microbiology, ancillary and laboratory) are listed below for reference.     Microbiology: No results found for this or any previous visit (from the past 240 hour(s)).   Labs: BNP (last 3 results) No results for input(s): "BNP" in the last 8760 hours. Basic Metabolic Panel: Recent Labs  Lab 04/07/22 0932 04/08/22 0824  NA 130* 133*  K 3.0* 3.1*  CL 87* 97*  CO2 30 24  GLUCOSE 93 77  BUN 11 13  CREATININE 1.11 0.93  CALCIUM 8.7* 8.0*  MG 1.3*  --   PHOS 2.6  --    Liver Function  Tests: Recent Labs  Lab 04/07/22 0932 04/08/22 0824  AST 1,376* 345*  ALT 449* 231*  ALKPHOS 58 48  BILITOT 2.8* 2.7*  PROT 8.6* 7.0  ALBUMIN 4.8 3.8   No results for input(s): "LIPASE", "AMYLASE" in the last 168 hours. Recent Labs  Lab 04/07/22 2151  AMMONIA 19   CBC: Recent Labs  Lab 04/07/22 0932  WBC 10.1  HGB 16.5  HCT 47.0  MCV 95.9  PLT 147*   Cardiac Enzymes: No results for input(s): "CKTOTAL", "CKMB", "CKMBINDEX", "TROPONINI" in the last 168 hours. BNP: Invalid input(s): "POCBNP" CBG: Recent Labs  Lab 04/07/22 0923  GLUCAP 89   D-Dimer No results for input(s): "DDIMER" in the last 72 hours. Hgb A1c Recent Labs    04/07/22 0932  HGBA1C 4.6*   Lipid Profile Recent Labs    04/08/22 0407  CHOL 212*  HDL 66  LDLCALC 123*  TRIG 115  CHOLHDL 3.2   Thyroid function studies No results for input(s): "TSH", "T4TOTAL", "T3FREE", "THYROIDAB" in the last 72 hours.  Invalid input(s): "FREET3" Anemia work up No results for input(s): "VITAMINB12", "FOLATE", "FERRITIN", "TIBC", "IRON", "RETICCTPCT" in the last 72 hours. Urinalysis    Component Value Date/Time   COLORURINE STRAW (A) 02/23/2018 0135   APPEARANCEUR CLEAR (A) 02/23/2018 0135   APPEARANCEUR Clear 03/25/2014 1100   LABSPEC 1.005 02/23/2018 0135   LABSPEC >1.060 03/25/2014 1100   PHURINE 6.0 02/23/2018 0135   GLUCOSEU NEGATIVE 02/23/2018 0135   GLUCOSEU Negative 03/25/2014 1100   HGBUR NEGATIVE 02/23/2018 0135   HGBUR negative 07/17/2008 0956   BILIRUBINUR NEGATIVE 02/23/2018 0135   BILIRUBINUR Negative 03/25/2014 1100   KETONESUR 5 (A) 02/23/2018 0135   PROTEINUR NEGATIVE 02/23/2018 0135   UROBILINOGEN 0.2 07/17/2008 0956   NITRITE NEGATIVE 02/23/2018 0135   LEUKOCYTESUR NEGATIVE 02/23/2018 0135   LEUKOCYTESUR Negative 03/25/2014 1100   Sepsis Labs Recent Labs  Lab 04/07/22 0932  WBC 10.1   Microbiology No results found for this or any previous visit (from the past 240  hour(s)). Imaging ECHOCARDIOGRAM COMPLETE  Result Date: 04/07/2022    ECHOCARDIOGRAM REPORT   Patient Name:   LO ROSTON Date of Exam: 04/07/2022 Medical Rec #:  [id number]        Height:       71.0 in Accession #:    [id number]       Weight:       175.0 lb Date of Birth:  27-Feb-1971         BSA:  1.992 m Patient Age:    52 years         BP:           143/110 mmHg Patient Gender: M                HR:           76 bpm. Exam Location:  [facility] Procedure: 2D Echo, Cardiac Doppler and Color Doppler Indications:     Stroke  History:         Patient has no prior history of Echocardiogram examinations.                  Stroke; Risk Factors:Hypertension.  Sonographer:     Wenda Low Referring Phys:  [place] Diagnosing Phys: Ida Rogue MD IMPRESSIONS  1. Left ventricular ejection fraction, by estimation, is 60 to 65%. The left ventricle has normal function. The left ventricle has no regional wall motion abnormalities. There is mild left ventricular hypertrophy. Left ventricular diastolic parameters are consistent with Grade I diastolic dysfunction (impaired relaxation).  2. Right ventricular systolic function is normal. The right ventricular size is normal. There is normal pulmonary artery systolic pressure. The estimated right ventricular systolic pressure is [id number] mmHg.  3. The mitral valve is normal in structure. No evidence of mitral valve regurgitation. No evidence of mitral stenosis.  4. The aortic valve is tricuspid. Aortic valve regurgitation is not visualized. No aortic stenosis is present.  5. The inferior vena cava is normal in size with greater than 50% respiratory variability, suggesting right atrial pressure of 3 mmHg. FINDINGS  Left Ventricle: Left ventricular ejection fraction, by estimation, is 60 to 65%. The left ventricle has normal function. The left ventricle has no regional wall motion abnormalities. The left ventricular internal cavity size was normal in size. There is  mild  left ventricular hypertrophy. Left ventricular diastolic parameters are consistent with Grade I diastolic dysfunction (impaired relaxation). Right Ventricle: The right ventricular size is normal. No increase in right ventricular wall thickness. Right ventricular systolic function is normal. There is normal pulmonary artery systolic pressure. The tricuspid regurgitant velocity is 2.57 m/s, and  with an assumed right atrial pressure of 3 mmHg, the estimated right ventricular systolic pressure is [id number] mmHg. Left Atrium: Left atrial size was normal in size. Right Atrium: Right atrial size was normal in size. Pericardium: There is no evidence of pericardial effusion. Mitral Valve: The mitral valve is normal in structure. No evidence of mitral valve regurgitation. No evidence of mitral valve stenosis. MV peak gradient, 2.8 mmHg. The mean mitral valve gradient is 1.0 mmHg. Tricuspid Valve: The tricuspid valve is normal in structure. Tricuspid valve regurgitation is mild . No evidence of tricuspid stenosis. Aortic Valve: The aortic valve is tricuspid. Aortic valve regurgitation is not visualized. No aortic stenosis is present. Aortic valve mean gradient measures 2.0 mmHg. Aortic valve peak gradient measures 4.2 mmHg. Aortic valve area, by VTI measures 2.21 cm. Pulmonic Valve: The pulmonic valve was normal in structure. Pulmonic valve regurgitation is not visualized. No evidence of pulmonic stenosis. Aorta: The aortic root is normal in size and structure. Venous: The inferior vena cava is normal in size with greater than 50% respiratory variability, suggesting right atrial pressure of 3 mmHg. IAS/Shunts: No atrial level shunt detected by color flow Doppler.  LEFT VENTRICLE PLAX 2D LVIDd:         3.70 cm   Diastology LVIDs:         2.60  cm   LV e' medial:    8.27 cm/s LV PW:         1.20 cm   LV E/e' medial:  6.8 LV IVS:        1.20 cm   LV e' lateral:   10.60 cm/s LVOT diam:     2.00 cm   LV E/e' lateral: 5.3 LV SV:          46 LV SV Index:   23 LVOT Area:     3.14 cm  RIGHT VENTRICLE RV Basal diam:  3.15 cm RV Mid diam:    2.90 cm LEFT ATRIUM             Index        RIGHT ATRIUM           Index LA diam:        2.30 cm 1.15 cm/m   RA Area:     11.30 cm LA Vol (A2C):   31.4 ml 15.76 ml/m  RA Volume:   24.40 ml  12.25 ml/m LA Vol (A4C):   10.3 ml 5.17 ml/m LA Biplane Vol: 18.9 ml 9.49 ml/m  AORTIC VALVE                    PULMONIC VALVE AV Area (Vmax):    2.70 cm     PV Vmax:       0.95 m/s AV Area (Vmean):   2.47 cm     PV Peak grad:  3.6 mmHg AV Area (VTI):     2.21 cm AV Vmax:           103.00 cm/s AV Vmean:          67.600 cm/s AV VTI:            0.206 m AV Peak Grad:      4.2 mmHg AV Mean Grad:      2.0 mmHg LVOT Vmax:         88.40 cm/s LVOT Vmean:        53.100 cm/s LVOT VTI:          0.145 m LVOT/AV VTI ratio: 0.70  AORTA Ao Root diam: 3.40 cm MITRAL VALVE               TRICUSPID VALVE MV Area (PHT): 3.19 cm    TR Peak grad:   26.4 mmHg MV Area VTI:   2.07 cm    TR Vmax:        257.00 cm/s MV Peak grad:  2.8 mmHg MV Mean grad:  1.0 mmHg    SHUNTS MV Vmax:       0.84 m/s    Systemic VTI:  0.14 m MV Vmean:      53.6 cm/s   Systemic Diam: 2.00 cm MV Decel Time: 238 msec MV E velocity: 56.30 cm/s MV A velocity: 63.60 cm/s MV E/A ratio:  0.89 Ida Rogue MD Electronically signed by Ida Rogue MD Signature Date/Time: 04/07/2022/4:46:27 PM    Final    MR BRAIN WO CONTRAST  Result Date: 04/07/2022 CLINICAL DATA:  Neuro deficit, acute, stroke suspected.  Dizziness. EXAM: MRI HEAD WITHOUT CONTRAST TECHNIQUE: Multiplanar, multiecho pulse sequences of the brain and surrounding structures were obtained without intravenous contrast. COMPARISON:  Head CT 04/07/2022 and older.  CTA head/neck 04/07/2022. FINDINGS: Brain: No acute infarct or hemorrhage. Old lacunar infarcts in the right lentiform nucleus and bilateral periventricular white matter, greater on the right. Scattered foci  of hemosiderin deposition, primarily within the  deep gray nuclei, but with several in the cortex or subcortical white matter. Sulcal hemosiderin deposition in the medial right parietal lobe. No mass or midline shift. No hydrocephalus or extra-axial collection. Basilar cisterns are patent. Vascular: Normal flow voids. Skull and upper cervical spine: Normal marrow signal. Sinuses/Orbits: Severe mucosal disease in the right maxillary sinus and right anterior ethmoid air cells. Other: None. IMPRESSION: 1. No acute intracranial process. 2. Old lacunar infarcts in the right lentiform nucleus and bilateral periventricular white matter, greater on the right. 3. Scattered foci of hemosiderin deposition, primarily within the deep gray nuclei, but with several in the cortex or subcortical white matter. 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 4. Severe mucosal disease in the right maxillary sinus and right anterior ethmoid air cells. Electronically Signed   By: Emmit Alexanders M.D.   On: 04/07/2022 14:30   US Abdomen Limited RUQ (LIVER/GB)  Result Date: 04/07/2022 CLINICAL DATA:  Abnormal LFTs EXAM: ULTRASOUND ABDOMEN LIMITED RIGHT UPPER QUADRANT COMPARISON:  None Available. FINDINGS: Gallbladder: No gallstones or wall thickening visualized. No sonographic Murphy sign noted by sonographer. Common bile duct: Diameter: 2.3 mm Liver: No focal lesion identified. Increased parenchymal echogenicity. Portal vein is patent on color Doppler imaging with normal direction of blood flow towards the liver. Other: None. IMPRESSION: 1. Normal sonographic appearance of the gallbladder. 2. Hepatic steatosis. Electronically Signed   By: Yetta Glassman M.D.   On: 04/07/2022 14:12   CT ANGIO HEAD NECK W WO CM  Result Date: 04/07/2022 CLINICAL DATA:  Neuro deficit, acute, stroke suspected. EXAM: CT ANGIOGRAPHY HEAD AND NECK TECHNIQUE: Multidetector CT imaging of the head and neck was performed using the standard protocol during bolus administration of intravenous  contrast. Multiplanar CT image reconstructions and MIPs were obtained to evaluate the vascular anatomy. Carotid stenosis measurements (when applicable) are obtained utilizing NASCET criteria, using the distal internal carotid diameter as the denominator. RADIATION DOSE REDUCTION: This exam was performed according to the departmental dose-optimization program which includes automated exposure control, adjustment of the mA and/or kV according to patient size and/or use of iterative reconstruction technique. CONTRAST:  30m OMNIPAQUE IOHEXOL 350 MG/ML SOLN COMPARISON:  Head CT performed April 07, 2022. FINDINGS: CTA NECK FINDINGS Aortic arch: Common origin of the innominate and left common carotid artery from the aortic arch. Imaged portion shows no evidence of aneurysm or dissection. No significant stenosis of the major arch vessel origins. Right carotid system: Calcified atherosclerotic changes of the right carotid bifurcation without hemodynamically significant stenosis. Left carotid system: Mild atherosclerotic changes of left carotid bifurcation without hemodynamically significant stenosis. Vertebral arteries: Dominant left vertebral artery has normal caliber throughout its cervical course. Hypoplastic right vertebral artery has normal caliber throughout its cervical course. Skeleton: Negative. Other neck: Complete opacification of the right maxillary sinus which has thickened walls, suggesting chronic disease. Upper chest: Negative. Review of the MIP images confirms the above findings CTA HEAD FINDINGS Anterior circulation: No significant stenosis, proximal occlusion, aneurysm, or vascular malformation. Posterior circulation: Calcified atherosclerotic changes at the proximal V4 segment of the right vertebral artery with severe focal stenosis. There is also decrease caliber distal to the right PICA takeoff with severe stenosis versus occlusion near the vertebrobasilar junction. Mild atherosclerotic plaques in  the intracranial left vertebral artery without significant stenosis. Small caliber of the basilar artery, particularly at its distal aspect with mild luminal irregularity suggestive of a combination of atherosclerotic disease and hypoplastic vertebrobasilar system in the  setting of bilateral fetal PCAs. There is no hemodynamically significant of the basilar artery. Atherosclerotic changes are noted along the right posterior cerebral artery with severe stenosis at the P1-P2 junction. The left PCA is maintained. Venous sinuses: As permitted by contrast timing, patent. Anatomic variants: Aplastic versus severely hypoplastic right A1/ACA. Bilateral fetal PCA. Review of the MIP images confirms the above findings IMPRESSION: 1. No intracranial large vessel occlusion. 2. 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 3. Atherosclerotic changes of the right vertebral artery with severe stenosis at the proximal V4 segment and severe stenosis versus occlusion near the vertebrobasilar junction. 4. 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 5. Mild atherosclerotic changes of the carotid bifurcations without hemodynamically significant stenosis. 6. Complete opacification of the right maxillary sinus which has thickened walls, suggesting chronic disease. Electronically Signed   By: Pedro Earls M.D.   On: 04/07/2022 10:43   CT HEAD CODE STROKE WO CONTRAST`  Result Date: 04/07/2022 CLINICAL DATA:  Code stroke.  Transient ischemic attack. EXAM: CT HEAD WITHOUT CONTRAST TECHNIQUE: Contiguous axial images were obtained from the base of the skull through the vertex without intravenous contrast. RADIATION DOSE REDUCTION: This exam was performed according to the  departmental dose-optimization program which includes automated exposure control, adjustment of the mA and/or kV according to patient size and/or use of iterative reconstruction technique. COMPARISON:  Head CT February 23, 2018. FINDINGS: Brain: Foci of mild hypodensity are seen in the right corona radiata and right basal ganglia region, new since prior CT, could represent small infarcts, age indeterminate. Hypodensity in the right corona radiata at series 3, image 17 appears similar to prior CT. No large acute territorial infarct, hemorrhage, hydrocephalus or extra-axial collection. Vascular: Calcified plaques in the bilateral intracranial vertebral arteries and carotid siphons. Skull: Normal. Negative for fracture or focal lesion. Sinuses/Orbits: Right maxillary inflammatory disease, only partially visualized. The orbits are maintained. Other: None. ASPECTS [facility] Stroke Program Early CT Score) - Ganglionic level infarction (caudate, lentiform nuclei, internal capsule, insula, M1-M3 cortex): 6 - Supraganglionic infarction (M4-M6 cortex): 3 Total score (0-10 with 10 being normal): 9 IMPRESSION: 1. Foci of mild hypodensity in the right corona radiata and right basal ganglia region, new since prior CT, could represent small infarcts, age indeterminate. 2. No large acute territorial infarct, hemorrhage or hydrocephalus. 3. Aspects is 9. These results were called by telephone at the time of interpretation on 04/07/2022 at 9:50 am to provider [facility] , who verbally acknowledged these results. Electronically Signed   By: Pedro Earls M.D.   On: 04/07/2022 09:50      Time coordinating discharge: over 30 minutes  SIGNED:  Emeterio Reeve DO [facility]

## 2022-04-08 NOTE — Plan of Care (Signed)
All PT goals met.  Lavone Nian, PT, DPT 04/08/22, 12:08 PM  Problem: Acute Rehab PT Goals(only PT should resolve) Goal: Pt Will Perform Standing Balance Or Pre-Gait Outcome: Completed/Met Goal: Pt Will Ambulate Outcome: Completed/Met Goal: Pt Will Go Up/Down Stairs Outcome: Completed/Met

## 2022-04-08 NOTE — Progress Notes (Signed)
SLP Cancellation Note  Patient Details Name: Levi Mejia MRN: [id number] DOB: 07/25/1970   Cancelled treatment:       Reason Eval/Treat Not Completed: SLP screened, no needs identified, will sign off (chart reviewed; consulted NSG then met w/ pt)  Pt denied any difficulty swallowing and is currently on a regular diet; tolerates swallowing pills w/ water per NSG. He stated he swallows them 1 at a time d/t "tightness in my Esophagus" -- feels like "it won't go down sometimes and I have to vomit it back up". He endorsed difficulty w/ Meats as well.  Pt conversed in conversation w/out expressive/receptive deficits noted; pt denied any speech-language deficits. Speech clear. No further skilled ST services indicated as pt appears at his baseline.Encouraged pt to f/u w/ MD/PCP/GI for consultation re: his Esophageal phase Dysmotility post d/c home. Pt agreed. NSG to reconsult if any change in status while admitted. MD updated on above.        Orinda Kenner, MS, CCC-SLP Speech Language Pathologist Rehab Services; [place] [phone] (ascom) Keno Caraway 04/08/2022, 10:49 AM

## 2022-04-08 NOTE — Hospital Course (Signed)
Levi Mejia is a 52 y.o. male with medical history significant of alcohol abuse, hypertension, mini stroke per pt, ADHD, ICH, chronic abnormal liver function, seizure, ICH, who presents to ED from home on 04/07/2022 with left-sided weakness, right facial droop, dizziness, fall. He went to see his PCP 02/07 and was told to go to the ED for an MRI but decided not to.  02/08: in ED - VSS, Low sodium 130, low K 3.0, elevated AS/ALT, no concerns on CBC.  CT head, CTA H/N, MRI brain as below -   Consultants:  ***  Procedures: ***   Pertinent imaging / tests:   CT Head IMPRESSION: 1. Foci of mild hypodensity in the right corona radiata and right basal ganglia region, new since prior CT, could represent small infarcts, age indeterminate. 2. No large acute territorial infarct, hemorrhage or hydrocephalus.  CTA H/N IMPRESSION: 1. No intracranial large vessel occlusion. 2. 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 3. Atherosclerotic changes of the right vertebral artery with severe stenosis at the proximal V4 segment and severe stenosis versus occlusion near the vertebrobasilar junction. 4. 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 5. Mild atherosclerotic changes of the carotid bifurcations without hemodynamically significant stenosis. 6. Complete opacification of the right maxillary sinus which has thickened walls, suggesting chronic disease.  MRI Brain IMPRESSION: 1. No acute intracranial process. 2. Old lacunar infarcts in the right lentiform nucleus and bilateral periventricular white matter, greater on the right. 3. Scattered foci of hemosiderin deposition, primarily within the deep gray nuclei, but with several in the  cortex or subcortical white matter. 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 4. Severe mucosal disease in the right maxillary sinus and right anterior ethmoid air cells.  US abdomen  IMPRESSION: 1. Normal sonographic appearance of the gallbladder. 2. Hepatic steatosis.  Echo: EF 60-65%, G1DD  A1C 4.6 LDL 123, HDL 66 AST 1376, ALT 449     ASSESSMENT & PLAN:   Principal Problem:   Stroke [facility]) Active Problems:   Fall at home, initial encounter   Essential hypertension, benign   Alcohol dependence ([place])   Hypokalemia   Hypomagnesemia   Hyponatremia   Abnormal LFTs   Possible stroke (HCC) Hx TIA (per patient) Out of window for permissive HTN, TPA Placed on tele bed for observation - Obtain MRI-brain  - Patient is out of window for permissive hypertension ASA  (pt has hx of ICH, but CT-head today showed no hemorrhage) Statin, deferred d/t liver enzyme elevation - if trending down would start statin outpatient  fasting lipid panel and HbA1c - as above, no DM, f/u hyperlipidemia outpatient  - Check UDS  - PT/OT consult - f/u ammonia level   Fall at home, initial encounter: May be related to alcohol abuse fall precaution PT/OT   Essential hypertension, benign IV hydralazine as needed Lisinopril   Alcohol dependence (HCC) CIWA protocol   Hypokalemia and hypomagnesemia:  Potassium 3.0, magnesium 1.3.  Phosphorus level 2.6 on admission Repleted potassium and magnesium AM labs ***   Hyponatremia:  Sodium 130 on admission IV fluid: 500 cc normal saline, 900 cc/h AM labs ***   Abnormal LFTs Hepatic steatosis Patient has a chronic abnormal liver function.  Patient has been following up with GI, last seen by Dr. Marius Ditch 01/10/2022.  Patient  had duplex ultrasound of liver on 01/19/2022, which is unremarkable. Liver biopsy was ordered by Dr. Aggie Moats on 01/18/22, but seems be not done. Today his liver function is worse, most likely due to alcohol  abuse.  When Dr. Marius Ditch saw patient in clinic, patient stopped drinking alcohol at that time, but obviously he restarted drinking again.  The ratio of AST/ALT is 1376/449, consistent with alcohol abuse. Avoid using Tylenol Follow-up with upper quadrant ultrasound --> showed hepatic steatosis. Follow-up with GI outpatient  check ammonia level      DVT prophylaxis: *** Pertinent IV fluids/nutrition: *** Central lines / invasive devices: ***  Code Status: ***  Current Admission Status: ***  TOC needs / Dispo plan: *** Barriers to discharge / significant pending items: ***

## 2022-04-08 NOTE — Evaluation (Signed)
Occupational Therapy Evaluation Patient Details Name: Levi Mejia MRN: [id number] DOB: June 26, 1970 Today's Date: 04/08/2022   History of Present Illness Pt is a 52 year old male presenting with left-sided weakness and numbness, vomiting, unsteady sensation; PMH significant for alcohol abuse, hypertension, mini stroke per pt, ADHD, ICH, chronic abnormal liver function, seizure, ICH; MRI shows: No acute infarct or hemorrhage. Old lacunar infarcts in the  right lentiform nucleus and bilateral periventricular white matter,  greater on the right   Clinical Impression   Chart reviewed, pt greeted in room agreeable to OT evaluation. Pt is alert and oriented x4. Pt endorses symptoms seem to have resolved besides blurred vision. Pt reports he feels close to his typical baseline. 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 Pt is performing ADL/functional mobitliy at a MOD I level. BUE strength, sensation appears WFL. No acute OT need identified at this time. OT will sign off, please re consult if there is a change in functional status.      Recommendations for follow up therapy are one component of a multi-disciplinary discharge planning process, led by the attending physician.  Recommendations may be updated based on patient status, additional functional criteria and insurance authorization.   Follow Up Recommendations  No OT follow up     Assistance Recommended at Discharge PRN  Patient can return home with the following      Functional Status Assessment  Patient has not had a recent decline in their functional status  Equipment Recommendations  None recommended by OT    Recommendations for Other Services       Precautions / Restrictions Precautions Precautions: Fall Restrictions Weight Bearing Restrictions: No      Mobility Bed Mobility Overal bed mobility: Independent                  Transfers                          Balance Overall balance  assessment: Needs assistance Sitting-balance support: Feet supported Sitting balance-[name]: Normal     Standing balance support: No upper extremity supported Standing balance-[name]: Good                             ADL either performed or assessed with clinical judgement   ADL Overall ADL's : Needs assistance/impaired     Grooming: Wash/dry hands;Standing;Modified independent               Lower Body Dressing: Modified independent   Toilet Transfer: Modified Independent;Ambulation   Toileting- Clothing Manipulation and Hygiene: Modified independent       Functional mobility during ADLs: Modified independent;Independent (200' in hallway)       Vision Patient Visual Report: Blurring of vision (reports blurred vision continues to improve however symptoms persist) Vision Assessment?: Yes Eye Alignment: Within Functional Limits Ocular Range of Motion: Within Functional Limits Tracking/Visual Pursuits: Able to track stimulus in all quads without difficulty Convergence: Within functional limits Additional Comments: has a baseline L eye strabismus     Perception     Praxis      Pertinent Vitals/Pain Pain Assessment Pain Assessment: No/denies pain     Hand Dominance Right   Extremity/Trunk Assessment Upper Extremity Assessment Upper Extremity Assessment: Overall WFL for tasks assessed   Lower Extremity Assessment Lower Extremity Assessment: Defer to PT evaluation;Overall [facility] for tasks assessed   Cervical / Trunk  Assessment Cervical / Trunk Assessment: Normal   Communication Communication Communication: No difficulties   Cognition Arousal/Alertness: Awake/alert Behavior During Therapy: WFL for tasks assessed/performed Overall Cognitive Status: Within Functional Limits for tasks assessed                                       General Comments       Exercises Other Exercises Other Exercises: edu re: role of OT,  role of rehab, discharge recommendations, home safety, falls prevention   Shoulder Instructions      Home Living Family/patient expects to be discharged to:: Private residence Living Arrangements: Spouse/significant other;Children Available Help at Discharge: Available PRN/intermittently   Home Access: Level entry     Home Layout: Two level Alternate Level Stairs-Number of Steps: flight             Home Equipment: None          Prior Functioning/Environment Prior Level of Function : Independent/Modified Independent;Driving             Mobility Comments: amb with no AD ADLs Comments: indep in ADL/IADL, works as a Therapist, music Problem List:        OT Treatment/Interventions:      OT Goals(Current goals can be found in the care plan section)    OT Frequency:      Co-evaluation              AM-PAC OT "6 Clicks" Daily Activity     Outcome Measure Help from another person eating meals?: None Help from another person taking care of personal grooming?: None Help from another person toileting, which includes using toliet, bedpan, or urinal?: None Help from another person bathing (including washing, rinsing, drying)?: None Help from another person to put on and taking off regular upper body clothing?: None Help from another person to put on and taking off regular lower body clothing?: None 6 Click Score: 24   End of Session Nurse Communication: Mobility status  Activity Tolerance: Patient tolerated treatment well Patient left: in bed;with call bell/phone within reach;with family/visitor present                   Time: [id number] OT Time Calculation (min): 11 min Charges:  OT General Charges $OT Visit: 1 Visit OT Evaluation $OT Eval Low Complexity: 1 Low  Shanon Payor, OTD OTR/L  04/08/22, 9:54 AM

## 2022-04-13 LAB — POCT I-STAT CREATININE: Creatinine, Ser: 1.2 mg/dL (ref 0.61–1.24)

## 2022-04-13 LAB — VITAMIN B1: Vitamin B1 (Thiamine): 124 nmol/L (ref 66.5–200.0)

## 2022-04-19 ENCOUNTER — Ambulatory Visit: Payer: PRIVATE HEALTH INSURANCE | Admitting: Gastroenterology

## 2022-06-23 ENCOUNTER — Other Ambulatory Visit: Payer: Self-pay | Admitting: Family Medicine

## 2022-06-23 ENCOUNTER — Encounter: Payer: Self-pay | Admitting: Family Medicine

## 2022-06-23 ENCOUNTER — Ambulatory Visit: Payer: PRIVATE HEALTH INSURANCE | Admitting: Family Medicine

## 2022-06-23 VITALS — BP 110/80 | HR 82 | Temp 98.2°F | Ht 70.25 in | Wt 154.4 lb

## 2022-06-23 DIAGNOSIS — Z125 Encounter for screening for malignant neoplasm of prostate: Secondary | ICD-10-CM | POA: Diagnosis not present

## 2022-06-23 DIAGNOSIS — I10 Essential (primary) hypertension: Secondary | ICD-10-CM | POA: Diagnosis not present

## 2022-06-23 DIAGNOSIS — E785 Hyperlipidemia, unspecified: Secondary | ICD-10-CM | POA: Diagnosis not present

## 2022-06-23 DIAGNOSIS — R634 Abnormal weight loss: Secondary | ICD-10-CM | POA: Diagnosis not present

## 2022-06-23 DIAGNOSIS — F1029 Alcohol dependence with unspecified alcohol-induced disorder: Secondary | ICD-10-CM

## 2022-06-23 DIAGNOSIS — R7989 Other specified abnormal findings of blood chemistry: Secondary | ICD-10-CM

## 2022-06-23 DIAGNOSIS — E871 Hypo-osmolality and hyponatremia: Secondary | ICD-10-CM

## 2022-06-23 DIAGNOSIS — E876 Hypokalemia: Secondary | ICD-10-CM

## 2022-06-23 DIAGNOSIS — Z8673 Personal history of transient ischemic attack (TIA), and cerebral infarction without residual deficits: Secondary | ICD-10-CM | POA: Diagnosis not present

## 2022-06-23 DIAGNOSIS — Z1211 Encounter for screening for malignant neoplasm of colon: Secondary | ICD-10-CM

## 2022-06-23 LAB — COMPREHENSIVE METABOLIC PANEL
ALT: 141 U/L — ABNORMAL HIGH (ref 0–53)
AST: 385 U/L — ABNORMAL HIGH (ref 0–37)
Albumin: 4.3 g/dL (ref 3.5–5.2)
Alkaline Phosphatase: 74 U/L (ref 39–117)
BUN: 7 mg/dL (ref 6–23)
CO2: 27 mEq/L (ref 19–32)
Calcium: 9 mg/dL (ref 8.4–10.5)
Chloride: 92 mEq/L — ABNORMAL LOW (ref 96–112)
Creatinine, Ser: 0.77 mg/dL (ref 0.40–1.50)
GFR: 103.45 mL/min (ref >60.00)
Glucose, Bld: 80 mg/dL (ref 70–99)
Potassium: 3.5 mEq/L (ref 3.5–5.1)
Sodium: 135 mEq/L (ref 135–145)
Total Bilirubin: 1.9 mg/dL — ABNORMAL HIGH (ref 0.2–1.2)
Total Protein: 7.5 g/dL (ref 6.0–8.3)

## 2022-06-23 LAB — LIPID PANEL
Cholesterol: 228 mg/dL — ABNORMAL HIGH (ref 0–200)
HDL: 95.5 mg/dL (ref >39.00)
LDL Cholesterol: 118 mg/dL — ABNORMAL HIGH (ref 0–99)
NonHDL: 132.51
Total CHOL/HDL Ratio: 2
Triglycerides: 74 mg/dL (ref 0.0–149.0)
VLDL: 14.8 mg/dL (ref 0.0–40.0)

## 2022-06-23 LAB — CBC WITH DIFFERENTIAL/PLATELET
Basophils Absolute: 0.1 10*3/uL (ref 0.0–0.1)
Basophils Relative: 1.4 % (ref 0.0–3.0)
Eosinophils Absolute: 0.1 10*3/uL (ref 0.0–0.7)
Eosinophils Relative: 0.7 % (ref 0.0–5.0)
HCT: 41 % (ref 39.0–52.0)
Hemoglobin: 14.3 g/dL (ref 13.0–17.0)
Lymphocytes Relative: 20.6 % (ref 12.0–46.0)
Lymphs Abs: 1.7 10*3/uL (ref 0.7–4.0)
MCHC: 34.9 g/dL (ref 30.0–36.0)
MCV: 105.7 fl — ABNORMAL HIGH (ref 78.0–100.0)
Monocytes Absolute: 0.8 10*3/uL (ref 0.1–1.0)
Monocytes Relative: 10 % (ref 3.0–12.0)
Neutro Abs: 5.5 10*3/uL (ref 1.4–7.7)
Neutrophils Relative %: 67.3 % (ref 43.0–77.0)
Platelets: 195 10*3/uL (ref 150.0–400.0)
RBC: 3.88 Mil/uL — ABNORMAL LOW (ref 4.22–5.81)
RDW: 14.1 % (ref 11.5–15.5)
WBC: 8.1 10*3/uL (ref 4.0–10.5)

## 2022-06-23 LAB — TSH: TSH: 1.13 u[IU]/mL (ref 0.35–5.50)

## 2022-06-23 LAB — HEMOGLOBIN A1C: Hgb A1c MFr Bld: 5.1 % (ref 4.6–6.5)

## 2022-06-23 LAB — VITAMIN B12: Vitamin B-12: 498 pg/mL (ref 211–911)

## 2022-06-23 LAB — VITAMIN D 25 HYDROXY (VIT D DEFICIENCY, FRACTURES): VITD: 18.21 ng/mL — ABNORMAL LOW (ref 30.00–100.00)

## 2022-06-23 LAB — MAGNESIUM: Magnesium: 1.3 mg/dL — ABNORMAL LOW (ref 1.5–2.5)

## 2022-06-23 LAB — PSA: PSA: 1.03 ng/mL (ref 0.10–4.00)

## 2022-06-23 MED ORDER — LOSARTAN POTASSIUM 25 MG PO TABS: 12.5000 mg | ORAL_TABLET | Freq: Every day | ORAL | 0 refills | Status: DC

## 2022-06-23 NOTE — Patient Instructions (Addendum)
It was a pleasure meeting you today. Thank you for allowing me to take part in your health care.  Our goals for today as we discussed include:  We will get some labs today.  If they are abnormal or we need to do something about them, I will call you.  If they are normal, I will send you a message on MyChart (if it is active) or a letter in the mail.  If you don't hear from [place] in 2 weeks, please call the office at the number below.   Referral sent to GI for colonoscopy.  Please call Dr. Verdis Prime office to schedule an appointment Address: [address], [place], [place] [address] Phone: [phone] to discuss weight loss and difficulty swallowing.   Referral sent to neurology  Stop lisinopril Start losartan 12.5 mg daily.  Monitor blood pressure.  Goal less than 130/80.    Recommend Tetanus Vaccination.  This is given every 10 years.   Recommend Shingles vaccine.  This is a 2 dose series and can be given at your local pharmacy.  Please talk to your pharmacist about this.   Recommend hepatitis B vaccine.  Follow-up in 4 weeks  If you have any questions or concerns, please do not hesitate to call the office at [phone].  I look forward to our next visit and until then take care and stay safe.  Regards,   Dana Allan, MD   [facility]

## 2022-06-23 NOTE — Progress Notes (Signed)
SUBJECTIVE:   Chief Complaint  Patient presents with   Establish Care    New patient   HPI Patient presents to clinic to establish care.  Patient's main concern today is difficulty swallowing.  Reports unintentional weight loss of 20 pounds within the last year.  Denies any fevers, nausea/vomiting, hematemesis, hematochezia, hematuria, abdominal pain, diarrhea or constipation. Patient reports difficulty swallowing solids.  Able to swallow liquids without difficulty.  Denies any pain with swallowing.  Hypertension Asymptomatic.  Takes Zestril 5 mg daily.  Does not check blood pressure at home.  Denies chest pain, shortness of breath or lower extremity edema.  EtOH dependence Reports drinks 1/5 of alcohol every 2 days.  Was recently hospitalized for alcohol withdrawal.  Denies any DTs or seizure activity.  Was never intubated.  Not currently taking thiamine, folic acid, potassium as previously prescribed.  Recent CVA Patient recently admitted to [facility] on 04/07/2022 for right-sided facial droop, dizziness, left-sided weakness and numbness, vomiting and unsteadiness.  Labs significant for elevated LFTs, hypokalemia, hyponatremia.  CT head showed foci of mild hypodensity in the right corona radiata and right basal ganglia, new since prior CT which possibly represented small infarcts of indeterminate age.  No acute infarct or hemorrhage seen.  MRI head showed old lacunar infarcts in the right lentiform nucleus and by lateral periventricular white matter, scattered foci of hemosiderin deposition favoring to represent hypertensive microangiopathy.  CTA head and neck atherosclerotic changes right PCA with severe stenosis at P1-P2 junction, atherosclerotic changes of the right vertebral artery with severe stenosis of proximal V4 segment and severe stenosis near the vertebrobasilar junction.   Elevated LFTs Recent right upper quadrant abdomen ultrasound showing hepatic steatosis.  CT abdomen showed  marked hepatic stenosis.  Was previously seen by Dr. Allegra Lai 11/23.  Thought to have alcoholic hepatitis.  Liver biopsy had been ordered at that time but not completed.  Abdominal exam concerning for hepatomegaly.  No abdominal pain.  History of seizures Patient denied any seizure activity.  Chart review revealed admission to [facility] for seizure activity 12/20/2021 where he was found to have right occipital lobe intraparenchymal hemorrhage.  EtOH at that time.  PERTINENT PMH / PSH: Hypertension EtOH abuse ADHD ICH Elevated LFTs History of seizures   OBJECTIVE:  BP 110/80   Pulse 82   Temp 98.2 F (36.8 C) (Oral)   Ht 5' 10.25" (1.784 m)   Wt 154 lb 6 oz (70 kg)   SpO2 99%   BMI 21.99 kg/m    Physical Exam Vitals reviewed.  HENT:     Head: Normocephalic.     Right Ear: Tympanic membrane, ear canal and external ear normal.     Left Ear: Tympanic membrane, ear canal and external ear normal.     Nose: Nose normal.     Mouth/Throat:     Mouth: Mucous membranes are moist.  Eyes:     Conjunctiva/sclera: Conjunctivae normal.     Pupils: Pupils are equal, round, and reactive to light.  Neck:     Thyroid: No thyromegaly or thyroid tenderness.     Vascular: No carotid bruit.  Cardiovascular:     Rate and Rhythm: Normal rate and regular rhythm.     Pulses: Normal pulses.     Heart sounds: Normal heart sounds.  Pulmonary:     Effort: Pulmonary effort is normal.     Breath sounds: Normal breath sounds.  Abdominal:     General: Abdomen is flat. Bowel sounds are normal.  Palpations: Abdomen is soft. There is hepatomegaly. There is no shifting dullness, fluid wave or pulsatile mass.     Tenderness: There is no abdominal tenderness. There is no guarding or rebound.  Musculoskeletal:        General: Normal range of motion.     Cervical back: Normal range of motion and neck supple.     Right lower leg: No edema.     Left lower leg: No edema.  Lymphadenopathy:     Cervical: No  cervical adenopathy.  Neurological:     Mental Status: He is alert.  Psychiatric:        Mood and Affect: Mood normal.        Behavior: Behavior normal.        Thought Content: Thought content normal.        Judgment: Judgment normal.       06/23/2022   10:04 AM 11/07/2018    7:50 AM 09/30/2016    8:03 AM  Depression screen PHQ 2/9  Decreased Interest 0 0 0  Down, Depressed, Hopeless 0 0 0  PHQ - 2 Score 0 0 0  Altered sleeping 3 0   Tired, decreased energy 2 0   Change in appetite 2 0   Feeling bad or failure about yourself  0 0   Trouble concentrating 0 0   Moving slowly or fidgety/restless 0 0   Suicidal thoughts 0 0   PHQ-9 Score 7 0   Difficult doing work/chores Somewhat difficult Not difficult at all        06/23/2022   10:05 AM  GAD 7 : Generalized Anxiety Score  Nervous, Anxious, on Edge 0  Control/stop worrying 0  Worry too much - different things 0  Trouble relaxing 2  Restless 0  Easily annoyed or irritable 0  Afraid - awful might happen 0  Total GAD 7 Score 2  Anxiety Difficulty Not difficult at all      ASSESSMENT/PLAN:  Alcohol dependence with unspecified alcohol-induced disorder [facility]) Assessment & Plan: Chronic use of alcohol since age 43.  Drinks 1/5 of alcohol daily. Offered referral for rehab, patient declined due to for finances. Check CBC, ferritin, vitamin B1, folate, magnesium, phosphate given history  Recommend limiting alcohol use  Orders: -     Magnesium -     Vitamin B1  Weight loss Assessment & Plan: Chronic.  Unintentional 20 pound weight loss in the last year.  Has not had EGD or recent colonoscopy.  Was previously seen by Dr. Allegra Lai 11/23 for alcoholic hepatitis.  No family history of colon cancer.  No smoking history.  Intermittent use of THC edibles and daily alcohol use. Referral sent for colonoscopy and EGD.   Have requested the patient call Dr. Verdis Prime office to request an appointment. Check TSH, CBC, CMET, A1c  today.     Orders: -     Hemoglobin A1c -     CBC with Differential/Platelet -     TSH -     VITAMIN D 25 Hydroxy (Vit-D Deficiency, Fractures) -     Vitamin B12 -     Ambulatory referral to Gastroenterology -     Iron, TIBC and Ferritin Panel  History of CVA (cerebrovascular accident) Assessment & Plan: History of CVA.  Recent admission to [facility].  CT showing new foci of mild hypodensity right corona radiata and basal ganglia new since last imaging.  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  No deficits on exam today. Has not had follow-up with neurology since discharge from [facility]. Blood pressure well-controlled. Not currently on statin secondary to elevated LFTs  On aspirin therapy Referral sent to neurology  Orders: -     Ambulatory referral to Neurology  Primary hypertension Assessment & Plan: Chronic.  Well-controlled on current antihypertensive however given chronic cough will switch to ARB Discontinue lisinopril  Start losartan 25 mg daily Recommend EtOH cessation Check c-Met  Orders: -     Comprehensive metabolic panel -     Losartan Potassium; Take 0.5 tablets (12.5 mg total) by mouth daily.  Dispense: 90 tablet; Refill: 0  Hyperlipidemia, unspecified hyperlipidemia type Assessment & Plan: Chronic. Not currently on statin therapy secondary to elevated LFTs Check fasting lipids.    Orders: -     Lipid panel  Screening PSA (prostate specific antigen) Assessment & Plan: Asymptomatic. Check PSA  Orders: -     PSA  Colon cancer screening Assessment & Plan: Referral sent for colon cancer screening  Orders: -     Ambulatory referral to Gastroenterology  Hypokalemia -     Comprehensive metabolic panel  Abnormal LFTs Assessment & Plan: Chronic.  Has established relationship with GI Recheck LFTs Recommend follow-up with GI for continued evaluation.  Orders: -     Ambulatory referral to  Gastroenterology  Hyponatremia Assessment & Plan: Chronic.  Likely secondary to EtOH use Check labs  Orders: -     Comprehensive metabolic panel  Hypomagnesemia Assessment & Plan: Recent hospitalization showed hypomagnesemia. Check magnesium Mag 1.3. Referral sent to infusion center t for infusion of IV magnesium 4 g as unable to tolerate p.o. secondary to dysphagia    HCM Colonoscopy referral sent Recommend Tdap Recommend shingles Recommend Twinrix if not completed Hepatitis C/HIV screening completed Prostate cancer screening today    PDMP reviewed  Return in about 4 weeks (around 07/21/2022) for PCP.  Dana Allan, MD

## 2022-06-24 ENCOUNTER — Encounter: Payer: Self-pay | Admitting: Family Medicine

## 2022-06-24 ENCOUNTER — Other Ambulatory Visit: Payer: Self-pay | Admitting: Family Medicine

## 2022-06-24 MED ORDER — MAGNESIUM SULFATE 4 GM/100ML IV SOLN: 4.0000 g | Freq: Once | INTRAVENOUS | 0 refills | Status: AC

## 2022-06-27 LAB — IRON,TIBC AND FERRITIN PANEL
%SAT: 93 % (calc) — ABNORMAL HIGH (ref 20–48)
Ferritin: 1557 ng/mL — ABNORMAL HIGH (ref 38–380)
Iron: 242 ug/dL — ABNORMAL HIGH (ref 50–180)
TIBC: 261 mcg/dL (calc) (ref 250–425)

## 2022-06-27 LAB — VITAMIN B1: Vitamin B1 (Thiamine): 7 nmol/L — ABNORMAL LOW (ref 8–30)

## 2022-06-29 ENCOUNTER — Inpatient Hospital Stay
Admission: EM | Admit: 2022-06-29 | Discharge: 2022-07-04 | DRG: 896 | Disposition: A | Discharge status: Home / Self Care | Payer: PRIVATE HEALTH INSURANCE | Attending: Osteopathic Medicine | Admitting: Osteopathic Medicine

## 2022-06-29 ENCOUNTER — Emergency Department: Payer: PRIVATE HEALTH INSURANCE

## 2022-06-29 ENCOUNTER — Other Ambulatory Visit: Payer: Self-pay

## 2022-06-29 ENCOUNTER — Encounter: Payer: Self-pay | Admitting: Emergency Medicine

## 2022-06-29 DIAGNOSIS — F10231 Alcohol dependence with withdrawal delirium: Secondary | ICD-10-CM | POA: Diagnosis not present

## 2022-06-29 DIAGNOSIS — E785 Hyperlipidemia, unspecified: Secondary | ICD-10-CM | POA: Diagnosis present

## 2022-06-29 DIAGNOSIS — E43 Unspecified severe protein-calorie malnutrition: Secondary | ICD-10-CM | POA: Insufficient documentation

## 2022-06-29 DIAGNOSIS — I10 Essential (primary) hypertension: Secondary | ICD-10-CM | POA: Diagnosis present

## 2022-06-29 DIAGNOSIS — R0902 Hypoxemia: Secondary | ICD-10-CM | POA: Diagnosis present

## 2022-06-29 DIAGNOSIS — R55 Syncope and collapse: Secondary | ICD-10-CM | POA: Diagnosis present

## 2022-06-29 DIAGNOSIS — K7010 Alcoholic hepatitis without ascites: Secondary | ICD-10-CM | POA: Diagnosis not present

## 2022-06-29 DIAGNOSIS — R112 Nausea with vomiting, unspecified: Secondary | ICD-10-CM

## 2022-06-29 DIAGNOSIS — E872 Acidosis, unspecified: Secondary | ICD-10-CM | POA: Insufficient documentation

## 2022-06-29 DIAGNOSIS — F419 Anxiety disorder, unspecified: Secondary | ICD-10-CM | POA: Diagnosis present

## 2022-06-29 DIAGNOSIS — F909 Attention-deficit hyperactivity disorder, unspecified type: Secondary | ICD-10-CM | POA: Diagnosis present

## 2022-06-29 DIAGNOSIS — Z8249 Family history of ischemic heart disease and other diseases of the circulatory system: Secondary | ICD-10-CM

## 2022-06-29 DIAGNOSIS — F10288 Alcohol dependence with other alcohol-induced disorder: Secondary | ICD-10-CM

## 2022-06-29 DIAGNOSIS — Z681 Body mass index (BMI) 19 or less, adult: Secondary | ICD-10-CM

## 2022-06-29 DIAGNOSIS — I959 Hypotension, unspecified: Secondary | ICD-10-CM | POA: Diagnosis present

## 2022-06-29 DIAGNOSIS — E860 Dehydration: Secondary | ICD-10-CM | POA: Insufficient documentation

## 2022-06-29 DIAGNOSIS — K209 Esophagitis, unspecified without bleeding: Secondary | ICD-10-CM | POA: Diagnosis present

## 2022-06-29 DIAGNOSIS — K760 Fatty (change of) liver, not elsewhere classified: Secondary | ICD-10-CM | POA: Diagnosis present

## 2022-06-29 DIAGNOSIS — K222 Esophageal obstruction: Secondary | ICD-10-CM | POA: Diagnosis present

## 2022-06-29 DIAGNOSIS — N179 Acute kidney failure, unspecified: Secondary | ICD-10-CM | POA: Insufficient documentation

## 2022-06-29 DIAGNOSIS — R1314 Dysphagia, pharyngoesophageal phase: Secondary | ICD-10-CM | POA: Diagnosis present

## 2022-06-29 DIAGNOSIS — F10939 Alcohol use, unspecified with withdrawal, unspecified: Secondary | ICD-10-CM | POA: Diagnosis present

## 2022-06-29 DIAGNOSIS — E739 Lactose intolerance, unspecified: Secondary | ICD-10-CM | POA: Diagnosis present

## 2022-06-29 DIAGNOSIS — R569 Unspecified convulsions: Secondary | ICD-10-CM | POA: Diagnosis present

## 2022-06-29 DIAGNOSIS — R1319 Other dysphagia: Secondary | ICD-10-CM

## 2022-06-29 DIAGNOSIS — Z8673 Personal history of transient ischemic attack (TIA), and cerebral infarction without residual deficits: Secondary | ICD-10-CM

## 2022-06-29 DIAGNOSIS — Z79899 Other long term (current) drug therapy: Secondary | ICD-10-CM

## 2022-06-29 DIAGNOSIS — F1020 Alcohol dependence, uncomplicated: Secondary | ICD-10-CM | POA: Diagnosis present

## 2022-06-29 DIAGNOSIS — Z7982 Long term (current) use of aspirin: Secondary | ICD-10-CM

## 2022-06-29 LAB — COMPREHENSIVE METABOLIC PANEL
ALT: 122 U/L — ABNORMAL HIGH (ref 0–44)
AST: 282 U/L — ABNORMAL HIGH (ref 15–41)
Albumin: 4.4 g/dL (ref 3.5–5.0)
Alkaline Phosphatase: 71 U/L (ref 38–126)
Anion gap: 22 — ABNORMAL HIGH (ref 5–15)
BUN: 10 mg/dL (ref 6–20)
CO2: 22 mmol/L (ref 22–32)
Calcium: 8.9 mg/dL (ref 8.9–10.3)
Chloride: 89 mmol/L — ABNORMAL LOW (ref 98–111)
Creatinine, Ser: 1.28 mg/dL — ABNORMAL HIGH (ref 0.61–1.24)
GFR, Estimated: >60 mL/min (ref >60)
Glucose, Bld: 116 mg/dL — ABNORMAL HIGH (ref 70–99)
Potassium: 3.6 mmol/L (ref 3.5–5.1)
Sodium: 133 mmol/L — ABNORMAL LOW (ref 135–145)
Total Bilirubin: 2 mg/dL — ABNORMAL HIGH (ref 0.3–1.2)
Total Protein: 8 g/dL (ref 6.5–8.1)

## 2022-06-29 LAB — CBC
HCT: 40.8 % (ref 39.0–52.0)
Hemoglobin: 15.2 g/dL (ref 13.0–17.0)
MCH: 37 pg — ABNORMAL HIGH (ref 26.0–34.0)
MCHC: 37.3 g/dL — ABNORMAL HIGH (ref 30.0–36.0)
MCV: 99.3 fL (ref 80.0–100.0)
Platelets: 228 10*3/uL (ref 150–400)
RBC: 4.11 MIL/uL — ABNORMAL LOW (ref 4.22–5.81)
RDW: 12.1 % (ref 11.5–15.5)
WBC: 9.9 10*3/uL (ref 4.0–10.5)
nRBC: 0 % (ref 0.0–0.2)

## 2022-06-29 LAB — LACTIC ACID, PLASMA
Lactic Acid, Venous: 4.8 mmol/L (ref 0.5–1.9)
Lactic Acid, Venous: 3.7 mmol/L (ref 0.5–1.9)

## 2022-06-29 LAB — LIPASE, BLOOD: Lipase: 40 U/L (ref 11–51)

## 2022-06-29 MED ORDER — SODIUM CHLORIDE 0.9 % IV SOLN: INTRAVENOUS | Status: DC

## 2022-06-29 MED ORDER — ADULT MULTIVITAMIN W/MINERALS CH
1.0000 | ORAL_TABLET | Freq: Every day | ORAL | Status: DC
  Administered 2022-06-29 – 2022-07-03 (×5): 1 via ORAL
  Filled 2022-06-29 (×6): qty 1

## 2022-06-29 MED ORDER — FOLIC ACID 1 MG PO TABS
1.0000 mg | ORAL_TABLET | Freq: Every day | ORAL | Status: DC
  Administered 2022-06-29 – 2022-07-03 (×5): 1 mg via ORAL
  Filled 2022-06-29 (×6): qty 1

## 2022-06-29 MED ORDER — IOHEXOL 300 MG/ML  SOLN
100.0000 mL | Freq: Once | INTRAMUSCULAR | Status: AC | PRN
  Administered 2022-06-29: 100 mL via INTRAVENOUS

## 2022-06-29 MED ORDER — THIAMINE HCL 100 MG/ML IJ SOLN
100.0000 mg | Freq: Every day | INTRAMUSCULAR | Status: DC
  Filled 2022-06-29: qty 2

## 2022-06-29 MED ORDER — SODIUM CHLORIDE 0.9 % IV BOLUS
1000.0000 mL | Freq: Once | INTRAVENOUS | Status: AC
  Administered 2022-06-29: 1000 mL via INTRAVENOUS

## 2022-06-29 MED ORDER — LORAZEPAM 2 MG/ML IJ SOLN
1.0000 mg | INTRAMUSCULAR | Status: AC | PRN
  Administered 2022-06-29: 1 mg via INTRAVENOUS
  Filled 2022-06-29: qty 1

## 2022-06-29 MED ORDER — LORAZEPAM 1 MG PO TABS
1.0000 mg | ORAL_TABLET | ORAL | Status: AC | PRN
  Administered 2022-06-30: 1 mg via ORAL
  Administered 2022-06-30: 2 mg via ORAL
  Administered 2022-06-30: 1 mg via ORAL
  Filled 2022-06-29: qty 2
  Filled 2022-06-29 (×2): qty 1

## 2022-06-29 MED ORDER — THIAMINE MONONITRATE 100 MG PO TABS
100.0000 mg | ORAL_TABLET | Freq: Every day | ORAL | Status: DC
  Administered 2022-06-29 – 2022-07-03 (×5): 100 mg via ORAL
  Filled 2022-06-29 (×6): qty 1

## 2022-06-29 MED ORDER — ONDANSETRON HCL 4 MG/2ML IJ SOLN
4.0000 mg | Freq: Once | INTRAMUSCULAR | Status: AC
  Administered 2022-06-29: 4 mg via INTRAVENOUS
  Filled 2022-06-29: qty 2

## 2022-06-29 NOTE — Assessment & Plan Note (Signed)
-   Patient had a syncopal episode after nausea and vomiting - Likely be able - With a history of alcohol dependence and recent abstinence from alcohol, continue seizure precautions - Check echo in the a.m. for completeness - Continue to monitor

## 2022-06-29 NOTE — Assessment & Plan Note (Signed)
-   Transaminitis with an AST of 282 and ALT 122 - T. bili 2.0 - Ultrasound shows hepatic steatosis without focal liver lesions - Likely alcoholic hepatitis given hypoxia patient drinks - Lipase is 40 - CT abdomen pelvis shows no acute findings but marked hepatic steatosis - Acute hepatitis panel for completeness - Hold hepatotoxic agents when possible - Continue to monitor

## 2022-06-29 NOTE — Assessment & Plan Note (Signed)
-   Hold p.o. intake and GI losses - Associated AKI and hypotension - Lactic acid 4.8 after 1 L improved to 3.7 - Aggressive IV hydration - Continue to monitor

## 2022-06-29 NOTE — Assessment & Plan Note (Signed)
-   Lactic acidosis 4.8 - After 1 L 3.7 - Continue hydration - Secondary to dehydration

## 2022-06-29 NOTE — ED Notes (Signed)
Rounded on patient. Patient awake and alert in bed, denies needs at this time. NAD.

## 2022-06-29 NOTE — ED Triage Notes (Signed)
Patient to ED via POV for vomiting and not eating x4 days. Generalized weakness and not feeling well. While triage patient he stated he was going to pass out and became unresponsive with a gaze to the left. Per patient, hx of seizures. Patient pale and diaphoretic.

## 2022-06-29 NOTE — H&P (Signed)
History and Physical    Patient: Levi Mejia ZOX:[id number] DOB: 06-26-1970 DOA: 06/29/2022 DOS: the patient was seen and examined on 06/29/2022 PCP: Dana Allan, MD  Patient coming from: Home  Chief Complaint:  Chief Complaint  Patient presents with   Emesis   HPI: Nesta Kimple is a 52 y.o. male with medical history significant of alcohol abuse, ADHD, HTN, presents to the ED with a chief complaint of nausea and vomiting.  Patient reports for 4 days.  He reports he has been esophageal stretching for the first time in the near future.  He reports that at first he cannot keep solids down.  Anything he would come right back up.  He reports his thinking is happening with water now.  Despite this, he has been able to drink half a 5th of alcohol every day.  Patient reports he is abdominal pains in the stomach that come and go.  He describes them as knots.  His last bowel movement was this morning.  It was normal for him.  He reports that he has had a several episodes of emesis per day.  Light yellow fluid and burn Coming up.  He has not had any hematemesis.  Patient reports he has had tremulousness and an alcoholic withdrawal seizure in the past.  Last time he tried to quit was 1 year ago.  This seizure occurred on the second day.  He reports he already feels tremulous this time after not having drink for 8 or 9 hours.  Patient does not smoke.  He does drink.  His last marijuana use was 1 month ago.  He is vaccinated for COVID he is not for flu.  Patient is full code. Review of Systems: As mentioned in the history of present illness. All other systems reviewed and are negative. Past Medical History:  Diagnosis Date   Alcohol abuse    Attention deficit disorder without mention of hyperactivity    HTN (hypertension)    ICH (intracerebral hemorrhage) (HCC)    Seizures (HCC)    Stroke [facility])    Past Surgical History:  Procedure Laterality Date   Hand tendon reattachment   02/28/1990   Right hand   toe reattachment  02/28/1994   Right 1st toe   Social History:  reports that he has never smoked. He has never used smokeless tobacco. He reports current alcohol use. He reports that he does not currently use drugs after having used the following drugs: Marijuana.  No Known Allergies  Family History  Problem Relation Age of Onset   Heart attack Father 82   Hypertension Father    Coronary artery disease Mother 32       Stent   Cancer Mother        Bone   Hypertension Mother    Hypertension Brother    Diabetes Neg Hx    Prostate cancer Neg Hx    Colon cancer Neg Hx    Heart disease Neg Hx     Prior to Admission medications   Medication Sig Start Date End Date Taking? Authorizing Provider  lisinopril (ZESTRIL) 5 MG tablet Take 5 mg by mouth daily.   Yes [provider]  aspirin EC 81 MG tablet Take 1 tablet (81 mg total) by mouth daily. 04/08/22   Sunnie Nielsen, DO  ibuprofen (ADVIL) 200 MG tablet Take 2 tablets (400 mg total) by mouth every 6 (six) hours as needed for fever, headache or mild pain. 04/08/22   Sunnie Nielsen,  DO  losartan (COZAAR) 25 MG tablet Take 0.5 tablets (12.5 mg total) by mouth daily. 06/23/22   Dana Allan, MD  Multiple Vitamin (MULTIVITAMIN WITH MINERALS) TABS tablet Take 1 tablet by mouth daily. 04/09/22   Sunnie Nielsen, DO    Physical Exam: Vitals:   06/29/22 1845 06/29/22 1850 06/29/22 2100 06/29/22 2127  BP: 98/73  101/77   Pulse: 66 86 76   Resp: 16 15 10    Temp:   98.7 F (37.1 C)   TempSrc:   Oral   SpO2: 95% 92% 97%   Weight:    70.3 kg  Height:    5\' 11"  (1.803 m)   1.  General: Patient lying supine in bed,  no acute distress   2. Psychiatric: Alert and oriented x 3, mood and behavior normal for situation, pleasant and cooperative with exam   3. Neurologic: Speech and language are normal, face is symmetric, moves all 4 extremities voluntarily, at baseline without acute deficits on limited  exam   4. HEENMT:  Head is atraumatic, normocephalic, pupils reactive to light, neck is supple, trachea is midline, mucous membranes are moist   5. Respiratory : Lungs are clear to auscultation bilaterally without wheezing, rhonchi, rales, no cyanosis, no increase in work of breathing or accessory muscle use   6. Cardiovascular : Heart rate tachycardic, rhythm is regular, no murmurs, rubs or gallops, no peripheral edema, peripheral pulses palpated   7. Gastrointestinal:  Abdomen is soft, nondistended, nontender to palpation bowel sounds active, no masses or organomegaly palpated   8. Skin:  Skin is warm, dry and intact without rashes, acute lesions, or ulcers on limited exam   9.Musculoskeletal:  No acute deformities or trauma, no asymmetry in tone, no peripheral edema, peripheral pulses palpated, no tenderness to palpation in the extremities  Data Reviewed: In the ED Temp 98, heart rate 66-90, respiratory rate 10-18, blood pressure 51/23-91/77, satting 92-97% No leukocytosis with white blood cell count of 9.9, hemoglobin 15.2 Chemistry reveals a bump in creatinine at 1.28 AST 282, ALT is 122 T. bili 2.0 Lactic acid 4.8 After 1 L 3.7 CT of the pelvis shows no acute findings but does show marked hepatic steatosis Right upper quadrant ultrasound also shows hepatic steatosis without focal liver lesions Admission was requested for dehydration secondary to GI losses which are likely triggered by alcoholic hepatitis  Assessment and Plan: * Alcoholic hepatitis - Transaminitis with an AST of 282 and ALT 122 - T. bili 2.0 - Ultrasound shows hepatic steatosis without focal liver lesions - Likely alcoholic hepatitis given hypoxia patient drinks - Lipase is 40 - CT abdomen pelvis shows no acute findings but marked hepatic steatosis - Acute hepatitis panel for completeness - Hold hepatotoxic agents when possible - Continue to monitor  Alcohol dependence (HCC) - Drinks half of a 5th  daily - Had alcoholic seizure last year when trying to quit - Has not had a drink in about 9 hours and already feels tremulous - CIWA protocol - Counseled on importance of cessation - Continue to monitor  Dehydration - Hold p.o. intake and GI losses - Associated AKI and hypotension - Lactic acid 4.8 after 1 L improved to 3.7 - Aggressive IV hydration - Continue to monitor  Lactic acidosis - Lactic acidosis 4.8 - After 1 L 3.7 - Continue hydration - Secondary to dehydration  AKI (acute kidney injury) (HCC) - Creatinine increased from 0.77>> 1.28 - Secondary to poor p.o. intake and GI losses - Hold ACE/ARB -  Hold NSAIDs and other nephrotoxic agents when possible - 1 L bolus given in the ED - Continue aggressive IV hydration - Trend in a.m.      Advance Care Planning:   Code Status: Full Code   Consults: none at this time.   Family Communication: No family at bedside  Severity of Illness: The appropriate patient status for this patient is OBSERVATION. Observation status is judged to be reasonable and necessary in order to provide the required intensity of service to ensure the patient's safety. The patient's presenting symptoms, physical exam findings, and initial radiographic and laboratory data in the context of their medical condition is felt to place them at decreased risk for further clinical deterioration. Furthermore, it is anticipated that the patient will be medically stable for discharge from the hospital within 2 midnights of admission.   Author: Lilyan Gilford, DO 06/29/2022 10:31 PM  For on call review [email or web address].

## 2022-06-29 NOTE — ED Provider Notes (Signed)
[facility] Provider Note   Event Date/Time   First MD Initiated Contact with Patient 06/29/22 1859     (approximate) History  Emesis  HPI Levi Mejia is a 52 y.o. male with a stated past medical history of heavy alcohol abuse who presents complaining of vomiting throughout the day as well as lightheadedness.  Patient was brought back to the emergently to one of our rooms secondary to a syncopal event that occurred in the waiting room after vomiting.  Patient had 1 further episode of cough syncope while in the room.  Patient states that he has been feeling poorly over the last 24 hours after a heavy few days of drinking.  Patient endorses drinking at least a half a bottle of hard liquor a day with the last drink yesterday. ROS: Patient currently denies any vision changes, tinnitus, difficulty speaking, facial droop, sore throat, chest pain, shortness of breath, diarrhea, dysuria, or weakness/numbness/paresthesias in any extremity   Physical Exam  Triage Vital Signs: ED Triage Vitals  Enc Vitals Group     BP 06/29/22 1842 (!) 51/23     Pulse Rate 06/29/22 1842 90     Resp 06/29/22 1842 18     Temp 06/29/22 1842 98 F (36.7 C)     Temp Source 06/29/22 1842 Oral     SpO2 06/29/22 1842 97 %     Weight --      Height --      Head Circumference --      Peak Flow --      Pain Score 06/29/22 1842 0     Pain Loc --      Pain Edu? --      Excl. in GC? --    Most recent vital signs: Vitals:   06/29/22 2100 06/29/22 2300  BP: 101/77 110/81  Pulse: 76 84  Resp: 10   Temp: 98.7 F (37.1 C)   SpO2: 97%    General: Awake, oriented x4. CV:  Good peripheral perfusion.  Resp:  Normal effort.  Abd:  No distention.  Mild right upper quadrant and midepigastric tenderness to palpation Other:  Middle-aged overweight Caucasian male laying in bed in no acute distress.  Mildly tremulous ED Results / Procedures / Treatments  Labs (all labs ordered are  listed, but only abnormal results are displayed) Labs Reviewed  COMPREHENSIVE METABOLIC PANEL - Abnormal; Notable for the following components:      Result Value   Sodium 133 (*)    Chloride 89 (*)    Glucose, Bld 116 (*)    Creatinine, Ser 1.28 (*)    AST 282 (*)    ALT 122 (*)    Total Bilirubin 2.0 (*)    Anion gap 22 (*)    All other components within normal limits  CBC - Abnormal; Notable for the following components:   RBC 4.11 (*)    MCH 37.0 (*)    MCHC 37.3 (*)    All other components within normal limits  LACTIC ACID, PLASMA - Abnormal; Notable for the following components:   Lactic Acid, Venous 4.8 (*)    All other components within normal limits  LACTIC ACID, PLASMA - Abnormal; Notable for the following components:   Lactic Acid, Venous 3.7 (*)    All other components within normal limits  LIPASE, BLOOD  HEPATITIS PANEL, ACUTE  LACTIC ACID, PLASMA  LACTIC ACID, PLASMA  LACTIC ACID, PLASMA   EKG ED ECG REPORT I, Clayburn Pert  Hilario Quarry, the attending physician, personally viewed and interpreted this ECG. Date: 06/29/2022 EKG Time: 1843 Rate: 70 Rhythm: normal sinus rhythm QRS Axis: normal Intervals: normal ST/T Wave abnormalities: normal Narrative Interpretation: no evidence of acute ischemia RADIOLOGY ED MD interpretation: Ultrasound of the right upper quadrant of the abdomen interpreted independently by me and shows hepatic steatosis without focal liver lesions  CT of the abdomen and pelvis with IV contrast interpreted independently by me shows no acute findings with marked hepatic steatosis and colonic diverticulosis -Agree with radiology assessment Official radiology report(s): US Abdomen Limited RUQ (LIVER/GB)  Result Date: 06/29/2022 CLINICAL DATA:  Transaminitis. EXAM: ULTRASOUND ABDOMEN LIMITED RIGHT UPPER QUADRANT COMPARISON:  April 07, 2022 FINDINGS: Gallbladder: No gallstones or wall thickening visualized (1.7 mm). No sonographic Murphy sign noted by  sonographer. Common bile duct: Diameter: 2.0 mm Liver: No focal lesion identified. Diffusely increased echogenicity of the liver parenchyma is noted. This is seen on the prior exam. Portal vein is patent on color Doppler imaging with normal direction of blood flow towards the liver. Other: None. IMPRESSION: Hepatic steatosis without focal liver lesions. Electronically Signed   By: Aram Candela M.D.   On: 06/29/2022 20:56   CT ABDOMEN PELVIS W CONTRAST  Result Date: 06/29/2022 CLINICAL DATA:  Generalized weakness. Abdominal pain. Unresponsiveness. EXAM: CT ABDOMEN AND PELVIS WITH CONTRAST TECHNIQUE: Multidetector CT imaging of the abdomen and pelvis was performed using the standard protocol following bolus administration of intravenous contrast. RADIATION DOSE REDUCTION: This exam was performed according to the departmental dose-optimization program which includes automated exposure control, adjustment of the mA and/or kV according to patient size and/or use of iterative reconstruction technique. CONTRAST:  OMNIPAQUE IOHEXOL 300 MG/ML  SOLN COMPARISON:  Abdominal ultrasound 04/07/2022 and CT abdomen and pelvis 03/25/2014 FINDINGS: Lower chest: No acute abnormality. Hepatobiliary: Marked hepatic steatosis. Unremarkable gallbladder and biliary tree. Pancreas: Unremarkable. Spleen: Unremarkable. Adrenals/Urinary Tract: Normal adrenal glands. No urinary calculi or hydronephrosis. Unremarkable bladder. Stomach/Bowel: Colonic diverticulosis without diverticulitis. Diffuse fatty infiltration of the colon MVA normal finding or due to sequela of chronic inflammation. No evidence of active inflammation. Normal appendix. Stomach is within normal limits. Vascular/Lymphatic: Mild aortic atherosclerotic calcification. No lymphadenopathy. Reproductive: Unremarkable. Other: No free intraperitoneal fluid or air. Musculoskeletal: No acute fracture. IMPRESSION: 1. No acute findings. 2. Marked hepatic steatosis. 3. Colonic  diverticulosis. Aortic Atherosclerosis (ICD10-I70.0). Electronically Signed   By: Minerva Fester M.D.   On: 06/29/2022 20:15   PROCEDURES: Critical Care performed: Yes, see critical care procedure note(s) .1-3 Lead EKG Interpretation  Performed by: Merwyn Katos, MD Authorized by: Merwyn Katos, MD     Interpretation: normal     ECG rate:  71   ECG rate assessment: normal     Rhythm: sinus rhythm     Ectopy: none     Conduction: normal   CRITICAL CARE Performed by: Merwyn Katos  Total critical care time: 31 minutes  Critical care time was exclusive of separately billable procedures and treating other patients.  Critical care was necessary to treat or prevent imminent or life-threatening deterioration.  Critical care was time spent personally by me on the following activities: development of treatment plan with patient and/or surrogate as well as nursing, discussions with consultants, evaluation of patient's response to treatment, examination of patient, obtaining history from patient or surrogate, ordering and performing treatments and interventions, ordering and review of laboratory studies, ordering and review of radiographic studies, pulse oximetry and re-evaluation of patient's condition.  MEDICATIONS ORDERED IN ED: Medications  LORazepam (ATIVAN) tablet 1-4 mg ( Oral See Alternative 06/29/22 2133)    Or  LORazepam (ATIVAN) injection 1-4 mg (1 mg Intravenous Given 06/29/22 2133)  thiamine (VITAMIN B1) tablet 100 mg (100 mg Oral Given 06/29/22 2134)    Or  thiamine (VITAMIN B1) injection 100 mg ( Intravenous See Alternative 06/29/22 2134)  folic acid (FOLVITE) tablet 1 mg (1 mg Oral Given 06/29/22 2134)  multivitamin with minerals tablet 1 tablet (1 tablet Oral Given 06/29/22 2133)  0.9 %  sodium chloride infusion ( Intravenous New Bag/Given 06/29/22 2250)  sodium chloride 0.9 % bolus 1,000 mL (0 mLs Intravenous Stopped 06/29/22 1945)  ondansetron (ZOFRAN) injection 4 mg (4 mg  Intravenous Given 06/29/22 1914)  iohexol (OMNIPAQUE) 300 MG/ML solution 100 mL (100 mLs Intravenous Contrast Given 06/29/22 2001)   IMPRESSION / MDM / ASSESSMENT AND PLAN / ED COURSE  I reviewed the triage vital signs and the nursing notes.                             The patient is on the cardiac monitor to evaluate for evidence of arrhythmia and/or significant heart rate changes. Patient's presentation is most consistent with acute presentation with potential threat to life or bodily function.  This patient presents to the ED for concern of vomiting and syncope, this involves an extensive number of treatment options, and is a complaint that carries with it a high risk of complications and morbidity.  The differential diagnosis includes dehydration, gastroenteritis, small bowel obstruction, alcohol withdrawal Co morbidities that complicate the patient evaluation  Call abuse Additional history obtained:  External records from outside source obtained and reviewed including recent admission on 04/07/2022 Lab Tests:  I Ordered, and personally interpreted labs.  The pertinent results include:  Sodium 133, chloride 89, glucose 116, creatinine 1.28, AST/ALT 282/122, bilirubin 2.0, anion gap 22, lactic acidosis 4.8 Imaging Studies ordered:  I ordered imaging studies including CT of the abdomen and pelvis and right upper quadrant ultrasound  I independently visualized and interpreted imaging which showed hepatic steatosis  I agree with the radiologist interpretation Cardiac Monitoring: / EKG:  The patient was maintained on a cardiac monitor.  I personally viewed and interpreted the cardiac monitored which showed an underlying rhythm of: Normal sinus rhythm Consultations Obtained:  I requested consultation with the hospitalist service,  and discussed lab and imaging findings as well as pertinent plan - they recommend: Admission Problem List / ED Course / Critical interventions / Medication  management  Alcoholic hepatitis, AKI  I ordered medication including Ativan per CIWA protocol for alcohol withdrawal  Reevaluation of the patient after these medicines showed that the patient improved  I have reviewed the patients home medicines and have made adjustments as needed Dispo: Admit to medicine     FINAL CLINICAL IMPRESSION(S) / ED DIAGNOSES   Final diagnoses:  Alcoholic hepatitis, unspecified whether ascites present  Nausea and vomiting, unspecified vomiting type   Rx / DC Orders   ED Discharge Orders     None      Note:  This document was prepared using Dragon voice recognition software and may include unintentional dictation errors.   Merwyn Katos, MD 06/29/22 2312

## 2022-06-29 NOTE — ED Notes (Signed)
Patient became completely unresponsive while in triage. Patient placed on stretcher and taken to room 8. Vicente Males, MD and multiple RN's at bedside.

## 2022-06-29 NOTE — Assessment & Plan Note (Signed)
-   Drinks half of a 5th daily - Had alcoholic seizure last year when trying to quit - Has not had a drink in about 9 hours and already feels tremulous - CIWA protocol - Counseled on importance of cessation - Continue to monitor

## 2022-06-29 NOTE — Assessment & Plan Note (Signed)
-   Creatinine increased from 0.77>> 1.28 - Secondary to poor p.o. intake and GI losses - Hold ACE/ARB - Hold NSAIDs and other nephrotoxic agents when possible - 1 L bolus given in the ED - Continue aggressive IV hydration - Trend in a.m.

## 2022-06-30 ENCOUNTER — Observation Stay (HOSPITAL_COMMUNITY)
Admit: 2022-06-30 | Discharge: 2022-06-30 | Disposition: A | Discharge status: Home / Self Care | Payer: PRIVATE HEALTH INSURANCE | Attending: Family Medicine | Admitting: Family Medicine

## 2022-06-30 DIAGNOSIS — R1314 Dysphagia, pharyngoesophageal phase: Secondary | ICD-10-CM | POA: Diagnosis present

## 2022-06-30 DIAGNOSIS — N179 Acute kidney failure, unspecified: Secondary | ICD-10-CM | POA: Diagnosis not present

## 2022-06-30 DIAGNOSIS — F10939 Alcohol use, unspecified with withdrawal, unspecified: Secondary | ICD-10-CM | POA: Diagnosis present

## 2022-06-30 DIAGNOSIS — E43 Unspecified severe protein-calorie malnutrition: Secondary | ICD-10-CM | POA: Diagnosis present

## 2022-06-30 DIAGNOSIS — F419 Anxiety disorder, unspecified: Secondary | ICD-10-CM | POA: Diagnosis present

## 2022-06-30 DIAGNOSIS — R1319 Other dysphagia: Secondary | ICD-10-CM | POA: Diagnosis not present

## 2022-06-30 DIAGNOSIS — F10288 Alcohol dependence with other alcohol-induced disorder: Secondary | ICD-10-CM | POA: Diagnosis not present

## 2022-06-30 DIAGNOSIS — Z681 Body mass index (BMI) 19 or less, adult: Secondary | ICD-10-CM | POA: Diagnosis not present

## 2022-06-30 DIAGNOSIS — R569 Unspecified convulsions: Secondary | ICD-10-CM | POA: Diagnosis present

## 2022-06-30 DIAGNOSIS — K209 Esophagitis, unspecified without bleeding: Secondary | ICD-10-CM | POA: Diagnosis present

## 2022-06-30 DIAGNOSIS — K7010 Alcoholic hepatitis without ascites: Secondary | ICD-10-CM | POA: Diagnosis not present

## 2022-06-30 DIAGNOSIS — Z8673 Personal history of transient ischemic attack (TIA), and cerebral infarction without residual deficits: Secondary | ICD-10-CM | POA: Diagnosis not present

## 2022-06-30 DIAGNOSIS — K760 Fatty (change of) liver, not elsewhere classified: Secondary | ICD-10-CM | POA: Diagnosis present

## 2022-06-30 DIAGNOSIS — I10 Essential (primary) hypertension: Secondary | ICD-10-CM | POA: Diagnosis present

## 2022-06-30 DIAGNOSIS — E785 Hyperlipidemia, unspecified: Secondary | ICD-10-CM | POA: Diagnosis present

## 2022-06-30 DIAGNOSIS — R55 Syncope and collapse: Secondary | ICD-10-CM | POA: Diagnosis present

## 2022-06-30 DIAGNOSIS — F909 Attention-deficit hyperactivity disorder, unspecified type: Secondary | ICD-10-CM | POA: Diagnosis present

## 2022-06-30 DIAGNOSIS — Z7982 Long term (current) use of aspirin: Secondary | ICD-10-CM | POA: Diagnosis not present

## 2022-06-30 DIAGNOSIS — R0902 Hypoxemia: Secondary | ICD-10-CM | POA: Diagnosis present

## 2022-06-30 DIAGNOSIS — Z8249 Family history of ischemic heart disease and other diseases of the circulatory system: Secondary | ICD-10-CM | POA: Diagnosis not present

## 2022-06-30 DIAGNOSIS — E739 Lactose intolerance, unspecified: Secondary | ICD-10-CM | POA: Diagnosis present

## 2022-06-30 DIAGNOSIS — R112 Nausea with vomiting, unspecified: Secondary | ICD-10-CM | POA: Diagnosis present

## 2022-06-30 DIAGNOSIS — E872 Acidosis, unspecified: Secondary | ICD-10-CM | POA: Diagnosis present

## 2022-06-30 DIAGNOSIS — K222 Esophageal obstruction: Secondary | ICD-10-CM | POA: Diagnosis present

## 2022-06-30 DIAGNOSIS — I959 Hypotension, unspecified: Secondary | ICD-10-CM | POA: Diagnosis present

## 2022-06-30 DIAGNOSIS — F10231 Alcohol dependence with withdrawal delirium: Secondary | ICD-10-CM | POA: Diagnosis present

## 2022-06-30 DIAGNOSIS — E860 Dehydration: Secondary | ICD-10-CM | POA: Diagnosis not present

## 2022-06-30 LAB — ECHOCARDIOGRAM COMPLETE
AR max vel: 2.62 cm2
AV Area VTI: 2.95 cm2
AV Area mean vel: 2.3 cm2
AV Mean grad: 3 mmHg
AV Peak grad: 4.8 mmHg
Ao pk vel: 1.1 m/s
Area-P 1/2: 4.49 cm2
Height: 71 in
S' Lateral: 2.7 cm
Weight: 2480 oz

## 2022-06-30 LAB — CBC WITH DIFFERENTIAL/PLATELET
Abs Immature Granulocytes: 0.02 10*3/uL (ref 0.00–0.07)
Basophils Absolute: 0.1 10*3/uL (ref 0.0–0.1)
Basophils Relative: 2 %
Eosinophils Absolute: 0.1 10*3/uL (ref 0.0–0.5)
Eosinophils Relative: 2 %
HCT: 34 % — ABNORMAL LOW (ref 39.0–52.0)
Hemoglobin: 12.2 g/dL — ABNORMAL LOW (ref 13.0–17.0)
Immature Granulocytes: 0 %
Lymphocytes Relative: 27 %
Lymphs Abs: 1.7 10*3/uL (ref 0.7–4.0)
MCH: 36.5 pg — ABNORMAL HIGH (ref 26.0–34.0)
MCHC: 35.9 g/dL (ref 30.0–36.0)
MCV: 101.8 fL — ABNORMAL HIGH (ref 80.0–100.0)
Monocytes Absolute: 0.7 10*3/uL (ref 0.1–1.0)
Monocytes Relative: 12 %
Neutro Abs: 3.5 10*3/uL (ref 1.7–7.7)
Neutrophils Relative %: 57 %
Platelets: 126 10*3/uL — ABNORMAL LOW (ref 150–400)
RBC: 3.34 MIL/uL — ABNORMAL LOW (ref 4.22–5.81)
RDW: 12.3 % (ref 11.5–15.5)
WBC: 6.1 10*3/uL (ref 4.0–10.5)
nRBC: 0 % (ref 0.0–0.2)

## 2022-06-30 LAB — COMPREHENSIVE METABOLIC PANEL
ALT: 93 U/L — ABNORMAL HIGH (ref 0–44)
AST: 196 U/L — ABNORMAL HIGH (ref 15–41)
Albumin: 3.6 g/dL (ref 3.5–5.0)
Alkaline Phosphatase: 54 U/L (ref 38–126)
Anion gap: 12 (ref 5–15)
BUN: 10 mg/dL (ref 6–20)
CO2: 27 mmol/L (ref 22–32)
Calcium: 8 mg/dL — ABNORMAL LOW (ref 8.9–10.3)
Chloride: 93 mmol/L — ABNORMAL LOW (ref 98–111)
Creatinine, Ser: 0.88 mg/dL (ref 0.61–1.24)
GFR, Estimated: >60 mL/min (ref >60)
Glucose, Bld: 82 mg/dL (ref 70–99)
Potassium: 3.7 mmol/L (ref 3.5–5.1)
Sodium: 132 mmol/L — ABNORMAL LOW (ref 135–145)
Total Bilirubin: 2.2 mg/dL — ABNORMAL HIGH (ref 0.3–1.2)
Total Protein: 6.4 g/dL — ABNORMAL LOW (ref 6.5–8.1)

## 2022-06-30 LAB — HEPATITIS PANEL, ACUTE
HCV Ab: NONREACTIVE
Hep A IgM: NONREACTIVE
Hep B C IgM: NONREACTIVE
Hepatitis B Surface Ag: NONREACTIVE

## 2022-06-30 LAB — LACTIC ACID, PLASMA
Lactic Acid, Venous: 0.9 mmol/L (ref 0.5–1.9)
Lactic Acid, Venous: 1.1 mmol/L (ref 0.5–1.9)
Lactic Acid, Venous: 2.8 mmol/L (ref 0.5–1.9)

## 2022-06-30 LAB — MAGNESIUM: Magnesium: 1.2 mg/dL — ABNORMAL LOW (ref 1.7–2.4)

## 2022-06-30 MED ORDER — ONDANSETRON HCL 4 MG/2ML IJ SOLN: 4.0000 mg | Freq: Four times a day (QID) | INTRAMUSCULAR | Status: DC | PRN

## 2022-06-30 MED ORDER — ENSURE ENLIVE PO LIQD
237.0000 mL | Freq: Three times a day (TID) | ORAL | Status: DC
  Administered 2022-07-01 – 2022-07-03 (×8): 237 mL via ORAL

## 2022-06-30 MED ORDER — MAGNESIUM SULFATE 4 GM/100ML IV SOLN
4.0000 g | Freq: Once | INTRAVENOUS | Status: AC
  Administered 2022-06-30: 4 g via INTRAVENOUS
  Filled 2022-06-30: qty 100

## 2022-06-30 MED ORDER — HEPARIN SODIUM (PORCINE) 5000 UNIT/ML IJ SOLN
5000.0000 [IU] | Freq: Three times a day (TID) | INTRAMUSCULAR | Status: DC
  Administered 2022-06-30: 5000 [IU] via SUBCUTANEOUS
  Filled 2022-06-30: qty 1

## 2022-06-30 MED ORDER — DIAZEPAM 5 MG PO TABS
5.0000 mg | ORAL_TABLET | Freq: Four times a day (QID) | ORAL | Status: AC
  Administered 2022-06-30 – 2022-07-01 (×4): 5 mg via ORAL
  Filled 2022-06-30 (×4): qty 1

## 2022-06-30 MED ORDER — OXYCODONE HCL 5 MG PO TABS: 5.0000 mg | ORAL_TABLET | ORAL | Status: DC | PRN

## 2022-06-30 MED ORDER — ENOXAPARIN SODIUM 40 MG/0.4ML IJ SOSY
40.0000 mg | PREFILLED_SYRINGE | INTRAMUSCULAR | Status: DC
  Administered 2022-06-30 – 2022-07-03 (×4): 40 mg via SUBCUTANEOUS
  Filled 2022-06-30 (×4): qty 0.4

## 2022-06-30 MED ORDER — ONDANSETRON HCL 4 MG PO TABS: 4.0000 mg | ORAL_TABLET | Freq: Four times a day (QID) | ORAL | Status: DC | PRN

## 2022-06-30 MED ORDER — ASPIRIN 81 MG PO TBEC
81.0000 mg | DELAYED_RELEASE_TABLET | Freq: Every day | ORAL | Status: DC
  Administered 2022-06-30 – 2022-07-03 (×4): 81 mg via ORAL
  Filled 2022-06-30 (×5): qty 1

## 2022-06-30 NOTE — ED Notes (Signed)
Patient transported off the floor with transport to inpatient bed at this time. Patient awake and alert, NAD at time of transport.

## 2022-06-30 NOTE — Progress Notes (Signed)
*  PRELIMINARY RESULTS* Echocardiogram 2D Echocardiogram has been performed.  Levi Mejia 06/30/2022, 10:51 AM

## 2022-06-30 NOTE — Hospital Course (Addendum)
Levi Mejia is a 52 y.o. male with medical history significant of alcohol abuse, ADHD, HTN, presents to the ED with a chief complaint of nausea and vomiting x4d. Dysphagia, cannot tolerate solids, scheduled for esophageal dilation. (+)EtOH use w/ hx seizure in the past. Last EtOH consumption 04/30, typical intake half a bottle of hard liquor a day  05/01: to ED, CT abdomen pelvis no acute findings but marked hepatic steatosis, elevated AST/ALT to 282/122 and Tbili 2.0 question alcoholic hepatitis. Admitted for AKI, CIWA protocol, IV fluids 05/02: Cr normalized, AST/ALT improved, lactate WNL. Pt is motivated to stop drinking, will keep inpatient for withdrawal monitoring. D/w GI, holding off on consult for now unless he is not able to tolerate full liquid diet. Valium 5 mg po q6h + prn breakthrough based on CIWA 05/03: AST/ALT increased. CIWA stable around 5 compared to 10 on admission. Valium 5 mg po q8h + prn breakthrough based on CIWA 05/04: low CIWA. Valium to q12h later today. If stable tomorrow, will be outside window for DT and can potentially discharge home unless GI planning on procedure. Per Dr Servando Snare (GI) "Monday afternoon then there is opening in my schedule for Monday afternoon to do a procedure on him as an inpatient if he is still here for other reasons otherwise he should follow-up with Dr. Allegra Lai on Tuesday." 05/05: stable, no tremor or anxiety, CIWA 0, await EGD tomorrow.   Consultants:  none  Procedures: none      ASSESSMENT & PLAN:   Principal Problem:   Alcoholic hepatitis Active Problems:   Alcohol dependence (HCC)   Syncope, vasovagal   AKI (acute kidney injury) (HCC)   Lactic acidosis   Dehydration   Alcohol withdrawal (HCC)   Protein-calorie malnutrition, severe   Alcohol dependence (HCC) Had alcoholic seizure last year when trying to quit Has not had a drink in about 9 hours and already feels tremulous CIWA protocol Scheduled benzodiazepine  taper Prn benzodiazepine for breakthrough symptoms   AKI (acute kidney injury) (HCC) - resolved  Dehydration - improved  Po fluids  Monitor BMP   Syncope, vasovagal Patient had a syncopal episode after nausea and vomiting With a history of alcohol dependence and recent abstinence from alcohol continue seizure precautions echo for completeness --> no concerns   Dysphagia to solids EGD planned for tomorrow afternoon w/ Dr Servando Snare Full liquid diet tolerated thus far   Alcoholic hepatitis Transaminitis  Hepatic steatosis without cirrhosis Acute hepatitis panel for completeness --> NR Hold hepatotoxic agents when possible Will need GI follow-up   Lactic acidosis - resolved Likely d/t dehydration Check as needed      DVT prophylaxis: lovenox  Pertinent IV fluids/nutrition: stopping IV fluids given improvement, encourage po as able Central lines / invasive devices: none  Code Status: FULL CODE ACP documentation reviewed: 06/30/22 none on file in [facility]  Current Admission Status: inpatient   TOC needs / Dispo plan: none at this time Barriers to discharge / significant pending items: EtOH withdrawal and AKI treatment, may be able to d/c tomorrow pending EGD

## 2022-06-30 NOTE — Progress Notes (Signed)
PROGRESS NOTE    [facility]   BJY:[id number] DOB: 1970-05-13  DOA: 06/29/2022 Date of Service: 06/30/22 PCP: Dana Allan, MD     Brief Narrative / Hospital Course:  Levi Mejia is a 52 y.o. male with medical history significant of alcohol abuse, ADHD, HTN, presents to the ED with a chief complaint of nausea and vomiting x4d. Dysphagia, cannot tolerate solids, scheduled for esophageal dilation. (+)EtOH use w/ hx seizure in the past 05/01: to ED, CT abdomen pelvis no acute findings but marked hepatic steatosis, elevated AST/ALT to 282/122 and Tbili 2.0 question alcoholic hepatitis. Admitted for AKI, CIWA protocol, IV fluids 05/02: Cr normalized, AST/ALT improved, lactate WNL. Pt is motivated to stop drinking, will keep inpatient for withdrawal monitoring. D/w GI, holding off on consult for now unless he is not able to tolerate full liquid diet   Consultants:  none  Procedures: none      ASSESSMENT & PLAN:   Principal Problem:   Alcoholic hepatitis Active Problems:   Alcohol dependence (HCC)   Syncope, vasovagal   AKI (acute kidney injury) (HCC)   Lactic acidosis   Dehydration   Alcohol dependence (HCC) Had alcoholic seizure last year when trying to quit Has not had a drink in about 9 hours and already feels tremulous CIWA protocol Scheduled benzodiazepine q6h today --> q8h tomorrow --> q12h day after if doing well   AKI (acute kidney injury) (HCC) - resolved  Dehydration - improved  Po fluids  Monitor BMP   Syncope, vasovagal Patient had a syncopal episode after nausea and vomiting With a history of alcohol dependence and recent abstinence from alcohol continue seizure precautions Check echo for completeness  Dysphagia to solids Spoke w/ GI, defer consult for now Consider reconsult when he is outside window for withdrawals Consider consult if unable to tolerate liquids  Full liquid diet   Alcoholic hepatitis Transaminitis -  improving  Ultrasound shows hepatic steatosis without focal liver lesions CT abdomen pelvis shows no acute findings but marked hepatic steatosis Acute hepatitis panel for completeness Hold hepatotoxic agents when possible  Lactic acidosis - resolved Likely d/t dehydration Check as needed      DVT prophylaxis: lovenox  Pertinent IV fluids/nutrition: stopping IV fluids given improvement, encourage po as able Central lines / invasive devices: none  Code Status: FULL CODE ACP documentation reviewed: 06/30/22 none on file in [facility]  Current Admission Status: inpatient   TOC needs / Dispo plan: none at this time Barriers to discharge / significant pending items: EtOH withdrawal and AKI treatment, may be able to d/c in a few days once out of window for seizures              Subjective / Brief ROS:  Patient reports tremor and some anxiety, reports dysphagia to solids and inabitlity to swallow solids  Denies CP/SOB.  Pain controlled.  Denies new weakness.  Tolerating liquids.  Reports no concerns w/ urination/defecation.   Family Communication: pt declined call to family or other support persons and asked [place] not to give updates to anyone without his permission     Objective Findings:  Vitals:   06/29/22 2325 06/30/22 0044 06/30/22 0422 06/30/22 0813  BP: 108/82 109/73 123/85 132/84  Pulse: 82 75 73 75  Resp: 17 16 20 16   Temp: 98.7 F (37.1 C) 98.6 F (37 C) 98.1 F (36.7 C) 98.6 F (37 C)  TempSrc: Oral Oral Oral Oral  SpO2: 95% 97% 99% 99%  Weight:  Height:        Intake/Output Summary (Last 24 hours) at 06/30/2022 1454 Last data filed at 06/30/2022 1409 Gross per 24 hour  Intake 1255 ml  Output --  Net 1255 ml   Filed Weights   06/29/22 2127  Weight: 70.3 kg    Examination:  Physical Exam Constitutional:      General: He is not in acute distress.    Appearance: Normal appearance.  Cardiovascular:     Rate and Rhythm: Normal rate and regular  rhythm.  Pulmonary:     Effort: Pulmonary effort is normal.     Breath sounds: Normal breath sounds.  Abdominal:     General: Abdomen is flat. Bowel sounds are normal.     Palpations: Abdomen is soft.  Skin:    General: Skin is warm and dry.  Neurological:     Mental Status: He is alert and oriented to person, place, and time.     Motor: Tremor present.          Scheduled Medications:   aspirin EC  81 mg Oral Daily   diazepam  5 mg Oral Q6H   enoxaparin (LOVENOX) injection  40 mg Subcutaneous Q24H   folic acid  1 mg Oral Daily   multivitamin with minerals  1 tablet Oral Daily   thiamine  100 mg Oral Daily   Or   thiamine  100 mg Intravenous Daily    Continuous Infusions:  magnesium sulfate bolus IVPB      PRN Medications:  LORazepam **OR** LORazepam, ondansetron **OR** ondansetron (ZOFRAN) IV, oxyCODONE  Antimicrobials from admission:  Anti-infectives (From admission, onward)    None           Data Reviewed:  I have personally reviewed the following...  CBC: Recent Labs  Lab 06/29/22 1847 06/30/22 0309  WBC 9.9 6.1  NEUTROABS  --  3.5  HGB 15.2 12.2*  HCT 40.8 34.0*  MCV 99.3 101.8*  PLT 228 126*   Basic Metabolic Panel: Recent Labs  Lab 06/29/22 1847 06/30/22 0309  NA 133* 132*  K 3.6 3.7  CL 89* 93*  CO2 22 27  GLUCOSE 116* 82  BUN 10 10  CREATININE 1.28* 0.88  CALCIUM 8.9 8.0*  MG  --  1.2*   GFR: Estimated Creatinine Clearance: 98.7 mL/min (by C-G formula based on SCr of 0.88 mg/dL). Liver Function Tests: Recent Labs  Lab 06/29/22 1847 06/30/22 0309  AST 282* 196*  ALT 122* 93*  ALKPHOS 71 54  BILITOT 2.0* 2.2*  PROT 8.0 6.4*  ALBUMIN 4.4 3.6   Recent Labs  Lab 06/29/22 1847  LIPASE 40   No results for input(s): "AMMONIA" in the last 168 hours. Coagulation Profile: No results for input(s): "INR", "PROTIME" in the last 168 hours. Cardiac Enzymes: No results for input(s): "CKTOTAL", "CKMB", "CKMBINDEX",  "TROPONINI" in the last 168 hours. BNP (last 3 results) No results for input(s): "PROBNP" in the last 8760 hours. HbA1C: No results for input(s): "HGBA1C" in the last 72 hours. CBG: No results for input(s): "GLUCAP" in the last 168 hours. Lipid Profile: No results for input(s): "CHOL", "HDL", "LDLCALC", "TRIG", "CHOLHDL", "LDLDIRECT" in the last 72 hours. Thyroid Function Tests: No results for input(s): "TSH", "T4TOTAL", "FREET4", "T3FREE", "THYROIDAB" in the last 72 hours. Anemia Panel: No results for input(s): "VITAMINB12", "FOLATE", "FERRITIN", "TIBC", "IRON", "RETICCTPCT" in the last 72 hours. Most Recent Urinalysis On File:     Component Value Date/Time   COLORURINE STRAW (A) 02/23/2018 1610  APPEARANCEUR CLEAR (A) 02/23/2018 0135   APPEARANCEUR Clear 03/25/2014 1100   LABSPEC 1.005 02/23/2018 0135   LABSPEC >1.060 03/25/2014 1100   PHURINE 6.0 02/23/2018 0135   GLUCOSEU NEGATIVE 02/23/2018 0135   GLUCOSEU Negative 03/25/2014 1100   HGBUR NEGATIVE 02/23/2018 0135   HGBUR negative 07/17/2008 0956   BILIRUBINUR NEGATIVE 02/23/2018 0135   BILIRUBINUR Negative 03/25/2014 1100   KETONESUR 5 (A) 02/23/2018 0135   PROTEINUR NEGATIVE 02/23/2018 0135   UROBILINOGEN 0.2 07/17/2008 0956   NITRITE NEGATIVE 02/23/2018 0135   LEUKOCYTESUR NEGATIVE 02/23/2018 0135   LEUKOCYTESUR Negative 03/25/2014 1100   Sepsis Labs: @LABRCNTIP (procalcitonin:4,lacticidven:4) Microbiology: No results found for this or any previous visit (from the past 240 hour(s)).    Radiology Studies last 3 days: ECHOCARDIOGRAM COMPLETE  Result Date: 06/30/2022    ECHOCARDIOGRAM REPORT   Patient Name:   STEPHANIE MCGLONE [employer] Date of Exam: 06/30/2022 Medical Rec #:  [id number]                Height:       71.0 in Accession #:    [id number]               Weight:       155.0 lb Date of Birth:  May 18, 1970                 BSA:          1.892 m Patient Age:    51 years                 BP:           132/84 mmHg Patient  Gender: M                        HR:           75 bpm. Exam Location:  [facility] Procedure: 2D Echo, Cardiac Doppler and Color Doppler Indications:     Syncope R55  History:         Patient has prior history of Echocardiogram examinations, most                  recent 04/07/2022. Stroke; Risk Factors:Hypertension. Alcohol                  abuse.  Sonographer:     Cristela Blue Referring Phys:  [id number] ASIA B ZIERLE-GHOSH Diagnosing Phys: Julien Nordmann MD IMPRESSIONS  1. Left ventricular ejection fraction, by estimation, is 60 to 65%. The left ventricle has normal function. The left ventricle has no regional wall motion abnormalities. Left ventricular diastolic parameters were normal.  2. Right ventricular systolic function is normal. The right ventricular size is normal. There is normal pulmonary artery systolic pressure. The estimated right ventricular systolic pressure is 20.5 mmHg.  3. The mitral valve is normal in structure. No evidence of mitral valve regurgitation. No evidence of mitral stenosis.  4. The aortic valve is normal in structure. Aortic valve regurgitation is not visualized. No aortic stenosis is present.  5. The inferior vena cava is normal in size with greater than 50% respiratory variability, suggesting right atrial pressure of 3 mmHg. FINDINGS  Left Ventricle: Left ventricular ejection fraction, by estimation, is 60 to 65%. The left ventricle has normal function. The left ventricle has no regional wall motion abnormalities. The left ventricular internal cavity size was normal in size. There is  no left ventricular hypertrophy. Left ventricular diastolic parameters were normal. Right Ventricle:  The right ventricular size is normal. No increase in right ventricular wall thickness. Right ventricular systolic function is normal. There is normal pulmonary artery systolic pressure. The tricuspid regurgitant velocity is 2.09 m/s, and  with an assumed right atrial pressure of 3 mmHg, the estimated right  ventricular systolic pressure is 20.5 mmHg. Left Atrium: Left atrial size was normal in size. Right Atrium: Right atrial size was normal in size. Pericardium: There is no evidence of pericardial effusion. Mitral Valve: The mitral valve is normal in structure. No evidence of mitral valve regurgitation. No evidence of mitral valve stenosis. Tricuspid Valve: The tricuspid valve is normal in structure. Tricuspid valve regurgitation is mild . No evidence of tricuspid stenosis. Aortic Valve: The aortic valve is normal in structure. Aortic valve regurgitation is not visualized. No aortic stenosis is present. Aortic valve mean gradient measures 3.0 mmHg. Aortic valve peak gradient measures 4.8 mmHg. Aortic valve area, by VTI measures 2.95 cm. Pulmonic Valve: The pulmonic valve was normal in structure. Pulmonic valve regurgitation is not visualized. No evidence of pulmonic stenosis. Aorta: The aortic root is normal in size and structure. Venous: The inferior vena cava is normal in size with greater than 50% respiratory variability, suggesting right atrial pressure of 3 mmHg. IAS/Shunts: No atrial level shunt detected by color flow Doppler.  LEFT VENTRICLE PLAX 2D LVIDd:         4.50 cm   Diastology LVIDs:         2.70 cm   LV e' medial:    11.20 cm/s LV PW:         0.90 cm   LV E/e' medial:  7.9 LV IVS:        0.80 cm   LV e' lateral:   6.31 cm/s LVOT diam:     2.00 cm   LV E/e' lateral: 13.9 LV SV:         69 LV SV Index:   37 LVOT Area:     3.14 cm  RIGHT VENTRICLE RV Basal diam:  4.60 cm RV Mid diam:    3.70 cm RV S prime:     12.20 cm/s TAPSE (M-mode): 2.0 cm LEFT ATRIUM             Index        RIGHT ATRIUM           Index LA diam:        2.90 cm 1.53 cm/m   RA Area:     12.80 cm LA Vol (A2C):   44.5 ml 23.52 ml/m  RA Volume:   28.90 ml  15.27 ml/m LA Vol (A4C):   22.0 ml 11.63 ml/m LA Biplane Vol: 31.3 ml 16.54 ml/m  AORTIC VALVE AV Area (Vmax):    2.62 cm AV Area (Vmean):   2.30 cm AV Area (VTI):     2.95  cm AV Vmax:           110.00 cm/s AV Vmean:          82.100 cm/s AV VTI:            0.234 m AV Peak Grad:      4.8 mmHg AV Mean Grad:      3.0 mmHg LVOT Vmax:         91.80 cm/s LVOT Vmean:        60.000 cm/s LVOT VTI:          0.220 m LVOT/AV VTI ratio: 0.94  AORTA Ao Root  diam: 3.10 cm MITRAL VALVE               TRICUSPID VALVE MV Area (PHT): 4.49 cm    TR Peak grad:   17.5 mmHg MV Decel Time: 169 msec    TR Vmax:        209.00 cm/s MV E velocity: 88.00 cm/s MV A velocity: 60.80 cm/s  SHUNTS MV E/A ratio:  1.45        Systemic VTI:  0.22 m                            Systemic Diam: 2.00 cm Julien Nordmann MD Electronically signed by Julien Nordmann MD Signature Date/Time: 06/30/2022/12:29:25 PM    Final    US Abdomen Limited RUQ (LIVER/GB)  Result Date: 06/29/2022 CLINICAL DATA:  Transaminitis. EXAM: ULTRASOUND ABDOMEN LIMITED RIGHT UPPER QUADRANT COMPARISON:  April 07, 2022 FINDINGS: Gallbladder: No gallstones or wall thickening visualized (1.7 mm). No sonographic Murphy sign noted by sonographer. Common bile duct: Diameter: 2.0 mm Liver: No focal lesion identified. Diffusely increased echogenicity of the liver parenchyma is noted. This is seen on the prior exam. Portal vein is patent on color Doppler imaging with normal direction of blood flow towards the liver. Other: None. IMPRESSION: Hepatic steatosis without focal liver lesions. Electronically Signed   By: Aram Candela M.D.   On: 06/29/2022 20:56   CT ABDOMEN PELVIS W CONTRAST  Result Date: 06/29/2022 CLINICAL DATA:  Generalized weakness. Abdominal pain. Unresponsiveness. EXAM: CT ABDOMEN AND PELVIS WITH CONTRAST TECHNIQUE: Multidetector CT imaging of the abdomen and pelvis was performed using the standard protocol following bolus administration of intravenous contrast. RADIATION DOSE REDUCTION: This exam was performed according to the departmental dose-optimization program which includes automated exposure control, adjustment of the mA and/or kV  according to patient size and/or use of iterative reconstruction technique. CONTRAST:  OMNIPAQUE IOHEXOL 300 MG/ML  SOLN COMPARISON:  Abdominal ultrasound 04/07/2022 and CT abdomen and pelvis 03/25/2014 FINDINGS: Lower chest: No acute abnormality. Hepatobiliary: Marked hepatic steatosis. Unremarkable gallbladder and biliary tree. Pancreas: Unremarkable. Spleen: Unremarkable. Adrenals/Urinary Tract: Normal adrenal glands. No urinary calculi or hydronephrosis. Unremarkable bladder. Stomach/Bowel: Colonic diverticulosis without diverticulitis. Diffuse fatty infiltration of the colon MVA normal finding or due to sequela of chronic inflammation. No evidence of active inflammation. Normal appendix. Stomach is within normal limits. Vascular/Lymphatic: Mild aortic atherosclerotic calcification. No lymphadenopathy. Reproductive: Unremarkable. Other: No free intraperitoneal fluid or air. Musculoskeletal: No acute fracture. IMPRESSION: 1. No acute findings. 2. Marked hepatic steatosis. 3. Colonic diverticulosis. Aortic Atherosclerosis (ICD10-I70.0). Electronically Signed   By: Minerva Fester M.D.   On: 06/29/2022 20:15             LOS: 0 days       Sunnie Nielsen, DO [facility] 06/30/2022, 2:54 PM    Dictation software may have been used to generate the above note. Typos may occur and escape review in typed/dictated notes. Please contact Dr Lyn Hollingshead directly for clarity if needed.  Staff may message me via secure chat in Epic  but this may not receive an immediate response,  please page me for urgent matters!  If 7PM-7AM, please contact night coverage [email or web address]

## 2022-06-30 NOTE — TOC CM/SW Note (Signed)
Went by room to offer SA resources. Patient was not in the room. Placed resources on chart to go home with him.  Charlynn Court, CSW [phone]

## 2022-06-30 NOTE — Progress Notes (Signed)
Initial Nutrition Assessment  DOCUMENTATION CODES:   Severe malnutrition in context of acute illness/injury  INTERVENTION:   Ensure Enlive po TID, each supplement provides 350 kcal and 20 grams of protein.  MVI, folic acid and thiamine po daily   Pt at high refeed risk; recommend monitor potassium, magnesium and phosphorus labs daily until stable  NUTRITION DIAGNOSIS:   Severe Malnutrition related to acute illness as evidenced by moderate fat depletion, moderate muscle depletion, 7 percent weight loss in < 3 months.  GOAL:   Patient will meet greater than or equal to 90% of their needs  MONITOR:   PO intake, Supplement acceptance, Labs, Weight trends, I & O's, Skin  REASON FOR ASSESSMENT:   Malnutrition Screening Tool    ASSESSMENT:   52 y/o male with h/o ADHD, etoh abuse, HTN, ICH, hepatitis and marijuana use who is admitted with dysphagia, syncope/vasovagal episode and AKI.  Met with pt in room today. Pt reports decreased oral intake for several months pta r/t food getting stuck in his throat and coming back up. Pt reports that he has been unable to eat a steak for about four months. Pt reports that at first, it was just difficult to swallow solid foods but reports that over the 4-5 days, he was unable to get down even water. Pt is hoping to have his "throat stretched" in hospital. Pt reports significant weight loss pta. Pt reports that his UBW is around 175lbs. Per chart, pt is down 11lbs(7%) in < 3 months; this is significant weight loss. Pt reports that he was drinking Ensure at home but reports that even this got difficult to swallow. Pt reports eating 100% of his lunch tray today; pt was surprised that he was able to get down the soup. RD discussed with pt the importance of adequate nutrition needed to preserve lean muscle. Pt is willing to try Ensure again in hospital (prefers strawberry). RD will add supplements to help pt meet his estimated needs. Pt is at refeed risk.    Medications reviewed and include: aspirin, lovenox, folic acid, MVI, thiamine, Mg sulfate  Labs reviewed: Na 132(L), K 3.7 wnl, Mg 1.2(L)  NUTRITION - FOCUSED PHYSICAL EXAM:  Flowsheet Row Most Recent Value  Orbital Region No depletion  Upper Arm Region Moderate depletion  Thoracic and Lumbar Region Mild depletion  Buccal Region No depletion  Temple Region No depletion  Clavicle Bone Region Mild depletion  Clavicle and Acromion Bone Region Mild depletion  Scapular Bone Region No depletion  Dorsal Hand Mild depletion  Patellar Region Severe depletion  Anterior Thigh Region Severe depletion  Posterior Calf Region Severe depletion  Edema (RD Assessment) None  Hair Reviewed  Eyes Reviewed  Mouth Reviewed  Skin Reviewed  Nails Reviewed   Diet Order:    Diet Order             Diet full liquid Room service appropriate? Yes; Fluid consistency: Thin  Diet effective now                  EDUCATION NEEDS:   Education needs have been addressed  Skin:  Skin Assessment: Reviewed RN Assessment  Last BM:  PTA  Height:   Ht Readings from Last 1 Encounters:  06/29/22 5\' 11"  (1.803 m)    Weight:   Wt Readings from Last 1 Encounters:  06/29/22 70.3 kg    Ideal Body Weight:  78 kg  BMI:  Body mass index is 21.62 kg/m.  Estimated Nutritional Needs:  Kcal:  2000-2300kcal/day  Protein:  100-115g/day  Fluid:  2.1-2.4L/day  Betsey Holiday MS, RD, LDN Please refer to [facility] for RD and/or RD on-call/weekend/after hours pager

## 2022-07-01 DIAGNOSIS — F10288 Alcohol dependence with other alcohol-induced disorder: Secondary | ICD-10-CM | POA: Diagnosis not present

## 2022-07-01 DIAGNOSIS — K7010 Alcoholic hepatitis without ascites: Secondary | ICD-10-CM | POA: Diagnosis not present

## 2022-07-01 DIAGNOSIS — E43 Unspecified severe protein-calorie malnutrition: Secondary | ICD-10-CM | POA: Insufficient documentation

## 2022-07-01 DIAGNOSIS — N179 Acute kidney failure, unspecified: Secondary | ICD-10-CM | POA: Diagnosis not present

## 2022-07-01 DIAGNOSIS — E860 Dehydration: Secondary | ICD-10-CM | POA: Diagnosis not present

## 2022-07-01 LAB — COMPREHENSIVE METABOLIC PANEL
ALT: 111 U/L — ABNORMAL HIGH (ref 0–44)
AST: 323 U/L — ABNORMAL HIGH (ref 15–41)
Albumin: 3.2 g/dL — ABNORMAL LOW (ref 3.5–5.0)
Alkaline Phosphatase: 52 U/L (ref 38–126)
Anion gap: 7 (ref 5–15)
BUN: 8 mg/dL (ref 6–20)
CO2: 30 mmol/L (ref 22–32)
Calcium: 8 mg/dL — ABNORMAL LOW (ref 8.9–10.3)
Chloride: 98 mmol/L (ref 98–111)
Creatinine, Ser: 0.76 mg/dL (ref 0.61–1.24)
GFR, Estimated: >60 mL/min (ref >60)
Glucose, Bld: 101 mg/dL — ABNORMAL HIGH (ref 70–99)
Potassium: 3.4 mmol/L — ABNORMAL LOW (ref 3.5–5.1)
Sodium: 135 mmol/L (ref 135–145)
Total Bilirubin: 2.6 mg/dL — ABNORMAL HIGH (ref 0.3–1.2)
Total Protein: 6.2 g/dL — ABNORMAL LOW (ref 6.5–8.1)

## 2022-07-01 LAB — PHOSPHORUS: Phosphorus: 2.5 mg/dL (ref 2.5–4.6)

## 2022-07-01 LAB — MAGNESIUM: Magnesium: 2.1 mg/dL (ref 1.7–2.4)

## 2022-07-01 MED ORDER — DIAZEPAM 5 MG PO TABS
5.0000 mg | ORAL_TABLET | Freq: Three times a day (TID) | ORAL | Status: DC
  Administered 2022-07-01 – 2022-07-02 (×2): 5 mg via ORAL
  Filled 2022-07-01 (×2): qty 1

## 2022-07-01 NOTE — Plan of Care (Signed)
  Problem: Education: Goal: Knowledge of General Education information will improve Description: Including pain rating scale, medication(s)/side effects and non-pharmacologic comfort measures Outcome: Progressing   Problem: Health Behavior/Discharge Planning: Goal: Ability to manage health-related needs will improve Outcome: Progressing   Problem: Clinical Measurements: Goal: Respiratory complications will improve Outcome: Progressing   Problem: Clinical Measurements: Goal: Cardiovascular complication will be avoided Outcome: Progressing   Problem: Activity: Goal: Risk for activity intolerance will decrease Outcome: Progressing   Problem: Coping: Goal: Level of anxiety will decrease Outcome: Progressing   Problem: Pain Managment: Goal: General experience of comfort will improve Outcome: Progressing   Problem: Safety: Goal: Ability to remain free from injury will improve Outcome: Progressing   

## 2022-07-01 NOTE — Progress Notes (Signed)
PROGRESS NOTE    Aleix Karmel [facility]   AVW:[id number] DOB: 04/28/1970  DOA: 06/29/2022 Date of Service: 07/01/22 PCP: Dana Allan, MD     Brief Narrative / Hospital Course:  Levi Mejia is a 52 y.o. male with medical history significant of alcohol abuse, ADHD, HTN, presents to the ED with a chief complaint of nausea and vomiting x4d. Dysphagia, cannot tolerate solids, scheduled for esophageal dilation. (+)EtOH use w/ hx seizure in the past 05/01: to ED, CT abdomen pelvis no acute findings but marked hepatic steatosis, elevated AST/ALT to 282/122 and Tbili 2.0 question alcoholic hepatitis. Admitted for AKI, CIWA protocol, IV fluids 05/02: Cr normalized, AST/ALT improved, lactate WNL. Pt is motivated to stop drinking, will keep inpatient for withdrawal monitoring. D/w GI, holding off on consult for now unless he is not able to tolerate full liquid diet. Valium 5 mg po q6h + prn breakthrough based on CIWA 05/03: AST/ALT increased. CIWA stable around 5 compared to 10 on admission. Valium 5 mg po q8h + prn breakthrough based on CIWA  Consultants:  none  Procedures: none      ASSESSMENT & PLAN:   Principal Problem:   Alcoholic hepatitis Active Problems:   Alcohol dependence (HCC)   Syncope, vasovagal   AKI (acute kidney injury) (HCC)   Lactic acidosis   Dehydration   Alcohol withdrawal (HCC)   Protein-calorie malnutrition, severe   Alcohol dependence (HCC) Had alcoholic seizure last year when trying to quit Has not had a drink in about 9 hours and already feels tremulous CIWA protocol Scheduled benzodiazepine q6h --> q8h today --> q12h tomorrow or day after if doing well  Prn benzodiazepine for breakthrough symptoms   AKI (acute kidney injury) (HCC) - resolved  Dehydration - improved  Po fluids  Monitor BMP   Syncope, vasovagal Patient had a syncopal episode after nausea and vomiting With a history of alcohol dependence and recent abstinence from  alcohol continue seizure precautions Check echo for completeness --> no concerns   Dysphagia to solids Spoke w/ GI 05/02, defer consult for now Consider consult GI when he is outside window for withdrawals Consider consult if unable to tolerate liquids  Full liquid diet   Alcoholic hepatitis Transaminitis  Hepatic steatosis  Acute hepatitis panel for completeness --> NR Hold hepatotoxic agents when possible  Lactic acidosis - resolved Likely d/t dehydration Check as needed      DVT prophylaxis: lovenox  Pertinent IV fluids/nutrition: stopping IV fluids given improvement, encourage po as able Central lines / invasive devices: none  Code Status: FULL CODE ACP documentation reviewed: 06/30/22 none on file in [facility]  Current Admission Status: inpatient   TOC needs / Dispo plan: none at this time Barriers to discharge / significant pending items: EtOH withdrawal and AKI treatment, may be able to d/c in a few days once out of window for DT              Subjective / Brief ROS:  Patient reports tremor and some anxiety, reports dysphagia to solids and inabitlity to swallow solids - no worse from yesterday  Denies CP/SOB.  Pain controlled.  Denies new weakness.  Tolerating liquids.  Reports no concerns w/ urination/defecation.   Family Communication: pt declined call to family or other support persons and asked [place] not to give updates to anyone without his permission     Objective Findings:  Vitals:   06/30/22 1605 06/30/22 2010 07/01/22 0444 07/01/22 0804  BP: (!) 127/90 121/85 (!) 122/91 Marland Kitchen)  135/96  Pulse: 91 89 76 87  Resp: 16 18 17 16   Temp: 98 F (36.7 C) 98 F (36.7 C)  98.8 F (37.1 C)  TempSrc:  Oral  Oral  SpO2: 98% 99% 98% 100%  Weight:      Height:        Intake/Output Summary (Last 24 hours) at 07/01/2022 1459 Last data filed at 07/01/2022 1109 Gross per 24 hour  Intake 477 ml  Output --  Net 477 ml   Filed Weights   06/29/22 2127   Weight: 70.3 kg    Examination:  Physical Exam Constitutional:      General: He is not in acute distress.    Appearance: Normal appearance.  Cardiovascular:     Rate and Rhythm: Normal rate and regular rhythm.  Pulmonary:     Effort: Pulmonary effort is normal.     Breath sounds: Normal breath sounds.  Abdominal:     General: Abdomen is flat. Bowel sounds are normal.     Palpations: Abdomen is soft.  Skin:    General: Skin is warm and dry.  Neurological:     Mental Status: He is alert and oriented to person, place, and time.     Motor: Tremor present.          Scheduled Medications:   aspirin EC  81 mg Oral Daily   diazepam  5 mg Oral Q8H   enoxaparin (LOVENOX) injection  40 mg Subcutaneous Q24H   feeding supplement  237 mL Oral TID BM   folic acid  1 mg Oral Daily   multivitamin with minerals  1 tablet Oral Daily   thiamine  100 mg Oral Daily   Or   thiamine  100 mg Intravenous Daily    Continuous Infusions:    PRN Medications:  LORazepam **OR** LORazepam, ondansetron **OR** ondansetron (ZOFRAN) IV, oxyCODONE  Antimicrobials from admission:  Anti-infectives (From admission, onward)    None           Data Reviewed:  I have personally reviewed the following...  CBC: Recent Labs  Lab 06/29/22 1847 06/30/22 0309  WBC 9.9 6.1  NEUTROABS  --  3.5  HGB 15.2 12.2*  HCT 40.8 34.0*  MCV 99.3 101.8*  PLT 228 126*   Basic Metabolic Panel: Recent Labs  Lab 06/29/22 1847 06/30/22 0309 07/01/22 0543  NA 133* 132* 135  K 3.6 3.7 3.4*  CL 89* 93* 98  CO2 22 27 30   GLUCOSE 116* 82 101*  BUN 10 10 8   CREATININE 1.28* 0.88 0.76  CALCIUM 8.9 8.0* 8.0*  MG  --  1.2* 2.1  PHOS  --   --  2.5   GFR: Estimated Creatinine Clearance: 108.6 mL/min (by C-G formula based on SCr of 0.76 mg/dL). Liver Function Tests: Recent Labs  Lab 06/29/22 1847 06/30/22 0309 07/01/22 0543  AST 282* 196* 323*  ALT 122* 93* 111*  ALKPHOS 71 54 52  BILITOT 2.0*  2.2* 2.6*  PROT 8.0 6.4* 6.2*  ALBUMIN 4.4 3.6 3.2*   Recent Labs  Lab 06/29/22 1847  LIPASE 40   No results for input(s): "AMMONIA" in the last 168 hours. Coagulation Profile: No results for input(s): "INR", "PROTIME" in the last 168 hours. Cardiac Enzymes: No results for input(s): "CKTOTAL", "CKMB", "CKMBINDEX", "TROPONINI" in the last 168 hours. BNP (last 3 results) No results for input(s): "PROBNP" in the last 8760 hours. HbA1C: No results for input(s): "HGBA1C" in the last 72 hours. CBG: No results  for input(s): "GLUCAP" in the last 168 hours. Lipid Profile: No results for input(s): "CHOL", "HDL", "LDLCALC", "TRIG", "CHOLHDL", "LDLDIRECT" in the last 72 hours. Thyroid Function Tests: No results for input(s): "TSH", "T4TOTAL", "FREET4", "T3FREE", "THYROIDAB" in the last 72 hours. Anemia Panel: No results for input(s): "VITAMINB12", "FOLATE", "FERRITIN", "TIBC", "IRON", "RETICCTPCT" in the last 72 hours. Most Recent Urinalysis On File:     Component Value Date/Time   COLORURINE STRAW (A) 02/23/2018 0135   APPEARANCEUR CLEAR (A) 02/23/2018 0135   APPEARANCEUR Clear 03/25/2014 1100   LABSPEC 1.005 02/23/2018 0135   LABSPEC >1.060 03/25/2014 1100   PHURINE 6.0 02/23/2018 0135   GLUCOSEU NEGATIVE 02/23/2018 0135   GLUCOSEU Negative 03/25/2014 1100   HGBUR NEGATIVE 02/23/2018 0135   HGBUR negative 07/17/2008 0956   BILIRUBINUR NEGATIVE 02/23/2018 0135   BILIRUBINUR Negative 03/25/2014 1100   KETONESUR 5 (A) 02/23/2018 0135   PROTEINUR NEGATIVE 02/23/2018 0135   UROBILINOGEN 0.2 07/17/2008 0956   NITRITE NEGATIVE 02/23/2018 0135   LEUKOCYTESUR NEGATIVE 02/23/2018 0135   LEUKOCYTESUR Negative 03/25/2014 1100   Sepsis Labs: @LABRCNTIP (procalcitonin:4,lacticidven:4) Microbiology: No results found for this or any previous visit (from the past 240 hour(s)).    Radiology Studies last 3 days: ECHOCARDIOGRAM COMPLETE  Result Date: 06/30/2022    ECHOCARDIOGRAM REPORT    Patient Name:   Levi Mejia [facility] Date of Exam: 06/30/2022 Medical Rec #:  [id number]                Height:       71.0 in Accession #:    [id number]               Weight:       155.0 lb Date of Birth:  08-29-1970                 BSA:          1.892 m Patient Age:    51 years                 BP:           132/84 mmHg Patient Gender: M                        HR:           75 bpm. Exam Location:  [facility] Procedure: 2D Echo, Cardiac Doppler and Color Doppler Indications:     Syncope R55  History:         Patient has prior history of Echocardiogram examinations, most                  recent 04/07/2022. Stroke; Risk Factors:Hypertension. Alcohol                  abuse.  Sonographer:     Cristela Blue Referring Phys:  [id number] ASIA B ZIERLE-GHOSH Diagnosing Phys: Julien Nordmann MD IMPRESSIONS  1. Left ventricular ejection fraction, by estimation, is 60 to 65%. The left ventricle has normal function. The left ventricle has no regional wall motion abnormalities. Left ventricular diastolic parameters were normal.  2. Right ventricular systolic function is normal. The right ventricular size is normal. There is normal pulmonary artery systolic pressure. The estimated right ventricular systolic pressure is 20.5 mmHg.  3. The mitral valve is normal in structure. No evidence of mitral valve regurgitation. No evidence of mitral stenosis.  4. The aortic valve is normal in structure. Aortic valve regurgitation is not visualized. No aortic  stenosis is present.  5. The inferior vena cava is normal in size with greater than 50% respiratory variability, suggesting right atrial pressure of 3 mmHg. FINDINGS  Left Ventricle: Left ventricular ejection fraction, by estimation, is 60 to 65%. The left ventricle has normal function. The left ventricle has no regional wall motion abnormalities. The left ventricular internal cavity size was normal in size. There is  no left ventricular hypertrophy. Left ventricular diastolic parameters were normal.  Right Ventricle: The right ventricular size is normal. No increase in right ventricular wall thickness. Right ventricular systolic function is normal. There is normal pulmonary artery systolic pressure. The tricuspid regurgitant velocity is 2.09 m/s, and  with an assumed right atrial pressure of 3 mmHg, the estimated right ventricular systolic pressure is 20.5 mmHg. Left Atrium: Left atrial size was normal in size. Right Atrium: Right atrial size was normal in size. Pericardium: There is no evidence of pericardial effusion. Mitral Valve: The mitral valve is normal in structure. No evidence of mitral valve regurgitation. No evidence of mitral valve stenosis. Tricuspid Valve: The tricuspid valve is normal in structure. Tricuspid valve regurgitation is mild . No evidence of tricuspid stenosis. Aortic Valve: The aortic valve is normal in structure. Aortic valve regurgitation is not visualized. No aortic stenosis is present. Aortic valve mean gradient measures 3.0 mmHg. Aortic valve peak gradient measures 4.8 mmHg. Aortic valve area, by VTI measures 2.95 cm. Pulmonic Valve: The pulmonic valve was normal in structure. Pulmonic valve regurgitation is not visualized. No evidence of pulmonic stenosis. Aorta: The aortic root is normal in size and structure. Venous: The inferior vena cava is normal in size with greater than 50% respiratory variability, suggesting right atrial pressure of 3 mmHg. IAS/Shunts: No atrial level shunt detected by color flow Doppler.  LEFT VENTRICLE PLAX 2D LVIDd:         4.50 cm   Diastology LVIDs:         2.70 cm   LV e' medial:    11.20 cm/s LV PW:         0.90 cm   LV E/e' medial:  7.9 LV IVS:        0.80 cm   LV e' lateral:   6.31 cm/s LVOT diam:     2.00 cm   LV E/e' lateral: 13.9 LV SV:         69 LV SV Index:   37 LVOT Area:     3.14 cm  RIGHT VENTRICLE RV Basal diam:  4.60 cm RV Mid diam:    3.70 cm RV S prime:     12.20 cm/s TAPSE (M-mode): 2.0 cm LEFT ATRIUM             Index         RIGHT ATRIUM           Index LA diam:        2.90 cm 1.53 cm/m   RA Area:     12.80 cm LA Vol (A2C):   44.5 ml 23.52 ml/m  RA Volume:   28.90 ml  15.27 ml/m LA Vol (A4C):   22.0 ml 11.63 ml/m LA Biplane Vol: 31.3 ml 16.54 ml/m  AORTIC VALVE AV Area (Vmax):    2.62 cm AV Area (Vmean):   2.30 cm AV Area (VTI):     2.95 cm AV Vmax:           110.00 cm/s AV Vmean:          82.100  cm/s AV VTI:            0.234 m AV Peak Grad:      4.8 mmHg AV Mean Grad:      3.0 mmHg LVOT Vmax:         91.80 cm/s LVOT Vmean:        60.000 cm/s LVOT VTI:          0.220 m LVOT/AV VTI ratio: 0.94  AORTA Ao Root diam: 3.10 cm MITRAL VALVE               TRICUSPID VALVE MV Area (PHT): 4.49 cm    TR Peak grad:   17.5 mmHg MV Decel Time: 169 msec    TR Vmax:        209.00 cm/s MV E velocity: 88.00 cm/s MV A velocity: 60.80 cm/s  SHUNTS MV E/A ratio:  1.45        Systemic VTI:  0.22 m                            Systemic Diam: 2.00 cm Julien Nordmann MD Electronically signed by Julien Nordmann MD Signature Date/Time: 06/30/2022/12:29:25 PM    Final    US Abdomen Limited RUQ (LIVER/GB)  Result Date: 06/29/2022 CLINICAL DATA:  Transaminitis. EXAM: ULTRASOUND ABDOMEN LIMITED RIGHT UPPER QUADRANT COMPARISON:  April 07, 2022 FINDINGS: Gallbladder: No gallstones or wall thickening visualized (1.7 mm). No sonographic Murphy sign noted by sonographer. Common bile duct: Diameter: 2.0 mm Liver: No focal lesion identified. Diffusely increased echogenicity of the liver parenchyma is noted. This is seen on the prior exam. Portal vein is patent on color Doppler imaging with normal direction of blood flow towards the liver. Other: None. IMPRESSION: Hepatic steatosis without focal liver lesions. Electronically Signed   By: Aram Candela M.D.   On: 06/29/2022 20:56   CT ABDOMEN PELVIS W CONTRAST  Result Date: 06/29/2022 CLINICAL DATA:  Generalized weakness. Abdominal pain. Unresponsiveness. EXAM: CT ABDOMEN AND PELVIS WITH CONTRAST TECHNIQUE:  Multidetector CT imaging of the abdomen and pelvis was performed using the standard protocol following bolus administration of intravenous contrast. RADIATION DOSE REDUCTION: This exam was performed according to the departmental dose-optimization program which includes automated exposure control, adjustment of the mA and/or kV according to patient size and/or use of iterative reconstruction technique. CONTRAST:  OMNIPAQUE IOHEXOL 300 MG/ML  SOLN COMPARISON:  Abdominal ultrasound 04/07/2022 and CT abdomen and pelvis 03/25/2014 FINDINGS: Lower chest: No acute abnormality. Hepatobiliary: Marked hepatic steatosis. Unremarkable gallbladder and biliary tree. Pancreas: Unremarkable. Spleen: Unremarkable. Adrenals/Urinary Tract: Normal adrenal glands. No urinary calculi or hydronephrosis. Unremarkable bladder. Stomach/Bowel: Colonic diverticulosis without diverticulitis. Diffuse fatty infiltration of the colon MVA normal finding or due to sequela of chronic inflammation. No evidence of active inflammation. Normal appendix. Stomach is within normal limits. Vascular/Lymphatic: Mild aortic atherosclerotic calcification. No lymphadenopathy. Reproductive: Unremarkable. Other: No free intraperitoneal fluid or air. Musculoskeletal: No acute fracture. IMPRESSION: 1. No acute findings. 2. Marked hepatic steatosis. 3. Colonic diverticulosis. Aortic Atherosclerosis (ICD10-I70.0). Electronically Signed   By: Minerva Fester M.D.   On: 06/29/2022 20:15             LOS: 1 day       Levi Nielsen, DO [facility] 07/01/2022, 2:59 PM    Dictation software may have been used to generate the above note. Typos may occur and escape review in typed/dictated notes. Please contact Dr Lyn Hollingshead directly for clarity if needed.  Staff may message me via secure chat in Epic  but this may not receive an immediate response,  please page me for urgent matters!  If 7PM-7AM, please contact night  coverage [email or web address]

## 2022-07-02 DIAGNOSIS — E860 Dehydration: Secondary | ICD-10-CM | POA: Diagnosis not present

## 2022-07-02 DIAGNOSIS — F10288 Alcohol dependence with other alcohol-induced disorder: Secondary | ICD-10-CM | POA: Diagnosis not present

## 2022-07-02 DIAGNOSIS — R112 Nausea with vomiting, unspecified: Secondary | ICD-10-CM

## 2022-07-02 DIAGNOSIS — K7010 Alcoholic hepatitis without ascites: Secondary | ICD-10-CM | POA: Diagnosis not present

## 2022-07-02 DIAGNOSIS — R1319 Other dysphagia: Secondary | ICD-10-CM | POA: Diagnosis not present

## 2022-07-02 DIAGNOSIS — N179 Acute kidney failure, unspecified: Secondary | ICD-10-CM | POA: Diagnosis not present

## 2022-07-02 LAB — CBC
HCT: 35.3 % — ABNORMAL LOW (ref 39.0–52.0)
Hemoglobin: 12.4 g/dL — ABNORMAL LOW (ref 13.0–17.0)
MCH: 35.9 pg — ABNORMAL HIGH (ref 26.0–34.0)
MCHC: 35.1 g/dL (ref 30.0–36.0)
MCV: 102.3 fL — ABNORMAL HIGH (ref 80.0–100.0)
Platelets: 138 10*3/uL — ABNORMAL LOW (ref 150–400)
RBC: 3.45 MIL/uL — ABNORMAL LOW (ref 4.22–5.81)
RDW: 11.8 % (ref 11.5–15.5)
WBC: 4.6 10*3/uL (ref 4.0–10.5)
nRBC: 0 % (ref 0.0–0.2)

## 2022-07-02 LAB — COMPREHENSIVE METABOLIC PANEL
ALT: 138 U/L — ABNORMAL HIGH (ref 0–44)
AST: 315 U/L — ABNORMAL HIGH (ref 15–41)
Albumin: 3.4 g/dL — ABNORMAL LOW (ref 3.5–5.0)
Alkaline Phosphatase: 69 U/L (ref 38–126)
Anion gap: 9 (ref 5–15)
BUN: 5 mg/dL — ABNORMAL LOW (ref 6–20)
CO2: 28 mmol/L (ref 22–32)
Calcium: 8.6 mg/dL — ABNORMAL LOW (ref 8.9–10.3)
Chloride: 97 mmol/L — ABNORMAL LOW (ref 98–111)
Creatinine, Ser: 0.65 mg/dL (ref 0.61–1.24)
GFR, Estimated: >60 mL/min (ref >60)
Glucose, Bld: 108 mg/dL — ABNORMAL HIGH (ref 70–99)
Potassium: 3.2 mmol/L — ABNORMAL LOW (ref 3.5–5.1)
Sodium: 134 mmol/L — ABNORMAL LOW (ref 135–145)
Total Bilirubin: 2.6 mg/dL — ABNORMAL HIGH (ref 0.3–1.2)
Total Protein: 6.5 g/dL (ref 6.5–8.1)

## 2022-07-02 LAB — PHOSPHORUS: Phosphorus: 2.4 mg/dL — ABNORMAL LOW (ref 2.5–4.6)

## 2022-07-02 LAB — MAGNESIUM: Magnesium: 1.7 mg/dL (ref 1.7–2.4)

## 2022-07-02 MED ORDER — DIAZEPAM 5 MG PO TABS
5.0000 mg | ORAL_TABLET | Freq: Two times a day (BID) | ORAL | Status: AC
  Administered 2022-07-02 – 2022-07-03 (×2): 5 mg via ORAL
  Filled 2022-07-02 (×2): qty 1

## 2022-07-02 MED ORDER — POTASSIUM CHLORIDE CRYS ER 20 MEQ PO TBCR
40.0000 meq | EXTENDED_RELEASE_TABLET | Freq: Once | ORAL | Status: AC
  Administered 2022-07-02: 40 meq via ORAL
  Filled 2022-07-02: qty 2

## 2022-07-02 NOTE — Consult Note (Signed)
Levi Minium, MD [facility]  [address] [place], [place] [address] Phone: [phone] Fax : [phone]  Consultation  Referring Provider:     Dr. Lyn Hollingshead Primary Care Physician:  Dana Allan, MD Primary Gastroenterologist:  Dr. Allegra Lai         Reason for Consultation:     Dysphagia  Date of Admission:  06/29/2022 Date of Consultation:  07/02/2022         HPI:   Levi Mejia is a 52 y.o. male who reports dysphagia for the last year.  He states that has gotten worse over the last couple of months.  The patient was admitted with alcohol withdrawal.  The patient states his last drink was on Tuesday.  The patient reports that he is not able to tolerate solids and he was scheduled for a appointment with Dr. Allegra Lai on the seventh for the dysphagia.  The patient was admitted to the emergency department on the first of this month due to emesis.  It appears that the patient was told that he should follow-up with Dr. Allegra Lai as an outpatient after discharge but he insisted on seeing GI while in the hospital.  The patient endorses a 25 pound weight loss over the last year.  He also denies any black stools or bloody stools.  He also denies any abdominal pain associated with his dysphagia.  Past Medical History:  Diagnosis Date   Alcohol abuse    Attention deficit disorder without mention of hyperactivity    HTN (hypertension)    ICH (intracerebral hemorrhage) (HCC)    Seizures (HCC)    Stroke [facility])     Past Surgical History:  Procedure Laterality Date   Hand tendon reattachment  02/28/1990   Right hand   toe reattachment  02/28/1994   Right 1st toe    Prior to Admission medications   Medication Sig Start Date End Date Taking? Authorizing Provider  aspirin EC 81 MG tablet Take 1 tablet (81 mg total) by mouth daily. 04/08/22   Sunnie Nielsen, DO  ibuprofen (ADVIL) 200 MG tablet Take 2 tablets (400 mg total) by mouth every 6 (six) hours as needed for fever, headache or  mild pain. 04/08/22   Sunnie Nielsen, DO  lisinopril (ZESTRIL) 5 MG tablet Take 5 mg by mouth daily. Patient not taking: Reported on 06/30/2022    [provider]  losartan (COZAAR) 25 MG tablet Take 0.5 tablets (12.5 mg total) by mouth daily. 06/23/22   Dana Allan, MD  Multiple Vitamin (MULTIVITAMIN WITH MINERALS) TABS tablet Take 1 tablet by mouth daily. 04/09/22   Sunnie Nielsen, DO    Family History  Problem Relation Age of Onset   Heart attack Father 9   Hypertension Father    Coronary artery disease Mother 58       Stent   Cancer Mother        Bone   Hypertension Mother    Hypertension Brother    Diabetes Neg Hx    Prostate cancer Neg Hx    Colon cancer Neg Hx    Heart disease Neg Hx      Social History   Tobacco Use   Smoking status: Never   Smokeless tobacco: Never  Vaping Use   Vaping Use: Former  Substance Use Topics   Alcohol use: Yes    Comment: Regular   Drug use: Not Currently    Types: Marijuana    Allergies as of 06/29/2022   (  No Known Allergies)    Review of Systems:    All systems reviewed and negative except where noted in HPI.   Physical Exam:  Vital signs in last 24 hours: Temp:  [98.9 F (37.2 C)-99.1 F (37.3 C)] 98.9 F (37.2 C) (05/04 0805) Pulse Rate:  [79-85] 85 (05/04 0805) Resp:  [16-18] 16 (05/04 0805) BP: (100-124)/(72-81) 110/78 (05/04 0805) SpO2:  [97 %-100 %] 99 % (05/04 0805) Weight:  [64.5 kg] 64.5 kg (05/04 0412) Last BM Date : 07/01/22 General:   Pleasant, cooperative in NAD Head:  Normocephalic and atraumatic. Eyes:   No icterus.   Conjunctiva pink. PERRLA. Ears:  Normal auditory acuity. Neck:  Supple; no masses or thyroidomegaly Lungs: Respirations even and unlabored. Lungs clear to auscultation bilaterally.   No wheezes, crackles, or rhonchi.  Heart:  Regular rate and rhythm;  Without murmur, clicks, rubs or gallops Abdomen:  Soft, nondistended, nontender. Normal bowel sounds. No appreciable masses  or hepatomegaly.  No rebound or guarding.  Rectal:  Not performed. Msk:  Symmetrical without gross deformities.    Extremities:  Without edema, cyanosis or clubbing. Neurologic:  Alert and oriented x3;  grossly normal neurologically. Skin:  Intact without significant lesions or rashes. Cervical Nodes:  No significant cervical adenopathy. Psych:  Alert and cooperative. Normal affect.  LAB RESULTS: Recent Labs    06/29/22 1847 06/30/22 0309 07/02/22 0402  WBC 9.9 6.1 4.6  HGB 15.2 12.2* 12.4*  HCT 40.8 34.0* 35.3*  PLT 228 126* 138*   BMET Recent Labs    06/30/22 0309 07/01/22 0543 07/02/22 0402  NA 132* 135 134*  K 3.7 3.4* 3.2*  CL 93* 98 97*  CO2 27 30 28   GLUCOSE 82 101* 108*  BUN 10 8 5*  CREATININE 0.88 0.76 0.65  CALCIUM 8.0* 8.0* 8.6*   LFT Recent Labs    07/02/22 0402  PROT 6.5  ALBUMIN 3.4*  AST 315*  ALT 138*  ALKPHOS 69  BILITOT 2.6*   PT/INR No results for input(s): "LABPROT", "INR" in the last 72 hours.  STUDIES: No results found.    Impression / Plan:   Assessment: Principal Problem:   Alcoholic hepatitis Active Problems:   Syncope, vasovagal   Alcohol dependence (HCC)   AKI (acute kidney injury) (HCC)   Lactic acidosis   Dehydration   Alcohol withdrawal (HCC)   Protein-calorie malnutrition, severe   Levi Mejia is a 52 y.o. y/o male with a year-long of dysphagia with worsening over the last few months.  The patient is now in the hospital for alcohol withdrawal.  The patient has a outpatient visit with Dr. Allegra Lai on the seventh of this month.  Plan:  The patient has been told that he has the option of seeing Dr. Allegra Lai in the office for his dysphagia since that has been going on for so many months and then decide with her when she would like to proceed with an upper endoscopy.  If the patient is still in the hospital throughout the weekend and until Monday afternoon then there is opening in my schedule for Monday  afternoon to do a procedure on him as an inpatient if he is still here for other reasons otherwise he should follow-up with Dr. Allegra Lai on Tuesday.  The patient has been explained this in stated that he would like to talk to his wife about whether or not he would stay in the hospital awaiting a further workup for his dysphagia.  It is likely  that his dysphagia is caused by his alcohol use and esophagitis and possible esophageal stricture although esophageal cancer is much more common in alcoholics.  Either way the patient will need a upper endoscopy whether as an outpatient or inpatient is up to the hospitalist team and the patient.  Thank you for involving me in the care of this patient.      LOS: 2 days   Levi Minium, MD, [facility] 07/02/2022, 1:45 PM,  Pager [phone] 7am-5pm  Check AMION for 5pm -7am coverage and on weekends   Note: This dictation was prepared with Dragon dictation along with smaller phrase technology. Any transcriptional errors that result from this process are unintentional.

## 2022-07-02 NOTE — Progress Notes (Signed)
PROGRESS NOTE    Drayton Matlock [facility]   ZOX:[id number] DOB: [birth year]  DOA: 06/29/2022 Date of Service: 07/02/22 PCP: Dana Allan, MD     Brief Narrative / Hospital Course:  Levi Mejia is a 52 y.o. male with medical history significant of alcohol abuse, ADHD, HTN, presents to the ED with a chief complaint of nausea and vomiting x4d. Dysphagia, cannot tolerate solids, scheduled for esophageal dilation. (+)EtOH use w/ hx seizure in the past 05/01: to ED, CT abdomen pelvis no acute findings but marked hepatic steatosis, elevated AST/ALT to 282/122 and Tbili 2.0 question alcoholic hepatitis. Admitted for AKI, CIWA protocol, IV fluids 05/02: Cr normalized, AST/ALT improved, lactate WNL. Pt is motivated to stop drinking, will keep inpatient for withdrawal monitoring. D/w GI, holding off on consult for now unless he is not able to tolerate full liquid diet. Valium 5 mg po q6h + prn breakthrough based on CIWA 05/03: AST/ALT increased. CIWA stable around 5 compared to 10 on admission. Valium 5 mg po q8h + prn breakthrough based on CIWA 05/04: low CIWA. Valium to q12h later today. If stable tomorrow, will be outside window for DT and can potentially discharge home unless GI planning on procedure.   Consultants:  none  Procedures: none      ASSESSMENT & PLAN:   Principal Problem:   Alcoholic hepatitis Active Problems:   Alcohol dependence (HCC)   Syncope, vasovagal   AKI (acute kidney injury) (HCC)   Lactic acidosis   Dehydration   Alcohol withdrawal (HCC)   Protein-calorie malnutrition, severe   Alcohol dependence (HCC) Had alcoholic seizure last year when trying to quit Has not had a drink in about 9 hours and already feels tremulous CIWA protocol Scheduled benzodiazepine q6h --> q8h today --> q12h tomorrow  Prn benzodiazepine for breakthrough symptoms   AKI (acute kidney injury) (HCC) - resolved  Dehydration - improved  Po fluids  Monitor BMP    Syncope, vasovagal Patient had a syncopal episode after nausea and vomiting With a history of alcohol dependence and recent abstinence from alcohol continue seizure precautions Check echo for completeness --> no concerns   Dysphagia to solids Spoke w/ GI 05/02, defer consult for now. Pt has asked me to reach out again, he really wants to get EGD done while here. I will ask GI as courtesy but I have let patient know the decision will be up to GI physician and they may decide to proceed outpatient.  Will certainly consult GI if unable to tolerate liquids  Full liquid diet tolerated thus far   Alcoholic hepatitis Transaminitis  Hepatic steatosis without cirrhosis Acute hepatitis panel for completeness --> NR Hold hepatotoxic agents when possible Will need GI follow-up   Lactic acidosis - resolved Likely d/t dehydration Check as needed      DVT prophylaxis: lovenox  Pertinent IV fluids/nutrition: stopping IV fluids given improvement, encourage po as able Central lines / invasive devices: none  Code Status: FULL CODE ACP documentation reviewed: 06/30/22 none on file in [facility]  Current Admission Status: inpatient   TOC needs / Dispo plan: none at this time Barriers to discharge / significant pending items: EtOH withdrawal and AKI treatment, may be able to d/c tomorrow once out of window for DT              Subjective / Brief ROS:  Dysphagia to solids and inabitlity to swallow solids - no worse from yesterday  Denies CP/SOB.  Pain controlled.  Denies new weakness.  Tolerating liquids.  Reports no concerns w/ urination/defecation.   Family Communication: pt declined call to family or other support persons and asked [place] not to give updates to anyone without his permission     Objective Findings:  Vitals:   07/01/22 2149 07/02/22 0412 07/02/22 0415 07/02/22 0805  BP: 124/81  100/72 110/78  Pulse: 84  79 85  Resp: 18  18 16   Temp: 99.1 F (37.3 C)  98.9 F  (37.2 C) 98.9 F (37.2 C)  TempSrc: Oral  Oral Oral  SpO2: 100%  97% 99%  Weight:  64.5 kg    Height:        Intake/Output Summary (Last 24 hours) at 07/02/2022 1241 Last data filed at 07/01/2022 1907 Gross per 24 hour  Intake 720 ml  Output --  Net 720 ml   Filed Weights   06/29/22 2127 07/02/22 0412  Weight: 70.3 kg 64.5 kg    Examination:  Physical Exam Constitutional:      General: He is not in acute distress.    Appearance: Normal appearance.  Cardiovascular:     Rate and Rhythm: Normal rate and regular rhythm.  Pulmonary:     Effort: Pulmonary effort is normal.     Breath sounds: Normal breath sounds.  Abdominal:     General: Abdomen is flat. Bowel sounds are normal.     Palpations: Abdomen is soft.  Skin:    General: Skin is warm and dry.  Neurological:     Mental Status: He is alert and oriented to person, place, and time.     Motor: No tremor.          Scheduled Medications:   aspirin EC  81 mg Oral Daily   diazepam  5 mg Oral Q8H   enoxaparin (LOVENOX) injection  40 mg Subcutaneous Q24H   feeding supplement  237 mL Oral TID BM   folic acid  1 mg Oral Daily   multivitamin with minerals  1 tablet Oral Daily   thiamine  100 mg Oral Daily   Or   thiamine  100 mg Intravenous Daily    Continuous Infusions:    PRN Medications:  LORazepam **OR** LORazepam, ondansetron **OR** ondansetron (ZOFRAN) IV, oxyCODONE  Antimicrobials from admission:  Anti-infectives (From admission, onward)    None           Data Reviewed:  I have personally reviewed the following...  CBC: Recent Labs  Lab 06/29/22 1847 06/30/22 0309 07/02/22 0402  WBC 9.9 6.1 4.6  NEUTROABS  --  3.5  --   HGB 15.2 12.2* 12.4*  HCT 40.8 34.0* 35.3*  MCV 99.3 101.8* 102.3*  PLT 228 126* 138*   Basic Metabolic Panel: Recent Labs  Lab 06/29/22 1847 06/30/22 0309 07/01/22 0543 07/02/22 0402  NA 133* 132* 135 134*  K 3.6 3.7 3.4* 3.2*  CL 89* 93* 98 97*  CO2 22  27 30 28   GLUCOSE 116* 82 101* 108*  BUN 10 10 8  5*  CREATININE 1.28* 0.88 0.76 0.65  CALCIUM 8.9 8.0* 8.0* 8.6*  MG  --  1.2* 2.1 1.7  PHOS  --   --  2.5 2.4*   GFR: Estimated Creatinine Clearance: 99.7 mL/min (by C-G formula based on SCr of 0.65 mg/dL). Liver Function Tests: Recent Labs  Lab 06/29/22 1847 06/30/22 0309 07/01/22 0543 07/02/22 0402  AST 282* 196* 323* 315*  ALT 122* 93* 111* 138*  ALKPHOS 71 54 52 69  BILITOT 2.0* 2.2* 2.6* 2.6*  PROT 8.0 6.4* 6.2* 6.5  ALBUMIN 4.4 3.6 3.2* 3.4*   Recent Labs  Lab 06/29/22 1847  LIPASE 40   No results for input(s): "AMMONIA" in the last 168 hours. Coagulation Profile: No results for input(s): "INR", "PROTIME" in the last 168 hours. Cardiac Enzymes: No results for input(s): "CKTOTAL", "CKMB", "CKMBINDEX", "TROPONINI" in the last 168 hours. BNP (last 3 results) No results for input(s): "PROBNP" in the last 8760 hours. HbA1C: No results for input(s): "HGBA1C" in the last 72 hours. CBG: No results for input(s): "GLUCAP" in the last 168 hours. Lipid Profile: No results for input(s): "CHOL", "HDL", "LDLCALC", "TRIG", "CHOLHDL", "LDLDIRECT" in the last 72 hours. Thyroid Function Tests: No results for input(s): "TSH", "T4TOTAL", "FREET4", "T3FREE", "THYROIDAB" in the last 72 hours. Anemia Panel: No results for input(s): "VITAMINB12", "FOLATE", "FERRITIN", "TIBC", "IRON", "RETICCTPCT" in the last 72 hours. Most Recent Urinalysis On File:     Component Value Date/Time   COLORURINE STRAW (A) 02/23/2018 0135   APPEARANCEUR CLEAR (A) 02/23/2018 0135   APPEARANCEUR Clear 03/25/2014 1100   LABSPEC 1.005 02/23/2018 0135   LABSPEC >1.060 03/25/2014 1100   PHURINE 6.0 02/23/2018 0135   GLUCOSEU NEGATIVE 02/23/2018 0135   GLUCOSEU Negative 03/25/2014 1100   HGBUR NEGATIVE 02/23/2018 0135   HGBUR negative 07/17/2008 0956   BILIRUBINUR NEGATIVE 02/23/2018 0135   BILIRUBINUR Negative 03/25/2014 1100   KETONESUR 5 (A) 02/23/2018  0135   PROTEINUR NEGATIVE 02/23/2018 0135   UROBILINOGEN 0.2 07/17/2008 0956   NITRITE NEGATIVE 02/23/2018 0135   LEUKOCYTESUR NEGATIVE 02/23/2018 0135   LEUKOCYTESUR Negative 03/25/2014 1100   Sepsis Labs: @LABRCNTIP (procalcitonin:4,lacticidven:4) Microbiology: No results found for this or any previous visit (from the past 240 hour(s)).    Radiology Studies last 3 days: ECHOCARDIOGRAM COMPLETE  Result Date: 06/30/2022    ECHOCARDIOGRAM REPORT   Patient Name:   JACOB JILANI Court [facility] Date of Exam: 06/30/2022 Medical Rec #:  [id number]                Height:       71.0 in Accession #:    [id number]               Weight:       155.0 lb Date of Birth:  December 16, 1970                 BSA:          1.892 m Patient Age:    51 years                 BP:           132/84 mmHg Patient Gender: M                        HR:           75 bpm. Exam Location:  [facility] Procedure: 2D Echo, Cardiac Doppler and Color Doppler Indications:     Syncope R55  History:         Patient has prior history of Echocardiogram examinations, most                  recent 04/07/2022. Stroke; Risk Factors:Hypertension. Alcohol                  abuse.  Sonographer:     Cristela Blue Referring Phys:  [id number] ASIA B ZIERLE-GHOSH Diagnosing Phys: Julien Nordmann MD IMPRESSIONS  1. Left ventricular ejection fraction, by estimation, is 60  to 65%. The left ventricle has normal function. The left ventricle has no regional wall motion abnormalities. Left ventricular diastolic parameters were normal.  2. Right ventricular systolic function is normal. The right ventricular size is normal. There is normal pulmonary artery systolic pressure. The estimated right ventricular systolic pressure is 20.5 mmHg.  3. The mitral valve is normal in structure. No evidence of mitral valve regurgitation. No evidence of mitral stenosis.  4. The aortic valve is normal in structure. Aortic valve regurgitation is not visualized. No aortic stenosis is present.  5. The inferior vena  cava is normal in size with greater than 50% respiratory variability, suggesting right atrial pressure of 3 mmHg. FINDINGS  Left Ventricle: Left ventricular ejection fraction, by estimation, is 60 to 65%. The left ventricle has normal function. The left ventricle has no regional wall motion abnormalities. The left ventricular internal cavity size was normal in size. There is  no left ventricular hypertrophy. Left ventricular diastolic parameters were normal. Right Ventricle: The right ventricular size is normal. No increase in right ventricular wall thickness. Right ventricular systolic function is normal. There is normal pulmonary artery systolic pressure. The tricuspid regurgitant velocity is 2.09 m/s, and  with an assumed right atrial pressure of 3 mmHg, the estimated right ventricular systolic pressure is 20.5 mmHg. Left Atrium: Left atrial size was normal in size. Right Atrium: Right atrial size was normal in size. Pericardium: There is no evidence of pericardial effusion. Mitral Valve: The mitral valve is normal in structure. No evidence of mitral valve regurgitation. No evidence of mitral valve stenosis. Tricuspid Valve: The tricuspid valve is normal in structure. Tricuspid valve regurgitation is mild . No evidence of tricuspid stenosis. Aortic Valve: The aortic valve is normal in structure. Aortic valve regurgitation is not visualized. No aortic stenosis is present. Aortic valve mean gradient measures 3.0 mmHg. Aortic valve peak gradient measures 4.8 mmHg. Aortic valve area, by VTI measures 2.95 cm. Pulmonic Valve: The pulmonic valve was normal in structure. Pulmonic valve regurgitation is not visualized. No evidence of pulmonic stenosis. Aorta: The aortic root is normal in size and structure. Venous: The inferior vena cava is normal in size with greater than 50% respiratory variability, suggesting right atrial pressure of 3 mmHg. IAS/Shunts: No atrial level shunt detected by color flow Doppler.  LEFT  VENTRICLE PLAX 2D LVIDd:         4.50 cm   Diastology LVIDs:         2.70 cm   LV e' medial:    11.20 cm/s LV PW:         0.90 cm   LV E/e' medial:  7.9 LV IVS:        0.80 cm   LV e' lateral:   6.31 cm/s LVOT diam:     2.00 cm   LV E/e' lateral: 13.9 LV SV:         69 LV SV Index:   37 LVOT Area:     3.14 cm  RIGHT VENTRICLE RV Basal diam:  4.60 cm RV Mid diam:    3.70 cm RV S prime:     12.20 cm/s TAPSE (M-mode): 2.0 cm LEFT ATRIUM             Index        RIGHT ATRIUM           Index LA diam:        2.90 cm 1.53 cm/m   RA Area:  12.80 cm LA Vol (A2C):   44.5 ml 23.52 ml/m  RA Volume:   28.90 ml  15.27 ml/m LA Vol (A4C):   22.0 ml 11.63 ml/m LA Biplane Vol: 31.3 ml 16.54 ml/m  AORTIC VALVE AV Area (Vmax):    2.62 cm AV Area (Vmean):   2.30 cm AV Area (VTI):     2.95 cm AV Vmax:           110.00 cm/s AV Vmean:          82.100 cm/s AV VTI:            0.234 m AV Peak Grad:      4.8 mmHg AV Mean Grad:      3.0 mmHg LVOT Vmax:         91.80 cm/s LVOT Vmean:        60.000 cm/s LVOT VTI:          0.220 m LVOT/AV VTI ratio: 0.94  AORTA Ao Root diam: 3.10 cm MITRAL VALVE               TRICUSPID VALVE MV Area (PHT): 4.49 cm    TR Peak grad:   17.5 mmHg MV Decel Time: 169 msec    TR Vmax:        209.00 cm/s MV E velocity: 88.00 cm/s MV A velocity: 60.80 cm/s  SHUNTS MV E/A ratio:  1.45        Systemic VTI:  0.22 m                            Systemic Diam: 2.00 cm Julien Nordmann MD Electronically signed by Julien Nordmann MD Signature Date/Time: 06/30/2022/12:29:25 PM    Final    US Abdomen Limited RUQ (LIVER/GB)  Result Date: 06/29/2022 CLINICAL DATA:  Transaminitis. EXAM: ULTRASOUND ABDOMEN LIMITED RIGHT UPPER QUADRANT COMPARISON:  April 07, 2022 FINDINGS: Gallbladder: No gallstones or wall thickening visualized (1.7 mm). No sonographic Murphy sign noted by sonographer. Common bile duct: Diameter: 2.0 mm Liver: No focal lesion identified. Diffusely increased echogenicity of the liver parenchyma is noted.  This is seen on the prior exam. Portal vein is patent on color Doppler imaging with normal direction of blood flow towards the liver. Other: None. IMPRESSION: Hepatic steatosis without focal liver lesions. Electronically Signed   By: Aram Candela M.D.   On: 06/29/2022 20:56   CT ABDOMEN PELVIS W CONTRAST  Result Date: 06/29/2022 CLINICAL DATA:  Generalized weakness. Abdominal pain. Unresponsiveness. EXAM: CT ABDOMEN AND PELVIS WITH CONTRAST TECHNIQUE: Multidetector CT imaging of the abdomen and pelvis was performed using the standard protocol following bolus administration of intravenous contrast. RADIATION DOSE REDUCTION: This exam was performed according to the departmental dose-optimization program which includes automated exposure control, adjustment of the mA and/or kV according to patient size and/or use of iterative reconstruction technique. CONTRAST:  OMNIPAQUE IOHEXOL 300 MG/ML  SOLN COMPARISON:  Abdominal ultrasound 04/07/2022 and CT abdomen and pelvis 03/25/2014 FINDINGS: Lower chest: No acute abnormality. Hepatobiliary: Marked hepatic steatosis. Unremarkable gallbladder and biliary tree. Pancreas: Unremarkable. Spleen: Unremarkable. Adrenals/Urinary Tract: Normal adrenal glands. No urinary calculi or hydronephrosis. Unremarkable bladder. Stomach/Bowel: Colonic diverticulosis without diverticulitis. Diffuse fatty infiltration of the colon MVA normal finding or due to sequela of chronic inflammation. No evidence of active inflammation. Normal appendix. Stomach is within normal limits. Vascular/Lymphatic: Mild aortic atherosclerotic calcification. No lymphadenopathy. Reproductive: Unremarkable. Other: No free intraperitoneal fluid or air. Musculoskeletal: No acute fracture.  IMPRESSION: 1. No acute findings. 2. Marked hepatic steatosis. 3. Colonic diverticulosis. Aortic Atherosclerosis (ICD10-I70.0). Electronically Signed   By: Minerva Fester M.D.   On: 06/29/2022 20:15              LOS: 2 days       Sunnie Nielsen, DO [facility] 07/02/2022, 12:41 PM    Dictation software may have been used to generate the above note. Typos may occur and escape review in typed/dictated notes. Please contact Dr Lyn Hollingshead directly for clarity if needed.  Staff may message me via secure chat in Epic  but this may not receive an immediate response,  please page me for urgent matters!  If 7PM-7AM, please contact night coverage [email or web address]

## 2022-07-03 ENCOUNTER — Encounter: Payer: Self-pay | Admitting: Family Medicine

## 2022-07-03 DIAGNOSIS — E860 Dehydration: Secondary | ICD-10-CM | POA: Diagnosis not present

## 2022-07-03 DIAGNOSIS — Z125 Encounter for screening for malignant neoplasm of prostate: Secondary | ICD-10-CM | POA: Insufficient documentation

## 2022-07-03 DIAGNOSIS — K7010 Alcoholic hepatitis without ascites: Secondary | ICD-10-CM | POA: Diagnosis not present

## 2022-07-03 DIAGNOSIS — N179 Acute kidney failure, unspecified: Secondary | ICD-10-CM | POA: Diagnosis not present

## 2022-07-03 DIAGNOSIS — R1319 Other dysphagia: Secondary | ICD-10-CM | POA: Diagnosis not present

## 2022-07-03 DIAGNOSIS — E785 Hyperlipidemia, unspecified: Secondary | ICD-10-CM | POA: Insufficient documentation

## 2022-07-03 DIAGNOSIS — R634 Abnormal weight loss: Secondary | ICD-10-CM | POA: Insufficient documentation

## 2022-07-03 DIAGNOSIS — Z1211 Encounter for screening for malignant neoplasm of colon: Secondary | ICD-10-CM | POA: Insufficient documentation

## 2022-07-03 DIAGNOSIS — Z8673 Personal history of transient ischemic attack (TIA), and cerebral infarction without residual deficits: Secondary | ICD-10-CM | POA: Insufficient documentation

## 2022-07-03 DIAGNOSIS — F10288 Alcohol dependence with other alcohol-induced disorder: Secondary | ICD-10-CM | POA: Diagnosis not present

## 2022-07-03 HISTORY — DX: Hyperlipidemia, unspecified: E78.5

## 2022-07-03 LAB — COMPREHENSIVE METABOLIC PANEL
ALT: 114 U/L — ABNORMAL HIGH (ref 0–44)
AST: 169 U/L — ABNORMAL HIGH (ref 15–41)
Albumin: 3.5 g/dL (ref 3.5–5.0)
Alkaline Phosphatase: 70 U/L (ref 38–126)
Anion gap: 6 (ref 5–15)
BUN: 5 mg/dL — ABNORMAL LOW (ref 6–20)
CO2: 28 mmol/L (ref 22–32)
Calcium: 8.9 mg/dL (ref 8.9–10.3)
Chloride: 100 mmol/L (ref 98–111)
Creatinine, Ser: 0.68 mg/dL (ref 0.61–1.24)
GFR, Estimated: >60 mL/min (ref >60)
Glucose, Bld: 114 mg/dL — ABNORMAL HIGH (ref 70–99)
Potassium: 3.3 mmol/L — ABNORMAL LOW (ref 3.5–5.1)
Sodium: 134 mmol/L — ABNORMAL LOW (ref 135–145)
Total Bilirubin: 1.8 mg/dL — ABNORMAL HIGH (ref 0.3–1.2)
Total Protein: 6.6 g/dL (ref 6.5–8.1)

## 2022-07-03 LAB — PHOSPHORUS: Phosphorus: 2.4 mg/dL — ABNORMAL LOW (ref 2.5–4.6)

## 2022-07-03 LAB — MAGNESIUM: Magnesium: 1.5 mg/dL — ABNORMAL LOW (ref 1.7–2.4)

## 2022-07-03 NOTE — Assessment & Plan Note (Signed)
Chronic.  Likely secondary to EtOH use Check labs

## 2022-07-03 NOTE — Assessment & Plan Note (Signed)
Chronic use of alcohol since age 52.  Drinks 1/5 of alcohol daily. Offered referral for rehab, patient declined due to for finances. Check CBC, ferritin, vitamin B1, folate, magnesium, phosphate given history  Recommend limiting alcohol use

## 2022-07-03 NOTE — Assessment & Plan Note (Signed)
Chronic.  Has established relationship with GI Recheck LFTs Recommend follow-up with GI for continued evaluation.

## 2022-07-03 NOTE — Assessment & Plan Note (Signed)
Asymptomatic. Check PSA

## 2022-07-03 NOTE — Assessment & Plan Note (Signed)
Referral sent for colon cancer screening

## 2022-07-03 NOTE — Assessment & Plan Note (Signed)
Patient denied any seizure activity. Review of chart indicates admission for seizure-like activity. EEG did not show any seizure-like activity at that time. Has not had any recent episodes. Suspect EtOH contributing to seizure-like activity

## 2022-07-03 NOTE — Assessment & Plan Note (Signed)
Chronic.  Unintentional 20 pound weight loss in the last year.  Has not had EGD or recent colonoscopy.  Was previously seen by Dr. Allegra Lai 11/23 for alcoholic hepatitis.  No family history of colon cancer.  No smoking history.  Intermittent use of THC edibles and daily alcohol use. Referral sent for colonoscopy and EGD.   Have requested the patient call Dr. Verdis Prime office to request an appointment. Check TSH, CBC, CMET, A1c today.

## 2022-07-03 NOTE — Assessment & Plan Note (Signed)
History of CVA.  Recent admission to [facility].  CT showing new foci of mild hypodensity right corona radiata and basal ganglia new since last imaging.  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 No deficits on exam today. Has not had follow-up with neurology since discharge from [facility]. Blood pressure well-controlled. Not currently on statin secondary to elevated LFTs  On aspirin therapy Referral sent to neurology

## 2022-07-03 NOTE — Assessment & Plan Note (Addendum)
Chronic.  Well-controlled on current antihypertensive however given chronic cough will switch to ARB Discontinue lisinopril  Start losartan 25 mg daily Recommend EtOH cessation Check c-Met

## 2022-07-03 NOTE — Assessment & Plan Note (Deleted)
History of CVA.  Recent admission to [facility].  CT showing new foci of mild hypodensity right corona radiata and basal ganglia new since last imaging.  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 No deficits on exam today. Has not had follow-up with neurology since discharge from [facility]. Blood pressure well-controlled. Not currently on statin secondary to elevated LFTs  On aspirin therapy Referral sent to neurology 

## 2022-07-03 NOTE — Assessment & Plan Note (Signed)
Chronic. Not currently on statin therapy secondary to elevated LFTs Check fasting lipids.

## 2022-07-03 NOTE — Progress Notes (Signed)
PROGRESS NOTE    Levi Mejia [facility]   ZOX:[id number] DOB: 1971-01-10  DOA: 06/29/2022 Date of Service: 07/03/22 PCP: Dana Allan, MD     Brief Narrative / Hospital Course:  Levi Mejia is a 52 y.o. male with medical history significant of alcohol abuse, ADHD, HTN, presents to the ED with a chief complaint of nausea and vomiting x4d. Dysphagia, cannot tolerate solids, scheduled for esophageal dilation. (+)EtOH use w/ hx seizure in the past. Last EtOH consumption 04/30, typical intake half a bottle of hard liquor a day  05/01: to ED, CT abdomen pelvis no acute findings but marked hepatic steatosis, elevated AST/ALT to 282/122 and Tbili 2.0 question alcoholic hepatitis. Admitted for AKI, CIWA protocol, IV fluids 05/02: Cr normalized, AST/ALT improved, lactate WNL. Pt is motivated to stop drinking, will keep inpatient for withdrawal monitoring. D/w GI, holding off on consult for now unless he is not able to tolerate full liquid diet. Valium 5 mg po q6h + prn breakthrough based on CIWA 05/03: AST/ALT increased. CIWA stable around 5 compared to 10 on admission. Valium 5 mg po q8h + prn breakthrough based on CIWA 05/04: low CIWA. Valium to q12h later today. If stable tomorrow, will be outside window for DT and can potentially discharge home unless GI planning on procedure. Per Dr Servando Snare (GI) "Monday afternoon then there is opening in my schedule for Monday afternoon to do a procedure on him as an inpatient if he is still here for other reasons otherwise he should follow-up with Dr. Allegra Lai on Tuesday." 05/05: stable, no tremor or anxiety, CIWA 0, await EGD tomorrow.   Consultants:  none  Procedures: none      ASSESSMENT & PLAN:   Principal Problem:   Alcoholic hepatitis Active Problems:   Alcohol dependence (HCC)   Syncope, vasovagal   AKI (acute kidney injury) (HCC)   Lactic acidosis   Dehydration   Alcohol withdrawal (HCC)   Protein-calorie malnutrition,  severe   Alcohol dependence (HCC) Had alcoholic seizure last year when trying to quit Has not had a drink in about 9 hours and already feels tremulous CIWA protocol Scheduled benzodiazepine taper Prn benzodiazepine for breakthrough symptoms   AKI (acute kidney injury) (HCC) - resolved  Dehydration - improved  Po fluids  Monitor BMP   Syncope, vasovagal Patient had a syncopal episode after nausea and vomiting With a history of alcohol dependence and recent abstinence from alcohol continue seizure precautions echo for completeness --> no concerns   Dysphagia to solids EGD planned for tomorrow afternoon w/ Dr Servando Snare Full liquid diet tolerated thus far   Alcoholic hepatitis Transaminitis  Hepatic steatosis without cirrhosis Acute hepatitis panel for completeness --> NR Hold hepatotoxic agents when possible Will need GI follow-up   Lactic acidosis - resolved Likely d/t dehydration Check as needed      DVT prophylaxis: lovenox  Pertinent IV fluids/nutrition: stopping IV fluids given improvement, encourage po as able Central lines / invasive devices: none  Code Status: FULL CODE ACP documentation reviewed: 06/30/22 none on file in [facility]  Current Admission Status: inpatient   TOC needs / Dispo plan: none at this time Barriers to discharge / significant pending items: EtOH withdrawal and AKI treatment, may be able to d/c tomorrow pending EGD             Subjective / Brief ROS:  Dysphagia to solids and inabitlity to swallow solids - no worse  Denies CP/SOB.  Pain controlled.  Denies new weakness.  Tolerating liquids.  Reports no concerns w/ urination/defecation.   Family Communication: pt declined call to family or other support persons and asked [place] not to give updates to anyone without his permission     Objective Findings:  Vitals:   07/02/22 1632 07/02/22 2015 07/03/22 0458 07/03/22 0842  BP: (!) 124/90 124/86 97/68 122/89  Pulse: 75 81 79 75   Resp: 16 18 16 16   Temp: 98.8 F (37.1 C) 98.3 F (36.8 C) 98 F (36.7 C) 98.8 F (37.1 C)  TempSrc:    Oral  SpO2: 100% 100% 95% 99%  Weight:      Height:        Intake/Output Summary (Last 24 hours) at 07/03/2022 1308 Last data filed at 07/02/2022 1910 Gross per 24 hour  Intake 480 ml  Output --  Net 480 ml   Filed Weights   06/29/22 2127 07/02/22 0412  Weight: 70.3 kg 64.5 kg    Examination:  Physical Exam Constitutional:      General: He is not in acute distress.    Appearance: Normal appearance.  Cardiovascular:     Rate and Rhythm: Normal rate and regular rhythm.  Pulmonary:     Effort: Pulmonary effort is normal.     Breath sounds: Normal breath sounds.  Abdominal:     General: Abdomen is flat. Bowel sounds are normal.     Palpations: Abdomen is soft.  Skin:    General: Skin is warm and dry.  Neurological:     Mental Status: He is alert and oriented to person, place, and time.     Motor: No tremor.          Scheduled Medications:   aspirin EC  81 mg Oral Daily   diazepam  5 mg Oral Q12H   enoxaparin (LOVENOX) injection  40 mg Subcutaneous Q24H   feeding supplement  237 mL Oral TID BM   folic acid  1 mg Oral Daily   multivitamin with minerals  1 tablet Oral Daily   thiamine  100 mg Oral Daily   Or   thiamine  100 mg Intravenous Daily    Continuous Infusions:    PRN Medications:  ondansetron **OR** ondansetron (ZOFRAN) IV, oxyCODONE  Antimicrobials from admission:  Anti-infectives (From admission, onward)    None           Data Reviewed:  I have personally reviewed the following...  CBC: Recent Labs  Lab 06/29/22 1847 06/30/22 0309 07/02/22 0402  WBC 9.9 6.1 4.6  NEUTROABS  --  3.5  --   HGB 15.2 12.2* 12.4*  HCT 40.8 34.0* 35.3*  MCV 99.3 101.8* 102.3*  PLT 228 126* 138*   Basic Metabolic Panel: Recent Labs  Lab 06/29/22 1847 06/30/22 0309 07/01/22 0543 07/02/22 0402 07/03/22 0438  NA 133* 132* 135 134* 134*   K 3.6 3.7 3.4* 3.2* 3.3*  CL 89* 93* 98 97* 100  CO2 22 27 30 28 28   GLUCOSE 116* 82 101* 108* 114*  BUN 10 10 8  5* 5*  CREATININE 1.28* 0.88 0.76 0.65 0.68  CALCIUM 8.9 8.0* 8.0* 8.6* 8.9  MG  --  1.2* 2.1 1.7 1.5*  PHOS  --   --  2.5 2.4* 2.4*   GFR: Estimated Creatinine Clearance: 99.7 mL/min (by C-G formula based on SCr of 0.68 mg/dL). Liver Function Tests: Recent Labs  Lab 06/29/22 1847 06/30/22 0309 07/01/22 0543 07/02/22 0402 07/03/22 0438  AST 282* 196* 323* 315* 169*  ALT 122* 93* 111* 138*  114*  ALKPHOS 71 54 52 69 70  BILITOT 2.0* 2.2* 2.6* 2.6* 1.8*  PROT 8.0 6.4* 6.2* 6.5 6.6  ALBUMIN 4.4 3.6 3.2* 3.4* 3.5   Recent Labs  Lab 06/29/22 1847  LIPASE 40   No results for input(s): "AMMONIA" in the last 168 hours. Coagulation Profile: No results for input(s): "INR", "PROTIME" in the last 168 hours. Cardiac Enzymes: No results for input(s): "CKTOTAL", "CKMB", "CKMBINDEX", "TROPONINI" in the last 168 hours. BNP (last 3 results) No results for input(s): "PROBNP" in the last 8760 hours. HbA1C: No results for input(s): "HGBA1C" in the last 72 hours. CBG: No results for input(s): "GLUCAP" in the last 168 hours. Lipid Profile: No results for input(s): "CHOL", "HDL", "LDLCALC", "TRIG", "CHOLHDL", "LDLDIRECT" in the last 72 hours. Thyroid Function Tests: No results for input(s): "TSH", "T4TOTAL", "FREET4", "T3FREE", "THYROIDAB" in the last 72 hours. Anemia Panel: No results for input(s): "VITAMINB12", "FOLATE", "FERRITIN", "TIBC", "IRON", "RETICCTPCT" in the last 72 hours. Most Recent Urinalysis On File:     Component Value Date/Time   COLORURINE STRAW (A) 02/23/2018 0135   APPEARANCEUR CLEAR (A) 02/23/2018 0135   APPEARANCEUR Clear 03/25/2014 1100   LABSPEC 1.005 02/23/2018 0135   LABSPEC >1.060 03/25/2014 1100   PHURINE 6.0 02/23/2018 0135   GLUCOSEU NEGATIVE 02/23/2018 0135   GLUCOSEU Negative 03/25/2014 1100   HGBUR NEGATIVE 02/23/2018 0135   HGBUR  negative 07/17/2008 0956   BILIRUBINUR NEGATIVE 02/23/2018 0135   BILIRUBINUR Negative 03/25/2014 1100   KETONESUR 5 (A) 02/23/2018 0135   PROTEINUR NEGATIVE 02/23/2018 0135   UROBILINOGEN 0.2 07/17/2008 0956   NITRITE NEGATIVE 02/23/2018 0135   LEUKOCYTESUR NEGATIVE 02/23/2018 0135   LEUKOCYTESUR Negative 03/25/2014 1100   Sepsis Labs: @LABRCNTIP (procalcitonin:4,lacticidven:4) Microbiology: No results found for this or any previous visit (from the past 240 hour(s)).    Radiology Studies last 3 days: ECHOCARDIOGRAM COMPLETE  Result Date: 06/30/2022    ECHOCARDIOGRAM REPORT   Patient Name:   RATHANA HERING [facility] Date of Exam: 06/30/2022 Medical Rec #:  [id number]                Height:       71.0 in Accession #:    [id number]               Weight:       155.0 lb Date of Birth:  [birth year]                 BSA:          1.892 m Patient Age:    51 years                 BP:           132/84 mmHg Patient Gender: M                        HR:           75 bpm. Exam Location:  [facility] Procedure: 2D Echo, Cardiac Doppler and Color Doppler Indications:     Syncope R55  History:         Patient has prior history of Echocardiogram examinations, most                  recent 04/07/2022. Stroke; Risk Factors:Hypertension. Alcohol                  abuse.  Sonographer:     Cristela Blue Referring Phys:  [id number] ASIA  B ZIERLE-GHOSH Diagnosing Phys: Julien Nordmann MD IMPRESSIONS  1. Left ventricular ejection fraction, by estimation, is 60 to 65%. The left ventricle has normal function. The left ventricle has no regional wall motion abnormalities. Left ventricular diastolic parameters were normal.  2. Right ventricular systolic function is normal. The right ventricular size is normal. There is normal pulmonary artery systolic pressure. The estimated right ventricular systolic pressure is 20.5 mmHg.  3. The mitral valve is normal in structure. No evidence of mitral valve regurgitation. No evidence of mitral stenosis.  4. The  aortic valve is normal in structure. Aortic valve regurgitation is not visualized. No aortic stenosis is present.  5. The inferior vena cava is normal in size with greater than 50% respiratory variability, suggesting right atrial pressure of 3 mmHg. FINDINGS  Left Ventricle: Left ventricular ejection fraction, by estimation, is 60 to 65%. The left ventricle has normal function. The left ventricle has no regional wall motion abnormalities. The left ventricular internal cavity size was normal in size. There is  no left ventricular hypertrophy. Left ventricular diastolic parameters were normal. Right Ventricle: The right ventricular size is normal. No increase in right ventricular wall thickness. Right ventricular systolic function is normal. There is normal pulmonary artery systolic pressure. The tricuspid regurgitant velocity is 2.09 m/s, and  with an assumed right atrial pressure of 3 mmHg, the estimated right ventricular systolic pressure is 20.5 mmHg. Left Atrium: Left atrial size was normal in size. Right Atrium: Right atrial size was normal in size. Pericardium: There is no evidence of pericardial effusion. Mitral Valve: The mitral valve is normal in structure. No evidence of mitral valve regurgitation. No evidence of mitral valve stenosis. Tricuspid Valve: The tricuspid valve is normal in structure. Tricuspid valve regurgitation is mild . No evidence of tricuspid stenosis. Aortic Valve: The aortic valve is normal in structure. Aortic valve regurgitation is not visualized. No aortic stenosis is present. Aortic valve mean gradient measures 3.0 mmHg. Aortic valve peak gradient measures 4.8 mmHg. Aortic valve area, by VTI measures 2.95 cm. Pulmonic Valve: The pulmonic valve was normal in structure. Pulmonic valve regurgitation is not visualized. No evidence of pulmonic stenosis. Aorta: The aortic root is normal in size and structure. Venous: The inferior vena cava is normal in size with greater than 50%  respiratory variability, suggesting right atrial pressure of 3 mmHg. IAS/Shunts: No atrial level shunt detected by color flow Doppler.  LEFT VENTRICLE PLAX 2D LVIDd:         4.50 cm   Diastology LVIDs:         2.70 cm   LV e' medial:    11.20 cm/s LV PW:         0.90 cm   LV E/e' medial:  7.9 LV IVS:        0.80 cm   LV e' lateral:   6.31 cm/s LVOT diam:     2.00 cm   LV E/e' lateral: 13.9 LV SV:         69 LV SV Index:   37 LVOT Area:     3.14 cm  RIGHT VENTRICLE RV Basal diam:  4.60 cm RV Mid diam:    3.70 cm RV S prime:     12.20 cm/s TAPSE (M-mode): 2.0 cm LEFT ATRIUM             Index        RIGHT ATRIUM           Index LA diam:  2.90 cm 1.53 cm/m   RA Area:     12.80 cm LA Vol (A2C):   44.5 ml 23.52 ml/m  RA Volume:   28.90 ml  15.27 ml/m LA Vol (A4C):   22.0 ml 11.63 ml/m LA Biplane Vol: 31.3 ml 16.54 ml/m  AORTIC VALVE AV Area (Vmax):    2.62 cm AV Area (Vmean):   2.30 cm AV Area (VTI):     2.95 cm AV Vmax:           110.00 cm/s AV Vmean:          82.100 cm/s AV VTI:            0.234 m AV Peak Grad:      4.8 mmHg AV Mean Grad:      3.0 mmHg LVOT Vmax:         91.80 cm/s LVOT Vmean:        60.000 cm/s LVOT VTI:          0.220 m LVOT/AV VTI ratio: 0.94  AORTA Ao Root diam: 3.10 cm MITRAL VALVE               TRICUSPID VALVE MV Area (PHT): 4.49 cm    TR Peak grad:   17.5 mmHg MV Decel Time: 169 msec    TR Vmax:        209.00 cm/s MV E velocity: 88.00 cm/s MV A velocity: 60.80 cm/s  SHUNTS MV E/A ratio:  1.45        Systemic VTI:  0.22 m                            Systemic Diam: 2.00 cm Julien Nordmann MD Electronically signed by Julien Nordmann MD Signature Date/Time: 06/30/2022/12:29:25 PM    Final    US Abdomen Limited RUQ (LIVER/GB)  Result Date: 06/29/2022 CLINICAL DATA:  Transaminitis. EXAM: ULTRASOUND ABDOMEN LIMITED RIGHT UPPER QUADRANT COMPARISON:  April 07, 2022 FINDINGS: Gallbladder: No gallstones or wall thickening visualized (1.7 mm). No sonographic Murphy sign noted by sonographer.  Common bile duct: Diameter: 2.0 mm Liver: No focal lesion identified. Diffusely increased echogenicity of the liver parenchyma is noted. This is seen on the prior exam. Portal vein is patent on color Doppler imaging with normal direction of blood flow towards the liver. Other: None. IMPRESSION: Hepatic steatosis without focal liver lesions. Electronically Signed   By: Aram Candela M.D.   On: 06/29/2022 20:56   CT ABDOMEN PELVIS W CONTRAST  Result Date: 06/29/2022 CLINICAL DATA:  Generalized weakness. Abdominal pain. Unresponsiveness. EXAM: CT ABDOMEN AND PELVIS WITH CONTRAST TECHNIQUE: Multidetector CT imaging of the abdomen and pelvis was performed using the standard protocol following bolus administration of intravenous contrast. RADIATION DOSE REDUCTION: This exam was performed according to the departmental dose-optimization program which includes automated exposure control, adjustment of the mA and/or kV according to patient size and/or use of iterative reconstruction technique. CONTRAST:  OMNIPAQUE IOHEXOL 300 MG/ML  SOLN COMPARISON:  Abdominal ultrasound 04/07/2022 and CT abdomen and pelvis 03/25/2014 FINDINGS: Lower chest: No acute abnormality. Hepatobiliary: Marked hepatic steatosis. Unremarkable gallbladder and biliary tree. Pancreas: Unremarkable. Spleen: Unremarkable. Adrenals/Urinary Tract: Normal adrenal glands. No urinary calculi or hydronephrosis. Unremarkable bladder. Stomach/Bowel: Colonic diverticulosis without diverticulitis. Diffuse fatty infiltration of the colon MVA normal finding or due to sequela of chronic inflammation. No evidence of active inflammation. Normal appendix. Stomach is within normal limits. Vascular/Lymphatic: Mild aortic atherosclerotic calcification. No lymphadenopathy. Reproductive:  Unremarkable. Other: No free intraperitoneal fluid or air. Musculoskeletal: No acute fracture. IMPRESSION: 1. No acute findings. 2. Marked hepatic steatosis. 3. Colonic  diverticulosis. Aortic Atherosclerosis (ICD10-I70.0). Electronically Signed   By: Minerva Fester M.D.   On: 06/29/2022 20:15             LOS: 3 days       Sunnie Nielsen, DO [facility] 07/03/2022, 1:08 PM    Dictation software may have been used to generate the above note. Typos may occur and escape review in typed/dictated notes. Please contact Dr Lyn Hollingshead directly for clarity if needed.  Staff may message me via secure chat in Epic  but this may not receive an immediate response,  please page me for urgent matters!  If 7PM-7AM, please contact night coverage [email or web address]

## 2022-07-03 NOTE — Assessment & Plan Note (Signed)
Recent hospitalization showed hypomagnesemia. Check magnesium Mag 1.3. Referral sent to infusion center t for infusion of IV magnesium 4 g as unable to tolerate p.o. secondary to dysphagia

## 2022-07-03 NOTE — Progress Notes (Signed)
    Midge Minium, MD [facility]   [address], [address] [place], [place] [address] Phone: [phone] Fax : [phone]   Subjective: The patient has chronic dysphagia with worsening over the last few months.  The patient is in the hospital now for alcohol withdrawal.  The patient would like to proceed with an upper endoscopy while in the hospital.   Objective: Vital signs in last 24 hours: Vitals:   07/02/22 1632 07/02/22 2015 07/03/22 0458 07/03/22 0842  BP: (!) 124/90 124/86 97/68 122/89  Pulse: 75 81 79 75  Resp: 16 18 16 16   Temp: 98.8 F (37.1 C) 98.3 F (36.8 C) 98 F (36.7 C) 98.8 F (37.1 C)  TempSrc:    Oral  SpO2: 100% 100% 95% 99%  Weight:      Height:       Weight change:   Intake/Output Summary (Last 24 hours) at 07/03/2022 1202 Last data filed at 07/02/2022 1910 Gross per 24 hour  Intake 480 ml  Output --  Net 480 ml     Exam: General: Patient is alert and awake and walking around the department when I saw him.  He is in no apparent distress   Lab Results: @LABTEST2 @ Micro Results: No results found for this or any previous visit (from the past 240 hour(s)). Studies/Results: No results found. Medications: I have reviewed the patient's current medications. Scheduled Meds:  aspirin EC  81 mg Oral Daily   diazepam  5 mg Oral Q12H   enoxaparin (LOVENOX) injection  40 mg Subcutaneous Q24H   feeding supplement  237 mL Oral TID BM   folic acid  1 mg Oral Daily   multivitamin with minerals  1 tablet Oral Daily   thiamine  100 mg Oral Daily   Or   thiamine  100 mg Intravenous Daily   Continuous Infusions: PRN Meds:.ondansetron **OR** ondansetron (ZOFRAN) IV, oxyCODONE   Assessment: Principal Problem:   Alcoholic hepatitis Active Problems:   Syncope, vasovagal   Alcohol dependence (HCC)   AKI (acute kidney injury) (HCC)   Lactic acidosis   Dehydration   Alcohol withdrawal (HCC)   Protein-calorie malnutrition, severe   Nausea and vomiting    Esophageal dysphagia    Plan: This patient has dysphagia and was to see GI later this week.  The patient will be kept in the hospital for an upper endoscopy for tomorrow.  He will be kept n.p.o. at around noon tomorrow and will be kept on a clear liquid diet until then.  I have discussed the case with his hospitalist and she agrees.   LOS: 3 days   Sherlyn Hay 07/03/2022, 12:02 PM Pager [phone] 7am-5pm  Check AMION for 5pm -7am coverage and on weekends

## 2022-07-04 ENCOUNTER — Encounter: Payer: Self-pay | Admitting: Osteopathic Medicine

## 2022-07-04 ENCOUNTER — Inpatient Hospital Stay: Payer: PRIVATE HEALTH INSURANCE | Admitting: Anesthesiology

## 2022-07-04 ENCOUNTER — Encounter
Admission: EM | Disposition: A | Discharge status: Home / Self Care | Payer: Self-pay | Source: Home / Self Care | Attending: Osteopathic Medicine

## 2022-07-04 DIAGNOSIS — R1319 Other dysphagia: Secondary | ICD-10-CM | POA: Diagnosis not present

## 2022-07-04 DIAGNOSIS — K7010 Alcoholic hepatitis without ascites: Secondary | ICD-10-CM | POA: Diagnosis not present

## 2022-07-04 DIAGNOSIS — F10288 Alcohol dependence with other alcohol-induced disorder: Secondary | ICD-10-CM | POA: Diagnosis not present

## 2022-07-04 DIAGNOSIS — R112 Nausea with vomiting, unspecified: Secondary | ICD-10-CM | POA: Diagnosis not present

## 2022-07-04 DIAGNOSIS — E43 Unspecified severe protein-calorie malnutrition: Secondary | ICD-10-CM

## 2022-07-04 DIAGNOSIS — N179 Acute kidney failure, unspecified: Secondary | ICD-10-CM | POA: Diagnosis not present

## 2022-07-04 HISTORY — PX: ESOPHAGOGASTRODUODENOSCOPY (EGD) WITH PROPOFOL: SHX5813

## 2022-07-04 LAB — MAGNESIUM: Magnesium: 1.5 mg/dL — ABNORMAL LOW (ref 1.7–2.4)

## 2022-07-04 LAB — COMPREHENSIVE METABOLIC PANEL
ALT: 95 U/L — ABNORMAL HIGH (ref 0–44)
AST: 119 U/L — ABNORMAL HIGH (ref 15–41)
Albumin: 3.5 g/dL (ref 3.5–5.0)
Alkaline Phosphatase: 63 U/L (ref 38–126)
Anion gap: 12 (ref 5–15)
BUN: 9 mg/dL (ref 6–20)
CO2: 25 mmol/L (ref 22–32)
Calcium: 9.4 mg/dL (ref 8.9–10.3)
Chloride: 98 mmol/L (ref 98–111)
Creatinine, Ser: 0.85 mg/dL (ref 0.61–1.24)
GFR, Estimated: >60 mL/min (ref >60)
Glucose, Bld: 107 mg/dL — ABNORMAL HIGH (ref 70–99)
Potassium: 3.9 mmol/L (ref 3.5–5.1)
Sodium: 135 mmol/L (ref 135–145)
Total Bilirubin: 1.4 mg/dL — ABNORMAL HIGH (ref 0.3–1.2)
Total Protein: 6.9 g/dL (ref 6.5–8.1)

## 2022-07-04 LAB — PHOSPHORUS: Phosphorus: 1.9 mg/dL — ABNORMAL LOW (ref 2.5–4.6)

## 2022-07-04 SURGERY — ESOPHAGOGASTRODUODENOSCOPY (EGD) WITH PROPOFOL
Anesthesia: General

## 2022-07-04 MED ORDER — LIDOCAINE HCL (CARDIAC) PF 100 MG/5ML IV SOSY
PREFILLED_SYRINGE | INTRAVENOUS | Status: DC | PRN
  Administered 2022-07-04: 100 mg via INTRAVENOUS

## 2022-07-04 MED ORDER — FOLIC ACID 1 MG PO TABS: 1.0000 mg | ORAL_TABLET | Freq: Every day | ORAL | 0 refills | Status: DC

## 2022-07-04 MED ORDER — VITAMIN B-1 100 MG PO TABS: 100.0000 mg | ORAL_TABLET | Freq: Every day | ORAL | 0 refills | Status: DC

## 2022-07-04 MED ORDER — SODIUM CHLORIDE 0.9 % IV SOLN: INTRAVENOUS | Status: DC | PRN

## 2022-07-04 MED ORDER — PROPOFOL 10 MG/ML IV BOLUS
INTRAVENOUS | Status: DC | PRN
  Administered 2022-07-04: 20 mg via INTRAVENOUS
  Administered 2022-07-04 (×2): 30 mg via INTRAVENOUS
  Administered 2022-07-04: 20 mg via INTRAVENOUS
  Administered 2022-07-04: 120 mg via INTRAVENOUS

## 2022-07-04 MED ORDER — PANTOPRAZOLE SODIUM 40 MG PO TBEC: 40.0000 mg | DELAYED_RELEASE_TABLET | Freq: Two times a day (BID) | ORAL | Status: DC

## 2022-07-04 MED ORDER — PANTOPRAZOLE SODIUM 40 MG PO TBEC: 40.0000 mg | DELAYED_RELEASE_TABLET | Freq: Two times a day (BID) | ORAL | 0 refills | Status: DC

## 2022-07-04 NOTE — Op Note (Signed)
[facility] Patient Name: Levi Mejia Procedure Date: 07/04/2022 10:57 AM MRN: [id number] Account #: [id number] Date of Birth: 11-22-1970 Admit Type: Inpatient Age: 52 Room: [facility] ENDO ROOM 4 Gender: Male Note Status: Finalized Instrument Name: Upper Endoscope [id number] Procedure:             Upper GI endoscopy Indications:           Esophageal dysphagia Providers:             Toney Reil MD, MD Referring MD:          Dana Allan (Referring MD) Medicines:             General Anesthesia Complications:         No immediate complications. Estimated blood loss: None. Procedure:             Pre-Anesthesia Assessment:                        - Prior to the procedure, a History and Physical was                         performed, and patient medications and allergies were                         reviewed. The patient is competent. The risks and                         benefits of the procedure and the sedation options and                         risks were discussed with the patient. All questions                         were answered and informed consent was obtained.                         Patient identification and proposed procedure were                         verified by the physician, the nurse, the                         anesthesiologist, the anesthetist and the technician                         in the pre-procedure area in the procedure room in the                         endoscopy suite. Mental Status Examination: alert and                         oriented. Airway Examination: normal oropharyngeal                         airway and neck mobility. Respiratory Examination:                         clear to auscultation. CV Examination: normal.  Prophylactic Antibiotics: The patient does not require                         prophylactic antibiotics. Prior Anticoagulants: The                         patient has taken no  anticoagulant or antiplatelet                         agents. ASA Grade Assessment: III - A patient with                         severe systemic disease. After reviewing the risks and                         benefits, the patient was deemed in satisfactory                         condition to undergo the procedure. The anesthesia                         plan was to use general anesthesia. Immediately prior                         to administration of medications, the patient was                         re-assessed for adequacy to receive sedatives. The                         heart rate, respiratory rate, oxygen saturations,                         blood pressure, adequacy of pulmonary ventilation, and                         response to care were monitored throughout the                         procedure. The physical status of the patient was                         re-assessed after the procedure.                        After obtaining informed consent, the endoscope was                         passed under direct vision. Throughout the procedure,                         the patient's blood pressure, pulse, and oxygen                         saturations were monitored continuously. The Endoscope                         was introduced through the mouth, and advanced to the  second part of duodenum. The upper GI endoscopy was                         accomplished without difficulty. The patient tolerated                         the procedure well. Findings:      One benign-appearing, intrinsic severe (stenosis; an endoscope cannot       pass) stenosis was found at the gastroesophageal junction. The stenosis       was traversed after dilation. The dilation site was examined following       endoscope reinsertion and showed moderate mucosal disruption and       moderate improvement in luminal narrowing. Estimated blood loss was       minimal.      The entire examined  stomach was normal.      The cardia and gastric fundus were normal on retroflexion.      The duodenal bulb and second portion of the duodenum were normal. Impression:            - Benign-appearing esophageal stenosis.                        - Normal stomach.                        - Normal duodenal bulb and second portion of the                         duodenum.                        - No specimens collected. Recommendation:        - Return patient to hospital ward for possible                         discharge same day.                        - Chopped diet and mechanical soft diet.                        - Continue present medications.                        - Use Prilosec (omeprazole) 40 mg PO daily                         indefinitely.                        - Repeat upper endoscopy in 1 month for retreatment.                        - Return to my office in 1 month. Procedure Code(s):     --- Professional ---                        [phone], Esophagogastroduodenoscopy, flexible,                         transoral; diagnostic, including collection of  specimen(s) by brushing or washing, when performed                         (separate procedure) Diagnosis Code(s):     --- Professional ---                        K22.2, Esophageal obstruction                        R13.14, Dysphagia, pharyngoesophageal phase CPT copyright 2022 American Medical Association. All rights reserved. The codes documented in this report are preliminary and upon coder review may  be revised to meet current compliance requirements. Dr. Libby Maw Toney Reil MD, MD 07/04/2022 11:15:43 AM This report has been signed electronically. Number of Addenda: 0 Note Initiated On: 07/04/2022 10:57 AM Estimated Blood Loss:  Estimated blood loss: none.      [facility]

## 2022-07-04 NOTE — Anesthesia Postprocedure Evaluation (Signed)
Anesthesia Post Note  Patient: Shonnon Mcfarlen [facility]  Procedure(s) Performed: ESOPHAGOGASTRODUODENOSCOPY (EGD) WITH PROPOFOL  Patient location during evaluation: Endoscopy Anesthesia Type: General Level of consciousness: awake and alert Pain management: pain level controlled Vital Signs Assessment: post-procedure vital signs reviewed and stable Respiratory status: spontaneous breathing, nonlabored ventilation, respiratory function stable and patient connected to nasal cannula oxygen Cardiovascular status: blood pressure returned to baseline and stable Postop Assessment: no apparent nausea or vomiting Anesthetic complications: no   No notable events documented.   Last Vitals:  Vitals:   07/04/22 0804 07/04/22 1117  BP: 98/75   Pulse: 80   Resp: 16   Temp: 36.8 C 36.7 C  SpO2: 96%     Last Pain:  Vitals:   07/04/22 1137  TempSrc:   PainSc: 0-No pain                 Cleda Mccreedy Kayshaun Polanco

## 2022-07-04 NOTE — Discharge Instructions (Signed)
                  Intensive Outpatient Programs  [facility] [place] [facility]    [facility] [address]     [address] [place],  [place]     [place], [place] [phone]      [phone]  [facility]   [facility]  (Inpatient and outpatient)  [phone] (Suboxone and Methadone) [address]           [phone]           [facility]    [facility] - Intensive Outpatient [address]     [address] [place], [place] [address]     [place], [place]  [phone]      [phone]  [facility] (Outpatient, Inpatient, Chemical  [facility] (Groups and Residental) (insurance only) [phone]    [place], [place]          [phone]       [facility]    [facility] (for caregivers and family) [address]     [address] [place], [place]     [place], [place] [phone]      [phone]  Residential Treatment Programs  [facility]  [facility](2 years) Residential: 90 days)  [facility] [address]      [address] [place], [place]     [place], [place] [phone]      [phone] or [phone]  [facility]    [facility] [address]      [address] [place], [place]     , [place] [phone]      [phone]  [facility]   [facility] ([facility]) [address]     [address] [place], [place] [address]     [place], [place] [phone]      [phone] Admissions: 8am-3pm M-F  [facility]: Residential Program (90 Days)              [facility]: [facility]  [place], [place]     [place],   [phone] or [phone]    (Walk in Hours over the weekend or by referral)   [facility]: [facility]:[phone] (for crisis  response 24 hours a day) 

## 2022-07-04 NOTE — H&P (Signed)
Arlyss Repress, MD [address]  [address]  [place], [place] [address]  Main: [phone]  Fax: [phone] Pager: [phone]  Primary Care Physician:  Dana Allan, MD Primary Gastroenterologist:  Dr. Arlyss Repress  Pre-Procedure History & Physical: HPI:  Levi Mejia is a 52 y.o. male is here for an endoscopy.   Past Medical History:  Diagnosis Date   Alcohol abuse    Attention deficit disorder without mention of hyperactivity    HTN (hypertension)    Hyperlipidemia 07/03/2022   ICH (intracerebral hemorrhage) (HCC)    Seizures (HCC)    Stroke [facility])     Past Surgical History:  Procedure Laterality Date   Hand tendon reattachment  02/28/1990   Right hand   toe reattachment  02/28/1994   Right 1st toe    Prior to Admission medications   Medication Sig Start Date End Date Taking? Authorizing Provider  aspirin EC 81 MG tablet Take 1 tablet (81 mg total) by mouth daily. 04/08/22   Sunnie Nielsen, DO  ibuprofen (ADVIL) 200 MG tablet Take 2 tablets (400 mg total) by mouth every 6 (six) hours as needed for fever, headache or mild pain. 04/08/22   Sunnie Nielsen, DO  losartan (COZAAR) 25 MG tablet Take 0.5 tablets (12.5 mg total) by mouth daily. 06/23/22   Dana Allan, MD  Multiple Vitamin (MULTIVITAMIN WITH MINERALS) TABS tablet Take 1 tablet by mouth daily. 04/09/22   Sunnie Nielsen, DO    Allergies as of 06/29/2022   (No Known Allergies)    Family History  Problem Relation Age of Onset   Heart attack Father 41   Hypertension Father    Coronary artery disease Mother 52       Stent   Cancer Mother        Bone   Hypertension Mother    Hypertension Brother    Diabetes Neg Hx    Prostate cancer Neg Hx    Colon cancer Neg Hx    Heart disease Neg Hx     Social History   Socioeconomic History   Marital status: Married    Spouse name: Not on file   Number of children: 1   Years of education: Not on file   Highest education level: Not  on file  Occupational History   Occupation: Curator    Comment: Engineering geologist  Tobacco Use   Smoking status: Never   Smokeless tobacco: Never  Vaping Use   Vaping Use: Former  Substance and Sexual Activity   Alcohol use: Yes    Comment: Regular   Drug use: Not Currently    Types: Marijuana   Sexual activity: Not Currently  Other Topics Concern   Not on file  Social History Narrative   Divorced   Common law marriage since 2006      1 daughter      Right handed   Social Determinants of Health   Financial Resource Strain: Not on file  Food Insecurity: No Food Insecurity (06/30/2022)   Hunger Vital Sign    Worried About Running Out of Food in the Last Year: Never true    Ran Out of Food in the Last Year: Never true  Transportation Needs: No Transportation Needs (06/30/2022)   PRAPARE - Administrator, Civil Service (Medical): No    Lack of Transportation (Non-Medical): No  Physical Activity: Not on file  Stress: Not on file  Social Connections: Not on file  Intimate Partner Violence:  Not At Risk (06/30/2022)   Humiliation, Afraid, Rape, and Kick questionnaire    Fear of Current or Ex-Partner: No    Emotionally Abused: No    Physically Abused: No    Sexually Abused: No    Review of Systems: See HPI, otherwise negative ROS  Physical Exam: BP 98/75 (BP Location: Right Arm)   Pulse 80   Temp 98.2 F (36.8 C)   Resp 16   Ht 5\' 11"  (1.803 m)   Wt 62.5 kg   SpO2 96%   BMI 19.22 kg/m  General:   Alert,  pleasant and cooperative in NAD Head:  Normocephalic and atraumatic. Neck:  Supple; no masses or thyromegaly. Lungs:  Clear throughout to auscultation.    Heart:  Regular rate and rhythm. Abdomen:  Soft, nontender and nondistended. Normal bowel sounds, without guarding, and without rebound.   Neurologic:  Alert and  oriented x4;  grossly normal neurologically.  Impression/Plan: Levi Mejia Levi Mejia is here for an endoscopy to be performed for  dysphagia  Risks, benefits, limitations, and alternatives regarding  endoscopy have been reviewed with the patient.  Questions have been answered.  All parties agreeable.   Lannette Donath, MD  07/04/2022, 10:59 AM

## 2022-07-04 NOTE — Progress Notes (Signed)
Loree Fee [facility] to be D/C'd Home per MD order.  Discussed prescriptions and follow up appointments with the patient. Prescriptions given to patient, medication list explained in detail. Pt verbalized understanding.  Allergies as of 07/04/2022       Reactions   Lactose Intolerance (gi)         Medication List     STOP taking these medications    ibuprofen 200 MG tablet Commonly known as: ADVIL   losartan 25 MG tablet Commonly known as: COZAAR       TAKE these medications    aspirin EC 81 MG tablet Take 1 tablet (81 mg total) by mouth daily.   folic acid 1 MG tablet Commonly known as: FOLVITE Take 1 tablet (1 mg total) by mouth daily. Start taking on: Jul 05, 2022   multivitamin with minerals Tabs tablet Take 1 tablet by mouth daily.   pantoprazole 40 MG tablet Commonly known as: PROTONIX Take 1 tablet (40 mg total) by mouth 2 (two) times daily before a meal.   thiamine 100 MG tablet Commonly known as: Vitamin B-1 Take 1 tablet (100 mg total) by mouth daily. Start taking on: Jul 05, 2022        Vitals:   07/04/22 1117 07/04/22 1152  BP:  115/83  Pulse:  82  Resp:  16  Temp: 98 F (36.7 C) 97.9 F (36.6 C)  SpO2:  100%    Skin clean, dry and intact without evidence of skin break down, no evidence of skin tears noted. IV catheter discontinued intact. Site without signs and symptoms of complications. Dressing and pressure applied. Pt denies pain at this time. No complaints noted.  An After Visit Summary was printed and given to the patient. Patient escorted via WC, and D/C home via private auto.  Lyann Hagstrom C. Jilda Roche

## 2022-07-04 NOTE — Progress Notes (Signed)
  Transition of Care [facility]) Screening Note   Patient Details  Name: Shrey Hohman [facility] Date of Birth: 1970/06/26   Transition of Care [facility]) CM/SW Contact:    Chapman Fitch, RN Phone Number: 07/04/2022, 1:46 PM    Transition of Care Department [facility]) has reviewed patient and no TOC needs have been identified at this time. We will continue to monitor patient advancement through interdisciplinary progression rounds. If new patient transition needs arise, please place a TOC consult.  Per MD no TOC needs Substance abuse resources added to AVS

## 2022-07-04 NOTE — Discharge Summary (Signed)
Physician Discharge Summary   Patient: Levi Mejia MRN: [id number]  DOB: [birth year]   Admit:     Date of Admission: 06/29/2022 Admitted from: home   Discharge: Date of discharge: 07/04/22 Disposition: Home Condition at discharge: good  CODE STATUS: FULL CODE     Discharge Physician: Sunnie Nielsen, DO [facility]     PCP: Dana Allan, MD  Recommendations for Outpatient Follow-up:  Follow up with PCP Dana Allan, MD in 2-4 weeks Please obtain labs/tests: CBC, CMP in 2-4 weeks Will need repeat EGD - follow w/ Dr Allegra Lai in 1 month - her office should call to arrange, pt is asked to call them to confirm by end of this week / beginning of next week  Please follow up on the following pending results: surgical pathology from EGD today  PCP AND OTHER OUTPATIENT PROVIDERS: SEE BELOW FOR SPECIFIC DISCHARGE INSTRUCTIONS PRINTED FOR PATIENT IN ADDITION TO GENERIC AVS PATIENT INFO     Discharge Instructions     Increase activity slowly   Complete by: As directed          Discharge Diagnoses: Principal Problem:   Alcoholic hepatitis Active Problems:   Alcohol dependence (HCC)   Syncope, vasovagal   AKI (acute kidney injury) (HCC)   Lactic acidosis   Dehydration   Alcohol withdrawal (HCC)   Protein-calorie malnutrition, severe   Nausea and vomiting   Esophageal dysphagia       Hospital Course: Levi Mejia is a 52 y.o. male with medical history significant of alcohol abuse, ADHD, HTN, presents to the ED with a chief complaint of nausea and vomiting x4d. Dysphagia, cannot tolerate solids, scheduled for esophageal dilation. (+)EtOH use w/ hx seizure in the past. Last EtOH consumption 04/30, typical intake half a bottle of hard liquor a day  05/01: to ED, CT abdomen pelvis no acute findings but marked hepatic steatosis, elevated AST/ALT to 282/122 and Tbili 2.0 question alcoholic hepatitis. Admitted for AKI, CIWA protocol, IV  fluids 05/02: Cr normalized, AST/ALT improved, lactate WNL. Pt is motivated to stop drinking, will keep inpatient for withdrawal monitoring. D/w GI, holding off on consult for now unless he is not able to tolerate full liquid diet. Valium 5 mg po q6h + prn breakthrough based on CIWA 05/03: AST/ALT increased. CIWA stable around 5 compared to 10 on admission. Valium 5 mg po q8h + prn breakthrough based on CIWA 05/04: low CIWA. Valium to q12h later today. If stable tomorrow, will be outside window for DT and can potentially discharge home unless GI planning on procedure. Per Dr Servando Snare (GI) "Monday afternoon then there is opening in my schedule for Monday afternoon to do a procedure on him as an inpatient if he is still here for other reasons otherwise he should follow-up with Dr. Allegra Lai on Tuesday." 05/05: stable, no tremor or anxiety, CIWA 0, await EGD tomorrow.  05/06: EGD today w/ dilation (+)benign appearing esophageal stenosis. Follow in 1 month   Consultants:  none  Procedures: none      ASSESSMENT & PLAN:   Alcohol dependence (HCC) Had alcoholic seizure last year when trying to quit Scheduled benzodiazepine taper --> off benzos by time of d/c home Encouraged abstinence   AKI (acute kidney injury) (HCC) - resolved  Dehydration - improved  Monitor BMP   Syncope, vasovagal Patient had a syncopal episode after nausea and vomiting With a history of alcohol dependence and recent abstinence from alcohol continue seizure precautions echo for completeness -->  no concerns   Dysphagia to solids Esophageal stricture s/p dilation  Follow in 1 month w/ Dr Allegra Lai   Alcoholic hepatitis Transaminitis  Hepatic steatosis without cirrhosis Acute hepatitis panel for completeness --> NR Hold hepatotoxic agents when possible Will need GI follow-up 1 month   Lactic acidosis - resolved Likely d/t dehydration Check as needed             Discharge Instructions  Allergies as of  07/04/2022       Reactions   Lactose Intolerance (gi)         Medication List     STOP taking these medications    ibuprofen 200 MG tablet Commonly known as: ADVIL   losartan 25 MG tablet Commonly known as: COZAAR       TAKE these medications    aspirin EC 81 MG tablet Take 1 tablet (81 mg total) by mouth daily.   folic acid 1 MG tablet Commonly known as: FOLVITE Take 1 tablet (1 mg total) by mouth daily. Start taking on: Jul 05, 2022   multivitamin with minerals Tabs tablet Take 1 tablet by mouth daily.   pantoprazole 40 MG tablet Commonly known as: PROTONIX Take 1 tablet (40 mg total) by mouth 2 (two) times daily before a meal.   thiamine 100 MG tablet Commonly known as: Vitamin B-1 Take 1 tablet (100 mg total) by mouth daily. Start taking on: Jul 05, 2022         Follow-up Information     Toney Reil, MD. Go on 07/06/2022.   Specialty: Gastroenterology Why: follow up as directed. You have a appointment already 07/06/22 at 3:15pm. Contact information: [address] [place] [address] [phone]                 Allergies  Allergen Reactions   Lactose Intolerance (Gi)      Subjective: pt feeling well following procedure, no pain, eager for discharge home, confirms no more alcohol use!    Discharge Exam: BP 115/83   Pulse 82   Temp 97.9 F (36.6 C)   Resp 16   Ht 5\' 11"  (1.803 m)   Wt 62.5 kg   SpO2 100%   BMI 19.22 kg/m  General: Pt is alert, awake, not in acute distress Cardiovascular: RRR, S1/S2 +, no rubs, no gallops Respiratory: CTA bilaterally, no wheezing, no rhonchi Abdominal: Soft, NT, ND, bowel sounds + Extremities: no edema, no cyanosis     The results of significant diagnostics from this hospitalization (including imaging, microbiology, ancillary and laboratory) are listed below for reference.     Microbiology: No results found for this or any previous visit (from the past 240 hour(s)).    Labs: BNP (last 3 results) No results for input(s): "BNP" in the last 8760 hours. Basic Metabolic Panel: Recent Labs  Lab 06/30/22 0309 07/01/22 0543 07/02/22 0402 07/03/22 0438 07/04/22 0534  NA 132* 135 134* 134* 135  K 3.7 3.4* 3.2* 3.3* 3.9  CL 93* 98 97* 100 98  CO2 27 30 28 28 25   GLUCOSE 82 101* 108* 114* 107*  BUN 10 8 5* 5* 9  CREATININE 0.88 0.76 0.65 0.68 0.85  CALCIUM 8.0* 8.0* 8.6* 8.9 9.4  MG 1.2* 2.1 1.7 1.5* 1.5*  PHOS  --  2.5 2.4* 2.4* 1.9*   Liver Function Tests: Recent Labs  Lab 06/30/22 0309 07/01/22 0543 07/02/22 0402 07/03/22 0438 07/04/22 0534  AST 196* 323* 315* 169* 119*  ALT 93* 111*  138* 114* 95*  ALKPHOS 54 52 69 70 63  BILITOT 2.2* 2.6* 2.6* 1.8* 1.4*  PROT 6.4* 6.2* 6.5 6.6 6.9  ALBUMIN 3.6 3.2* 3.4* 3.5 3.5   Recent Labs  Lab 06/29/22 1847  LIPASE 40   No results for input(s): "AMMONIA" in the last 168 hours. CBC: Recent Labs  Lab 06/29/22 1847 06/30/22 0309 07/02/22 0402  WBC 9.9 6.1 4.6  NEUTROABS  --  3.5  --   HGB 15.2 12.2* 12.4*  HCT 40.8 34.0* 35.3*  MCV 99.3 101.8* 102.3*  PLT 228 126* 138*   Cardiac Enzymes: No results for input(s): "CKTOTAL", "CKMB", "CKMBINDEX", "TROPONINI" in the last 168 hours. BNP: Invalid input(s): "POCBNP" CBG: No results for input(s): "GLUCAP" in the last 168 hours. D-Dimer No results for input(s): "DDIMER" in the last 72 hours. Hgb A1c No results for input(s): "HGBA1C" in the last 72 hours. Lipid Profile No results for input(s): "CHOL", "HDL", "LDLCALC", "TRIG", "CHOLHDL", "LDLDIRECT" in the last 72 hours. Thyroid function studies No results for input(s): "TSH", "T4TOTAL", "T3FREE", "THYROIDAB" in the last 72 hours.  Invalid input(s): "FREET3" Anemia work up No results for input(s): "VITAMINB12", "FOLATE", "FERRITIN", "TIBC", "IRON", "RETICCTPCT" in the last 72 hours. Urinalysis    Component Value Date/Time   COLORURINE STRAW (A) 02/23/2018 0135   APPEARANCEUR CLEAR (A)  02/23/2018 0135   APPEARANCEUR Clear 03/25/2014 1100   LABSPEC 1.005 02/23/2018 0135   LABSPEC >1.060 03/25/2014 1100   PHURINE 6.0 02/23/2018 0135   GLUCOSEU NEGATIVE 02/23/2018 0135   GLUCOSEU Negative 03/25/2014 1100   HGBUR NEGATIVE 02/23/2018 0135   HGBUR negative 07/17/2008 0956   BILIRUBINUR NEGATIVE 02/23/2018 0135   BILIRUBINUR Negative 03/25/2014 1100   KETONESUR 5 (A) 02/23/2018 0135   PROTEINUR NEGATIVE 02/23/2018 0135   UROBILINOGEN 0.2 07/17/2008 0956   NITRITE NEGATIVE 02/23/2018 0135   LEUKOCYTESUR NEGATIVE 02/23/2018 0135   LEUKOCYTESUR Negative 03/25/2014 1100   Sepsis Labs Recent Labs  Lab 06/29/22 1847 06/30/22 0309 07/02/22 0402  WBC 9.9 6.1 4.6   Microbiology No results found for this or any previous visit (from the past 240 hour(s)). Imaging ECHOCARDIOGRAM COMPLETE  Result Date: 06/30/2022    ECHOCARDIOGRAM REPORT   Patient Name:   DRAXTON PARROW [facility] Date of Exam: 06/30/2022 Medical Rec #:  [id number]                Height:       71.0 in Accession #:    [id number]               Weight:       155.0 lb Date of Birth:  07-16-1970                 BSA:          1.892 m Patient Age:    51 years                 BP:           132/84 mmHg Patient Gender: M                        HR:           75 bpm. Exam Location:  [facility] Procedure: 2D Echo, Cardiac Doppler and Color Doppler Indications:     Syncope R55  History:         Patient has prior history of Echocardiogram examinations, most  recent 04/07/2022. Stroke; Risk Factors:Hypertension. Alcohol                  abuse.  Sonographer:     Cristela Blue Referring Phys:  [id number] ASIA B ZIERLE-GHOSH Diagnosing Phys: Julien Nordmann MD IMPRESSIONS  1. Left ventricular ejection fraction, by estimation, is 60 to 65%. The left ventricle has normal function. The left ventricle has no regional wall motion abnormalities. Left ventricular diastolic parameters were normal.  2. Right ventricular systolic function is normal. The  right ventricular size is normal. There is normal pulmonary artery systolic pressure. The estimated right ventricular systolic pressure is 20.5 mmHg.  3. The mitral valve is normal in structure. No evidence of mitral valve regurgitation. No evidence of mitral stenosis.  4. The aortic valve is normal in structure. Aortic valve regurgitation is not visualized. No aortic stenosis is present.  5. The inferior vena cava is normal in size with greater than 50% respiratory variability, suggesting right atrial pressure of 3 mmHg. FINDINGS  Left Ventricle: Left ventricular ejection fraction, by estimation, is 60 to 65%. The left ventricle has normal function. The left ventricle has no regional wall motion abnormalities. The left ventricular internal cavity size was normal in size. There is  no left ventricular hypertrophy. Left ventricular diastolic parameters were normal. Right Ventricle: The right ventricular size is normal. No increase in right ventricular wall thickness. Right ventricular systolic function is normal. There is normal pulmonary artery systolic pressure. The tricuspid regurgitant velocity is 2.09 m/s, and  with an assumed right atrial pressure of 3 mmHg, the estimated right ventricular systolic pressure is 20.5 mmHg. Left Atrium: Left atrial size was normal in size. Right Atrium: Right atrial size was normal in size. Pericardium: There is no evidence of pericardial effusion. Mitral Valve: The mitral valve is normal in structure. No evidence of mitral valve regurgitation. No evidence of mitral valve stenosis. Tricuspid Valve: The tricuspid valve is normal in structure. Tricuspid valve regurgitation is mild . No evidence of tricuspid stenosis. Aortic Valve: The aortic valve is normal in structure. Aortic valve regurgitation is not visualized. No aortic stenosis is present. Aortic valve mean gradient measures 3.0 mmHg. Aortic valve peak gradient measures 4.8 mmHg. Aortic valve area, by VTI measures 2.95 cm.  Pulmonic Valve: The pulmonic valve was normal in structure. Pulmonic valve regurgitation is not visualized. No evidence of pulmonic stenosis. Aorta: The aortic root is normal in size and structure. Venous: The inferior vena cava is normal in size with greater than 50% respiratory variability, suggesting right atrial pressure of 3 mmHg. IAS/Shunts: No atrial level shunt detected by color flow Doppler.  LEFT VENTRICLE PLAX 2D LVIDd:         4.50 cm   Diastology LVIDs:         2.70 cm   LV e' medial:    11.20 cm/s LV PW:         0.90 cm   LV E/e' medial:  7.9 LV IVS:        0.80 cm   LV e' lateral:   6.31 cm/s LVOT diam:     2.00 cm   LV E/e' lateral: 13.9 LV SV:         69 LV SV Index:   37 LVOT Area:     3.14 cm  RIGHT VENTRICLE RV Basal diam:  4.60 cm RV Mid diam:    3.70 cm RV S prime:     12.20 cm/s TAPSE (M-mode): 2.0 cm LEFT  ATRIUM             Index        RIGHT ATRIUM           Index LA diam:        2.90 cm 1.53 cm/m   RA Area:     12.80 cm LA Vol (A2C):   44.5 ml 23.52 ml/m  RA Volume:   28.90 ml  15.27 ml/m LA Vol (A4C):   22.0 ml 11.63 ml/m LA Biplane Vol: 31.3 ml 16.54 ml/m  AORTIC VALVE AV Area (Vmax):    2.62 cm AV Area (Vmean):   2.30 cm AV Area (VTI):     2.95 cm AV Vmax:           110.00 cm/s AV Vmean:          82.100 cm/s AV VTI:            0.234 m AV Peak Grad:      4.8 mmHg AV Mean Grad:      3.0 mmHg LVOT Vmax:         91.80 cm/s LVOT Vmean:        60.000 cm/s LVOT VTI:          0.220 m LVOT/AV VTI ratio: 0.94  AORTA Ao Root diam: 3.10 cm MITRAL VALVE               TRICUSPID VALVE MV Area (PHT): 4.49 cm    TR Peak grad:   17.5 mmHg MV Decel Time: 169 msec    TR Vmax:        209.00 cm/s MV E velocity: 88.00 cm/s MV A velocity: 60.80 cm/s  SHUNTS MV E/A ratio:  1.45        Systemic VTI:  0.22 m                            Systemic Diam: 2.00 cm Julien Nordmann MD Electronically signed by Julien Nordmann MD Signature Date/Time: 06/30/2022/12:29:25 PM    Final       Time coordinating  discharge: over 30 minutes  SIGNED:  Sunnie Nielsen DO [facility]

## 2022-07-04 NOTE — Transfer of Care (Signed)
Immediate Anesthesia Transfer of Care Note  Patient: Levi Mejia [facility]  Procedure(s) Performed: ESOPHAGOGASTRODUODENOSCOPY (EGD) WITH PROPOFOL  Patient Location: PACU  Anesthesia Type:General  Level of Consciousness: drowsy  Airway & Oxygen Therapy: Patient Spontanous Breathing  Post-op Assessment: Report given to RN and Post -op Vital signs reviewed and stable  Post vital signs: Reviewed and stable  Last Vitals:  Vitals Value Taken Time  BP 99/84 07/04/22 1117  Temp 35.9 1117  Pulse 116 07/04/22 1117  Resp 18 07/04/22 1117  SpO2 100 % 07/04/22 1117  Vitals shown include unvalidated device data.  Last Pain:  Vitals:   07/04/22 1117  TempSrc:   PainSc: Asleep         Complications: No notable events documented.

## 2022-07-04 NOTE — Anesthesia Preprocedure Evaluation (Signed)
Anesthesia Evaluation  Patient identified by MRN, date of birth, ID band Patient awake    Reviewed: Allergy & Precautions, NPO status , Patient's Chart, lab work & pertinent test results  History of Anesthesia Complications Negative for: history of anesthetic complications  Airway Mallampati: III  TM Distance: >3 FB Neck ROM: full    Dental  (+) Chipped, Poor Dentition, Missing   Pulmonary neg pulmonary ROS, neg shortness of breath   Pulmonary exam normal        Cardiovascular Exercise Tolerance: Good hypertension, Normal cardiovascular exam     Neuro/Psych Seizures -,  PSYCHIATRIC DISORDERS      CVA    GI/Hepatic negative GI ROS,,,(+)     substance abuse  , Hepatitis -  Endo/Other  negative endocrine ROS    Renal/GU Renal disease  negative genitourinary   Musculoskeletal   Abdominal   Peds  Hematology negative hematology ROS (+)   Anesthesia Other Findings Patient reports that they do not think that any food or pills are stuck in their throat at this time.  Past Medical History: No date: Alcohol abuse No date: Attention deficit disorder without mention of hyperactivity No date: HTN (hypertension) 07/03/2022: Hyperlipidemia No date: ICH (intracerebral hemorrhage) (HCC) No date: Seizures (HCC) No date: Stroke [facility])  Past Surgical History: 02/28/1990: Hand tendon reattachment     Comment:  Right hand 02/28/1994: toe reattachment     Comment:  Right 1st toe  BMI    Body Mass Index: 19.22 kg/m      Reproductive/Obstetrics negative OB ROS                             Anesthesia Physical Anesthesia Plan  ASA: 3  Anesthesia Plan: General   Post-op Pain Management:    Induction: Intravenous  PONV Risk Score and Plan: Propofol infusion and TIVA  Airway Management Planned: Natural Airway and Nasal Cannula  Additional Equipment:   Intra-op Plan:   Post-operative Plan:    Informed Consent: I have reviewed the patients History and Physical, chart, labs and discussed the procedure including the risks, benefits and alternatives for the proposed anesthesia with the patient or authorized representative who has indicated his/her understanding and acceptance.     Dental Advisory Given  Plan Discussed with: Anesthesiologist, CRNA and Surgeon  Anesthesia Plan Comments: (Patient consented for risks of anesthesia including but not limited to:  - adverse reactions to medications - risk of airway placement if required - damage to eyes, teeth, lips or other oral mucosa - nerve damage due to positioning  - sore throat or hoarseness - Damage to heart, brain, nerves, lungs, other parts of body or loss of life  Patient voiced understanding.)       Anesthesia Quick Evaluation

## 2022-07-05 ENCOUNTER — Telehealth: Payer: Self-pay

## 2022-07-05 ENCOUNTER — Encounter: Payer: Self-pay | Admitting: Gastroenterology

## 2022-07-05 ENCOUNTER — Encounter: Payer: Self-pay | Admitting: Neurology

## 2022-07-05 LAB — SURGICAL PATHOLOGY

## 2022-07-05 NOTE — Transitions of Care (Post Inpatient/ED Visit) (Unsigned)
   07/05/2022  Name: Levi Mejia MRN: [id number] DOB: 09-24-1970  Today's TOC FU Call Status: Today's TOC FU Call Status:: Unsuccessul Call (1st Attempt) Unsuccessful Call (1st Attempt) Date: 07/05/22  Attempted to reach the patient regarding the most recent Inpatient/ED visit.  Follow Up Plan: Additional outreach attempts will be made to reach the patient to complete the Transitions of Care (Post Inpatient/ED visit) call.   Signature Karena Addison, LPN [facility] Nurse Health Advisor Direct Dial [phone]

## 2022-07-06 ENCOUNTER — Encounter: Payer: Self-pay | Admitting: Gastroenterology

## 2022-07-06 ENCOUNTER — Ambulatory Visit: Payer: PRIVATE HEALTH INSURANCE | Admitting: Gastroenterology

## 2022-07-06 NOTE — Transitions of Care (Post Inpatient/ED Visit) (Signed)
   07/06/2022  Name: Levi Mejia MRN: [id number] DOB: 1970/12/18  Today's TOC FU Call Status: Today's TOC FU Call Status:: Successful TOC FU Call Competed Unsuccessful Call (1st Attempt) Date: 07/05/22 [facility] FU Call Complete Date: 07/06/22  Transition Care Management Follow-up Telephone Call Date of Discharge: 07/04/22 Discharge Facility: [facility]) Type of Discharge: Inpatient Admission Primary Inpatient Discharge Diagnosis:: alcoholic hepatitis How have you been since you were released from the hospital?: Better Any questions or concerns?: No  Items Reviewed: Did you receive and understand the discharge instructions provided?: Yes Medications obtained,verified, and reconciled?: Yes (Medications Reviewed) Any new allergies since your discharge?: No Dietary orders reviewed?: Yes Do you have support at home?: Yes People in Home: spouse  Medications Reviewed Today: Medications Reviewed Today     Reviewed by Karena Addison, LPN (Licensed Practical Nurse) on 07/06/22 at 1536  Med List Status: <None>   Medication Order Taking? Sig Documenting Provider Last Dose Status Informant  aspirin EC 81 MG tablet [id number] Yes Take 1 tablet (81 mg total) by mouth daily. Sunnie Nielsen, DO Taking Active   folic acid (FOLVITE) 1 MG tablet [id number] Yes Take 1 tablet (1 mg total) by mouth daily. Sunnie Nielsen, DO Taking Active   Multiple Vitamin (MULTIVITAMIN WITH MINERALS) TABS tablet [id number] Yes Take 1 tablet by mouth daily. Sunnie Nielsen, DO Taking Active   pantoprazole (PROTONIX) 40 MG tablet [id number] Yes Take 1 tablet (40 mg total) by mouth 2 (two) times daily before a meal. Sunnie Nielsen, DO Taking Active   thiamine (VITAMIN B-1) 100 MG tablet [id number] Yes Take 1 tablet (100 mg total) by mouth daily. Sunnie Nielsen, DO Taking Active             Home Care and Equipment/Supplies: Were Home Health Services Ordered?: NA Any  new equipment or medical supplies ordered?: NA  Functional Questionnaire: Do you need assistance with bathing/showering or dressing?: No Do you need assistance with meal preparation?: No Do you need assistance with eating?: No Do you have difficulty maintaining continence: No Do you need assistance with getting out of bed/getting out of a chair/moving?: No Do you have difficulty managing or taking your medications?: No  Follow up appointments reviewed: PCP Follow-up appointment confirmed?: Yes Date of PCP follow-up appointment?: 07/04/22 Follow-up Provider: Dr Clent Ridges [facility] Follow-up appointment confirmed?: Yes Date of Specialist follow-up appointment?: 07/06/22 Follow-Up Specialty Provider:: Dr Allegra Lai GI Do you need transportation to your follow-up appointment?: No Do you understand care options if your condition(s) worsen?: Yes-patient verbalized understanding    SIGNATURE Karena Addison, LPN [facility] Nurse Health Advisor Direct Dial [phone]

## 2022-07-14 ENCOUNTER — Encounter: Payer: Self-pay | Admitting: Family Medicine

## 2022-07-14 ENCOUNTER — Ambulatory Visit: Payer: PRIVATE HEALTH INSURANCE | Admitting: Family Medicine

## 2022-07-14 VITALS — BP 132/88 | HR 83 | Ht 71.0 in | Wt 162.0 lb

## 2022-07-14 DIAGNOSIS — F1029 Alcohol dependence with unspecified alcohol-induced disorder: Secondary | ICD-10-CM | POA: Diagnosis not present

## 2022-07-14 DIAGNOSIS — N179 Acute kidney failure, unspecified: Secondary | ICD-10-CM

## 2022-07-14 DIAGNOSIS — R1319 Other dysphagia: Secondary | ICD-10-CM

## 2022-07-14 DIAGNOSIS — R7989 Other specified abnormal findings of blood chemistry: Secondary | ICD-10-CM

## 2022-07-14 DIAGNOSIS — I10 Essential (primary) hypertension: Secondary | ICD-10-CM | POA: Diagnosis not present

## 2022-07-14 DIAGNOSIS — K7010 Alcoholic hepatitis without ascites: Secondary | ICD-10-CM

## 2022-07-14 DIAGNOSIS — E876 Hypokalemia: Secondary | ICD-10-CM

## 2022-07-14 LAB — COMPREHENSIVE METABOLIC PANEL
ALT: 63 U/L — ABNORMAL HIGH (ref 0–53)
AST: 73 U/L — ABNORMAL HIGH (ref 0–37)
Albumin: 3.9 g/dL (ref 3.5–5.2)
Alkaline Phosphatase: 75 U/L (ref 39–117)
BUN: 9 mg/dL (ref 6–23)
CO2: 30 mEq/L (ref 19–32)
Calcium: 9 mg/dL (ref 8.4–10.5)
Chloride: 100 mEq/L (ref 96–112)
Creatinine, Ser: 0.88 mg/dL (ref 0.40–1.50)
GFR: 99.32 mL/min (ref >60.00)
Glucose, Bld: 87 mg/dL (ref 70–99)
Potassium: 4 mEq/L (ref 3.5–5.1)
Sodium: 138 mEq/L (ref 135–145)
Total Bilirubin: 0.7 mg/dL (ref 0.2–1.2)
Total Protein: 6.9 g/dL (ref 6.0–8.3)

## 2022-07-14 LAB — CBC WITH DIFFERENTIAL/PLATELET
Basophils Absolute: 0.1 10*3/uL (ref 0.0–0.1)
Basophils Relative: 1.4 % (ref 0.0–3.0)
Eosinophils Absolute: 0.2 10*3/uL (ref 0.0–0.7)
Eosinophils Relative: 1.8 % (ref 0.0–5.0)
HCT: 40.7 % (ref 39.0–52.0)
Hemoglobin: 14.1 g/dL (ref 13.0–17.0)
Lymphocytes Relative: 21 % (ref 12.0–46.0)
Lymphs Abs: 1.9 10*3/uL (ref 0.7–4.0)
MCHC: 34.7 g/dL (ref 30.0–36.0)
MCV: 107.5 fl — ABNORMAL HIGH (ref 78.0–100.0)
Monocytes Absolute: 0.6 10*3/uL (ref 0.1–1.0)
Monocytes Relative: 7.1 % (ref 3.0–12.0)
Neutro Abs: 6 10*3/uL (ref 1.4–7.7)
Neutrophils Relative %: 68.7 % (ref 43.0–77.0)
Platelets: 319 10*3/uL (ref 150.0–400.0)
RBC: 3.79 Mil/uL — ABNORMAL LOW (ref 4.22–5.81)
RDW: 13.6 % (ref 11.5–15.5)
WBC: 8.8 10*3/uL (ref 4.0–10.5)

## 2022-07-14 LAB — MAGNESIUM: Magnesium: 1.6 mg/dL (ref 1.5–2.5)

## 2022-07-14 LAB — PHOSPHORUS: Phosphorus: 3.5 mg/dL (ref 2.3–4.6)

## 2022-07-14 NOTE — Patient Instructions (Signed)
It was a pleasure meeting you today. Thank you for allowing me to take part in your health care.  Our goals for today as we discussed include:  We will get some labs today.  If they are abnormal or we need to do something about them, I will call you.  If they are normal, I will send you a message on MyChart (if it is active) or a letter in the mail.  If you don't hear from [place] in 2 weeks, please call the office at the number below.   Follow-up with Dr. Allegra Lai as scheduled.  Recommend Tetanus Vaccination.  This is given every 10 years.   Recommend Shingles vaccine.  This is a 2 dose series and can be given at your local pharmacy.  Please talk to your pharmacist about this.   Recommend Hepatitis B vaccines.   If you have any questions or concerns, please do not hesitate to call the office at [phone].  I look forward to our next visit and until then take care and stay safe.  Regards,   Dana Allan, MD   [facility]

## 2022-07-14 NOTE — Progress Notes (Signed)
SUBJECTIVE:   Chief Complaint  Patient presents with   Hospitalization Follow-up    [facility] 06/29/22-07/04/22   HPI Patient presents to clinic for follow-up recent ED hospitalization.  Patient recently admitted on 06/29/2022 to [facility] for alcoholic hepatitis.  Patient course included CT abdomen pelvis with no acute findings but marked hepatic steatosis.  LFTs and T. bili elevated concerning for alcoholic hepatitis.  He was admitted for AKI, CIWA protocol and IV fluid therapy.  During his stay he had Zurn for dysphagia.  On 05/06 underwent EGD with dilation (+) benign appearing esophageal stenosis.  He was discharged on 05/06 and advised to follow-up with Dr. Allegra Lai, GI in 1 month for repeat EGD.  He presents today and has no acute concerns.  Denies any EtOH intake since discharge from hospital 05/06.  Denies any fevers, abdominal pain, nausea/vomiting, difficulty swallowing.  Reports weight increased by 20 pounds since discharge.  Denies any seizure activity or tremors.  PERTINENT PMH / PSH: Hypertension EtOH dependence ADHD ICH History of seizures   OBJECTIVE:  BP 132/88   Pulse 83   Ht 5\' 11"  (1.803 m)   Wt 162 lb (73.5 kg)   SpO2 97%   BMI 22.59 kg/m    Physical Exam Vitals reviewed.  Constitutional:      General: He is not in acute distress.    Appearance: Normal appearance. He is normal weight. He is not ill-appearing, toxic-appearing or diaphoretic.  Eyes:     General:        Right eye: No discharge.        Left eye: No discharge.  Cardiovascular:     Rate and Rhythm: Normal rate and regular rhythm.     Heart sounds: Normal heart sounds.  Pulmonary:     Effort: Pulmonary effort is normal.     Breath sounds: Normal breath sounds.  Abdominal:     General: Bowel sounds are normal.  Musculoskeletal:        General: Normal range of motion.     Cervical back: Normal range of motion.  Skin:    General: Skin is warm and dry.  Neurological:     Mental Status: He is alert  and oriented to person, place, and time. Mental status is at baseline.  Psychiatric:        Mood and Affect: Mood normal.        Behavior: Behavior normal.        Thought Content: Thought content normal.        Judgment: Judgment normal.     ASSESSMENT/PLAN:  Alcohol dependence with unspecified alcohol-induced disorder (HCC) Assessment & Plan: Chronic use of alcohol since age 36.  Drinks 1/5 of alcohol daily.  Has not had EtOH since admission to [facility] 05/01 Continue to encourage EtOH cessation   Hypomagnesemia -     Comprehensive metabolic panel -     Magnesium  Hypokalemia -     Comprehensive metabolic panel  Abnormal LFTs -     Comprehensive metabolic panel -     CBC with Differential/Platelet  AKI (acute kidney injury) [facility]) Assessment & Plan: AKI during hospitalization Will check c-Met prior to reinitiation of ARB Continue to hold NSAIDs or nephrotoxic agents Continue to hydrate Continue EtOH cessation   Hypophosphatemia -     Phosphorus  Primary hypertension Assessment & Plan: Chronic.  Asymptomatic and well-controlled today.  Losartan discontinued during hospitalization secondary to AKI. Check c-Met today Monitor blood pressure at home, goal less than 140/90  Follow-up in 1 week, likely will need to restart antihypertensive Strict return precautions provided    Esophageal dysphagia Assessment & Plan: Improved Status post esophageal dilation 05/06 Follow-up with Dr. Allegra Lai in 1 month.   Alcoholic hepatitis without ascites Assessment & Plan: Asymptomatic. Check LFTs today Encourage EtOH cessation Follow-up with Dr. Allegra Lai as scheduled    PDMP reviewed  Return in about 1 week (around 07/21/2022).  Dana Allan, MD

## 2022-07-19 ENCOUNTER — Ambulatory Visit: Payer: PRIVATE HEALTH INSURANCE | Admitting: Nurse Practitioner

## 2022-07-20 ENCOUNTER — Encounter: Payer: Self-pay | Admitting: Family Medicine

## 2022-07-21 ENCOUNTER — Ambulatory Visit: Payer: PRIVATE HEALTH INSURANCE | Admitting: Family Medicine

## 2022-07-30 NOTE — Assessment & Plan Note (Addendum)
Asymptomatic. Check LFTs today Encourage EtOH cessation Follow-up with Dr. Allegra Lai as scheduled

## 2022-07-30 NOTE — Assessment & Plan Note (Signed)
Chronic.  Asymptomatic and well-controlled today.  Losartan discontinued during hospitalization secondary to AKI. Check c-Met today Monitor blood pressure at home, goal less than 140/90 Follow-up in 1 week, likely will need to restart antihypertensive Strict return precautions provided

## 2022-07-30 NOTE — Assessment & Plan Note (Signed)
Chronic use of alcohol since age 52.  Drinks 1/5 of alcohol daily.  Has not had EtOH since admission to [facility] 05/01 Continue to encourage EtOH cessation

## 2022-07-30 NOTE — Assessment & Plan Note (Signed)
Improved Status post esophageal dilation 05/06 Follow-up with Dr. Allegra Lai in 1 month.

## 2022-07-30 NOTE — Assessment & Plan Note (Signed)
AKI during hospitalization Will check c-Met prior to reinitiation of ARB Continue to hold NSAIDs or nephrotoxic agents Continue to hydrate Continue EtOH cessation

## 2022-08-01 ENCOUNTER — Encounter: Payer: Self-pay | Admitting: Family

## 2022-08-01 ENCOUNTER — Ambulatory Visit: Payer: PRIVATE HEALTH INSURANCE | Admitting: Family

## 2022-08-01 VITALS — BP 138/88 | HR 92 | Temp 97.9°F | Ht 70.0 in | Wt 169.6 lb

## 2022-08-01 DIAGNOSIS — M79642 Pain in left hand: Secondary | ICD-10-CM | POA: Diagnosis not present

## 2022-08-01 DIAGNOSIS — I10 Essential (primary) hypertension: Secondary | ICD-10-CM | POA: Diagnosis not present

## 2022-08-01 MED ORDER — DICLOFENAC SODIUM 1 % EX GEL
4.0000 g | Freq: Three times a day (TID) | CUTANEOUS | 0 refills | Status: DC | PRN
Start: 2022-08-01 — End: 2023-09-18

## 2022-08-01 MED ORDER — LOSARTAN POTASSIUM 25 MG PO TABS
12.5000 mg | ORAL_TABLET | Freq: Every day | ORAL | 1 refills | Status: DC
Start: 2022-08-01 — End: 2023-09-18

## 2022-08-01 NOTE — Progress Notes (Signed)
Assessment & Plan:  Left hand pain Assessment & Plan: Question if arthritic versus injury from work.  Pending x-ray.  Trial ice, Voltaren gel.  Advised against acetaminophen at this time due to elevation in LFTs  Orders: -     Diclofenac Sodium; Apply 4 g topically 3 (three) times daily as needed.  Dispense: 50 g; Refill: 0 -     DG Hand Complete Left; Future  Primary hypertension Assessment & Plan: Blood pressure slightly elevated today.  Previously on losartan; suspended due to AKI, since resolved.  Secure chat with PCP, Dr. Clent Ridges whom agreed with resuming losartan 12.5 mg.  Patient will keep upcoming follow-up appointment with her 08/08/22. BMP in one week.   Orders: -     Basic metabolic panel -     Losartan Potassium; Take 0.5 tablets (12.5 mg total) by mouth daily.  Dispense: 90 tablet; Refill: 1     Return precautions given.   Risks, benefits, and alternatives of the medications and treatment plan prescribed today were discussed, and patient expressed understanding.   Education regarding symptom management and diagnosis given to patient on AVS either electronically or printed.  No follow-ups on file.  Rennie Plowman, FNP  Subjective:    Patient ID: Levi Mejia, male    DOB: [birth year], 52 y.o.   MRN: [id number]  CC: Levi Mejia is a 52 y.o. male who presents today for an acute visit.    HPI: Left hand swollen and pain x 3 weeks  Left lateral side of hand is swollen.     Pain with straightening and bending left 5th finger. Some numbness in left knuckle but no numbness in neck, upper arm.   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  He uses lots of tools and suspects he suffered an injury.  Previously had been on losartan 12.5 mg daily.  This was discontinued due to recent AKI, hospitalization 06/29/22  h/o HTN, stroke  Allergies: Lactose intolerance (gi) Current Outpatient Medications on File  Prior to Visit  Medication Sig Dispense Refill   aspirin EC 81 MG tablet Take 1 tablet (81 mg total) by mouth daily. 30 tablet 0   folic acid (FOLVITE) 1 MG tablet Take 1 tablet (1 mg total) by mouth daily. 30 tablet 0   Multiple Vitamin (MULTIVITAMIN WITH MINERALS) TABS tablet Take 1 tablet by mouth daily.     pantoprazole (PROTONIX) 40 MG tablet Take 1 tablet (40 mg total) by mouth 2 (two) times daily before a meal. 30 tablet 0   thiamine (VITAMIN B1) 100 MG tablet Take 100 mg by mouth daily.     No current facility-administered medications on file prior to visit.    Review of Systems  Constitutional:  Negative for chills and fever.  Respiratory:  Negative for cough.   Cardiovascular:  Negative for chest pain and palpitations.  Gastrointestinal:  Negative for nausea and vomiting.  Musculoskeletal:  Positive for arthralgias and joint swelling.  Skin:  Negative for rash and wound.      Objective:    BP 138/88   Pulse 92   Temp 97.9 F (36.6 C) (Oral)   Ht 5\' 10"  (1.778 m)   Wt 169 lb 9.6 oz (76.9 kg)   SpO2 99%   BMI 24.34 kg/m   BP Readings from Last 3 Encounters:  08/01/22 138/88  07/14/22 132/88  07/04/22 115/83   Wt Readings from Last 3 Encounters:  08/01/22 169 lb 9.6 oz (76.9 kg)  07/14/22 162 lb (73.5 kg)  07/04/22 137 lb 12.6 oz (62.5 kg)    Physical Exam Vitals reviewed.  Constitutional:      Appearance: He is well-developed.  Cardiovascular:     Rate and Rhythm: Regular rhythm.     Heart sounds: Normal heart sounds.  Pulmonary:     Effort: Pulmonary effort is normal. No respiratory distress.     Breath sounds: Normal breath sounds. No wheezing, rhonchi or rales.  Musculoskeletal:     Left forearm: No swelling, tenderness or bony tenderness.     Right wrist: No swelling, effusion or bony tenderness. Normal pulse.     Left wrist: No swelling, effusion or bony tenderness. Normal pulse.     Right hand: Normal pulse.     Left hand: Swelling and  tenderness present. Normal sensation. There is no disruption of two-point discrimination. Normal pulse.     Comments: Tenderness swelling left fifth MCP.  No erythema, increased heat.  Skin:    General: Skin is warm and dry.  Neurological:     Mental Status: He is alert.  Psychiatric:        Speech: Speech normal.        Behavior: Behavior normal.

## 2022-08-01 NOTE — Patient Instructions (Addendum)
Pending x-ray of hand to evaluate for fracture. I provided you with a prescription of a topical ibuprofen, Voltaren gel.  Please ice 20 minutes twice per day. You may resume losartan 12.5 mg daily.  Recheck labs in 1 week.

## 2022-08-01 NOTE — Assessment & Plan Note (Signed)
Blood pressure slightly elevated today.  Previously on losartan; suspended due to AKI, since resolved.  Secure chat with PCP, Dr. Clent Ridges whom agreed with resuming losartan 12.5 mg.  Patient will keep upcoming follow-up appointment with her 08/08/22. BMP in one week.

## 2022-08-01 NOTE — Assessment & Plan Note (Signed)
Question if arthritic versus injury from work.  Pending x-ray.  Trial ice, Voltaren gel.  Advised against acetaminophen at this time due to elevation in LFTs

## 2022-08-02 ENCOUNTER — Ambulatory Visit: Payer: PRIVATE HEALTH INSURANCE

## 2022-08-02 ENCOUNTER — Other Ambulatory Visit: Payer: PRIVATE HEALTH INSURANCE

## 2022-08-02 DIAGNOSIS — M79642 Pain in left hand: Secondary | ICD-10-CM

## 2022-08-03 ENCOUNTER — Telehealth: Payer: Self-pay

## 2022-08-03 NOTE — Telephone Encounter (Signed)
Spoke with patient and advised results have not been posted and he would have access to them as soon at the images are read.

## 2022-08-03 NOTE — Telephone Encounter (Signed)
Patient states he is calling back to see if we have the results of the x-rays of his hand that were done yesterday.

## 2022-08-08 ENCOUNTER — Ambulatory Visit: Payer: PRIVATE HEALTH INSURANCE | Admitting: Family Medicine

## 2022-08-08 VITALS — BP 126/82 | HR 84 | Temp 97.9°F | Resp 16 | Ht 70.0 in | Wt 168.5 lb

## 2022-08-08 DIAGNOSIS — M25542 Pain in joints of left hand: Secondary | ICD-10-CM | POA: Diagnosis not present

## 2022-08-08 DIAGNOSIS — F10288 Alcohol dependence with other alcohol-induced disorder: Secondary | ICD-10-CM | POA: Diagnosis not present

## 2022-08-08 DIAGNOSIS — I10 Essential (primary) hypertension: Secondary | ICD-10-CM | POA: Diagnosis not present

## 2022-08-08 DIAGNOSIS — R7989 Other specified abnormal findings of blood chemistry: Secondary | ICD-10-CM | POA: Diagnosis not present

## 2022-08-08 DIAGNOSIS — R1319 Other dysphagia: Secondary | ICD-10-CM

## 2022-08-08 MED ORDER — PANTOPRAZOLE SODIUM 40 MG PO TBEC: 40.0000 mg | DELAYED_RELEASE_TABLET | Freq: Two times a day (BID) | ORAL | 0 refills | Status: DC

## 2022-08-08 NOTE — Patient Instructions (Signed)
It was a pleasure meeting you today. Thank you for allowing me to take part in your health care.  Our goals for today as we discussed include:  Continue Losartan 12.5 mg daily  Will check blood work today  Follow up with xray results of left hand  Recommend Tetanus Vaccination.  This is given every 10 years.   Recommend Shingles vaccine.  This is a 2 dose series and can be given at your local pharmacy.  Please talk to your pharmacist about this.   Refill sent for Protonix   If you have any questions or concerns, please do not hesitate to call the office at [phone].  I look forward to our next visit and until then take care and stay safe.  Regards,   Dana Allan, MD   [facility]

## 2022-08-08 NOTE — Progress Notes (Signed)
SUBJECTIVE:   Chief Complaint  Patient presents with   Alcohol Problem   HPI Patient presents to clinic for follow-up hypertension  Recently seen in clinic and losartan 12.5 mg was restarted.  Tolerating medication well.  Has not had repeat BMP completed reports blood pressures at home 138/83.  Denies any chest pain, shortness of breath, visual changes 0  Alcohol dependence Reports 2 shots beer daily.  Left hand pain Recently seen in clinic for left hand pain.  My interpretation of recent left hand x-ray did not show acute fractures/dislocations.  I think likely possible degenerative change at second DIP/PIP joints.  Pain located mostly at the MCP along first second and third digits.  No decrease in range of motion.  Reports continues to have pain however has had some mild improvement.  Denies any fevers, decreased range of motion, decrease in sensation, decreased weakness.  Requesting refill for Protonix 40 mg twice daily Recent esophageal dilation has not been able to follow-up with Dr. Allegra Lai, GI since discharge from hospital.  Has upcoming appointment in August.  Seizure-like activity Noted during recent hospitalization.  Has follow-up with neurology 06/18.  Has not had seizure-like activity since discharge from hospital.  Has had previous admissions for seizure-like activity in the past.    PERTINENT PMH / PSH: Hypertension EtOH dependence H/o CVA OBJECTIVE:  BP 126/82   Pulse 84   Temp 97.9 F (36.6 C)   Resp 16   Ht 5\' 10"  (1.778 m)   Wt 168 lb 8 oz (76.4 kg)   SpO2 97%   BMI 24.18 kg/m    Physical Exam Vitals reviewed.  Constitutional:      General: He is not in acute distress.    Appearance: Normal appearance. He is normal weight. He is not ill-appearing, toxic-appearing or diaphoretic.  Eyes:     General:        Right eye: No discharge.        Left eye: No discharge.  Cardiovascular:     Rate and Rhythm: Normal rate and regular rhythm.     Heart sounds:  Normal heart sounds.  Pulmonary:     Effort: Pulmonary effort is normal.     Breath sounds: Normal breath sounds.  Abdominal:     General: Bowel sounds are normal.  Musculoskeletal:        General: Normal range of motion.     Cervical back: Normal range of motion.  Skin:    General: Skin is warm and dry.  Neurological:     Mental Status: He is alert and oriented to person, place, and time. Mental status is at baseline.  Psychiatric:        Mood and Affect: Mood normal.        Behavior: Behavior normal.        Thought Content: Thought content normal.        Judgment: Judgment normal.     ASSESSMENT/PLAN:  Primary hypertension Assessment & Plan: Chronic.  Recently restarted losartan has not had repeat creatinine Check c-Met today Continue losartan 12.5 mg daily Continue to monitor blood pressure at home, goal less than 130/80    Alcohol dependence with other alcohol-induced disorder [facility]) Assessment & Plan: Chronic use of alcohol since age 52.  Drinks 1/5 of alcohol daily.   His intake of EtOH has been 2 shots of beer daily since discharge from hospital. Continue to encourage EtOH cessation   Arthralgia of left hand Assessment & Plan: Suspect arthritic changes  Await official report from radiologist from recent x-ray Low suspicion for snuffbox injury given pain at MCP joint Will check CBC, uric acid, ESR and CRP   Orders: -     Uric acid -     C-reactive protein -     Sedimentation rate  Esophageal dysphagia -     Pantoprazole Sodium; Take 1 tablet (40 mg total) by mouth 2 (two) times daily before a meal.  Dispense: 30 tablet; Refill: 0  Abnormal LFTs -     Comprehensive metabolic panel   PDMP reviewed  Return in about 6 months (around 02/07/2023).  Dana Allan, MD

## 2022-08-09 ENCOUNTER — Encounter: Payer: Self-pay | Admitting: Family Medicine

## 2022-08-09 LAB — COMPREHENSIVE METABOLIC PANEL
ALT: 75 U/L — ABNORMAL HIGH (ref 0–53)
AST: 142 U/L — ABNORMAL HIGH (ref 0–37)
Albumin: 4.4 g/dL (ref 3.5–5.2)
Alkaline Phosphatase: 61 U/L (ref 39–117)
BUN: 7 mg/dL (ref 6–23)
CO2: 30 mEq/L (ref 19–32)
Calcium: 9.5 mg/dL (ref 8.4–10.5)
Chloride: 94 mEq/L — ABNORMAL LOW (ref 96–112)
Creatinine, Ser: 0.87 mg/dL (ref 0.40–1.50)
GFR: 99.61 mL/min (ref >60.00)
Glucose, Bld: 90 mg/dL (ref 70–99)
Potassium: 3.4 mEq/L — ABNORMAL LOW (ref 3.5–5.1)
Sodium: 134 mEq/L — ABNORMAL LOW (ref 135–145)
Total Bilirubin: 0.8 mg/dL (ref 0.2–1.2)
Total Protein: 7.9 g/dL (ref 6.0–8.3)

## 2022-08-09 LAB — C-REACTIVE PROTEIN: CRP: <1 mg/dL (ref 0.5–20.0)

## 2022-08-09 LAB — URIC ACID: Uric Acid, Serum: 7.6 mg/dL (ref 4.0–7.8)

## 2022-08-09 LAB — SEDIMENTATION RATE: Sed Rate: 7 mm/hr (ref 0–20)

## 2022-08-12 ENCOUNTER — Other Ambulatory Visit: Payer: Self-pay

## 2022-08-12 DIAGNOSIS — M25532 Pain in left wrist: Secondary | ICD-10-CM

## 2022-08-14 ENCOUNTER — Encounter: Payer: Self-pay | Admitting: Family Medicine

## 2022-08-14 DIAGNOSIS — M25542 Pain in joints of left hand: Secondary | ICD-10-CM | POA: Insufficient documentation

## 2022-08-14 NOTE — Assessment & Plan Note (Signed)
Chronic.  Recently restarted losartan has not had repeat creatinine Check c-Met today Continue losartan 12.5 mg daily Continue to monitor blood pressure at home, goal less than 130/80

## 2022-08-14 NOTE — Assessment & Plan Note (Signed)
Chronic use of alcohol since age 52.  Drinks 1/5 of alcohol daily.   His intake of EtOH has been 2 shots of beer daily since discharge from hospital. Continue to encourage EtOH cessation

## 2022-08-14 NOTE — Assessment & Plan Note (Signed)
Suspect arthritic changes Await official report from radiologist from recent x-ray Low suspicion for snuffbox injury given pain at MCP joint Will check CBC, uric acid, ESR and CRP

## 2022-08-16 ENCOUNTER — Other Ambulatory Visit: Payer: Self-pay | Admitting: Family

## 2022-08-16 ENCOUNTER — Other Ambulatory Visit: Payer: PRIVATE HEALTH INSURANCE

## 2022-08-16 DIAGNOSIS — M79642 Pain in left hand: Secondary | ICD-10-CM

## 2022-08-16 NOTE — Progress Notes (Signed)
close

## 2022-08-18 ENCOUNTER — Encounter: Payer: Self-pay | Admitting: Family Medicine

## 2022-08-18 ENCOUNTER — Other Ambulatory Visit: Payer: Self-pay | Admitting: Family Medicine

## 2022-08-18 MED ORDER — FOLIC ACID 1 MG PO TABS: 1.0000 mg | ORAL_TABLET | Freq: Every day | ORAL | 3 refills | Status: DC

## 2022-08-22 ENCOUNTER — Encounter: Payer: Self-pay | Admitting: Family Medicine

## 2022-08-26 ENCOUNTER — Encounter: Payer: Self-pay | Admitting: Neurology

## 2022-08-26 ENCOUNTER — Ambulatory Visit: Payer: PRIVATE HEALTH INSURANCE | Admitting: Neurology

## 2022-08-26 VITALS — BP 112/92 | HR 75 | Ht 70.0 in | Wt 170.0 lb

## 2022-08-26 DIAGNOSIS — Z8673 Personal history of transient ischemic attack (TIA), and cerebral infarction without residual deficits: Secondary | ICD-10-CM

## 2022-08-26 DIAGNOSIS — Z87898 Personal history of other specified conditions: Secondary | ICD-10-CM | POA: Diagnosis not present

## 2022-08-26 DIAGNOSIS — I672 Cerebral atherosclerosis: Secondary | ICD-10-CM | POA: Diagnosis not present

## 2022-08-26 NOTE — Progress Notes (Unsigned)
NEUROLOGY FOLLOW UP OFFICE NOTE  Levi Mejia [id number] 03-12-1970  HISTORY OF PRESENT ILLNESS: I had the pleasure of seeing Levi Mejia in follow-up in the neurology clinic on 08/26/2022.  The patient was last seen 2 years ago when he presented for evaluation of an episode of loss of consciousness with shaking that occurred on 04/29/2020. Records and images were personally reviewed where available. Brain MRI without contrast done 05/2020 at [facility] did not show any acute changes, there were scattered white matter changes likely due to small vessel disease. His 1-hour EEG in 04/2020 was normal. A 24-hour EEG was discussed but due to scheduling difficulties, not done. He was lost to follow-up and presents today for evaluation after hospital admission in 03/2022 for stroke. Notes reviewed, he had not been feeling well for a few days, fell 4 days prior to ER admission, then came to ER on 04/07/22 for dizziness, left-sided weakness and numbness, vomiting, unsteady sensation. BP was 142/116. Exam reported moving right side of perioral muscles less but appears to due a tic-like contraction on right, face symmetric when smiling, 4/5 on left, decreased sensation on left. LFTs were elevated. I personally reviewed MRI brain without contrast, no acute changes. There were old lacunar infarcts in the right lentiform nucleus and bilateral periventricular white matter, greater on the right; scattered foci of hemosiderin deposition primarily within the deep gray nuclei, sulcal hemosiderin deposition in the medial right parietal lobe. 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 Echocardiogram showed normal EF 60-65%, mild LV hypertrophy, grade I diastolic dysfunction. Neurology recommended to continue aspirin (versus DAPT given microhemorrhages), unable to continue statin due to elevated LFTs, follow-up as outpatient.   He recalls the incident in February as mostly affecting his left hand, not arm. He states he did something at work and his left hand hurt. He notes left hand numbness/tingling, he denied any speech or vision changes, headache, dizziness, neck pain. He has some back pain. He denies any loss of consciousness since 2022. I discussed notes that state his last "alcoholic seizure" was 1 year ago, he states he did not lose consciousness a year ago, the last time was in 04/2020. He states he has cut down on alcohol use, drinking a fifth in 2 weeks. He feels great, he has not felt better in a long time. He denies any staring/unresponsive episodes, gaps in time, olfactory/gustatory hallucinations, myoclonic jerks. He lives with his wife and daughter.   Lipid Panel     Component Value Date/Time   CHOL 228 (H) 06/23/2022 1056   TRIG 74.0 06/23/2022 1056   HDL 95.50 06/23/2022 1056   CHOLHDL 2 06/23/2022 1056   VLDL 14.8 06/23/2022 1056   LDLCALC 118 (H) 06/23/2022 1056   Lab Results  Component Value Date   HGBA1C 5.1 06/23/2022     Chemistry      Component Value Date/Time   NA 134 (L) 08/08/2022 1520   NA 133 (L) 03/25/2014 0729   K 3.4 (L) 08/08/2022 1520   K 3.2 (L) 03/25/2014 0729   CL  94 (L) 08/08/2022 1520   CL 96 (L) 03/25/2014 0729   CO2 30 08/08/2022 1520   CO2 26 03/25/2014 0729   BUN 7 08/08/2022 1520   BUN 6 (L) 03/25/2014 0729   CREATININE 0.87 08/08/2022 1520   CREATININE 1.17 03/25/2014 0729      Component Value Date/Time   CALCIUM 9.5  08/08/2022 1520   CALCIUM 9.4 03/25/2014 0729   ALKPHOS 61 08/08/2022 1520   ALKPHOS 81 03/25/2014 0729   AST 142 (H) 08/08/2022 1520   AST 44 (H) 03/25/2014 0729   ALT 75 (H) 08/08/2022 1520   ALT 46 03/25/2014 0729   BILITOT 0.8 08/08/2022 1520   BILITOT 1.1 (H) 03/25/2014 0729         History on Initial Assessment 05/05/2020: This is a pleasant 52 year old right-handed man with a history of hyperlipidemia, alcohol use, presenting for evaluation of seizure that occurred last 04/29/2020 while he was at work. He has poor sleep, usually getting 4 hours of sleep, however the night prior he only got 2 hours of sleep. He woke up feeling more tired than usual and did not drink much water that day. He did not have breakfast and had a couple of bites a lunch, then at around 2:30pm he recalls walking back to his toolbox. His next recollection was waking up on the ground with EM around him. Around 20 minutes prior to the event, he recalls feeling like there was something in front of him where he held both arms up quickly, like something startled him but nothing was there. He denies any focal weakness, tongue bite or incontinence. He felt disoriented when he woke up, then was back to baseline in the ER. He felt sore the next day with a headache where he hit his head. He states he got conflicting stories on what happened. A co-worker saw him stop walking mid-stride then fall over, the rolled him on his side and someone said he was shaking a little. Witnesses said there was foam coming out of his mouth. He was brought to [facility] ER, records were reviewed. Per notes, he was brought in by EMS after a "reported 2-3 minutes grand mal seizure, reported post-ictal state during transport with possible petit mal seizure in route." There is no description of the petit mal seizure. Bloodwork showed Na of 1332 Ca 8.4,AST 79, K 3.2, Mg 1.5. Glucose was 95. EtOH level <10, UDS negative. Head CT no acute changes. EKG showed sinus  tachycardia.  He denies any prior history of seizures. He had an episode of loss of consciousness in the setting of alcohol intoxication in 2019. He was drunk and [name] heard him fall in the bathroom. She came to find him unconscious, he started waking up before EMS arrived. He was given Librium for alcohol withdrawal which "jacked me up," he was shaking so bad with tachycardia, so he went to the ER 2 weeks later because he was not feeling well. 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 They deny any staring/unresponsive episodes, gaps in time, olfactory/gustatory hallucinations, deja vu, rising epigastric sensation, focal numbness/tingling/weakness, myoclonic jerks. He denies any further headaches, no dizziness, diplopia, dysarthria/dysphagia, neck/back pain, bowel/bladder dysfunction. Memory is good. He has some RLS type symptoms when he tries to sleep. He was drinking 3-5 shots of vodka daily, but reports drinking more in the past. He  has not had any alcohol since 04/28/2020.   He had a normal birth and early development.  There is no history of febrile convulsions, CNS infections such as meningitis/encephalitis, significant traumatic brain injury, neurosurgical procedures, or family history of seizures.   PAST MEDICAL HISTORY: Past Medical History:  Diagnosis Date   Alcohol abuse    Attention deficit disorder without mention of hyperactivity    HTN (hypertension)    Hyperlipidemia 07/03/2022   ICH (intracerebral hemorrhage) (HCC)    Seizures (HCC)    Stroke [facility])     MEDICATIONS: Current Outpatient Medications on File Prior to Visit  Medication Sig Dispense Refill   aspirin EC 81 MG tablet Take 1 tablet (81 mg total) by mouth daily. 30 tablet 0   diclofenac Sodium (VOLTAREN) 1 % GEL Apply 4 g topically 3 (three) times daily as needed. 50 g 0   folic acid  (FOLVITE) 1 MG tablet Take 1 tablet (1 mg total) by mouth daily. 90 tablet 3   losartan (COZAAR) 25 MG tablet Take 0.5 tablets (12.5 mg total) by mouth daily. 90 tablet 1   Multiple Vitamin (MULTIVITAMIN WITH MINERALS) TABS tablet Take 1 tablet by mouth daily.     pantoprazole (PROTONIX) 40 MG tablet Take 1 tablet (40 mg total) by mouth 2 (two) times daily before a meal. 30 tablet 0   thiamine (VITAMIN B1) 100 MG tablet Take 100 mg by mouth daily.     No current facility-administered medications on file prior to visit.    ALLERGIES: Allergies  Allergen Reactions   Lactose Intolerance (Gi)     FAMILY HISTORY: Family History  Problem Relation Age of Onset   Heart attack Father 52   Hypertension Father    Coronary artery disease Mother 51       Stent   Cancer Mother        Bone   Hypertension Mother    Hypertension Brother    Diabetes Neg Hx    Prostate cancer Neg Hx    Colon cancer Neg Hx    Heart disease Neg Hx     SOCIAL HISTORY: Social History   Socioeconomic History   Marital status: Married    Spouse name: Not on file   Number of children: 1   Years of education: Not on file   Highest education level: 12th grade  Occupational History   Occupation: Curator    Comment: Engineering geologist  Tobacco Use   Smoking status: Never   Smokeless tobacco: Never  Vaping Use   Vaping Use: Former  Substance and Sexual Activity   Alcohol use: Yes    Comment: Regular   Drug use: Not Currently    Types: Marijuana   Sexual activity: Not Currently  Other Topics Concern   Not on file  Social History Narrative   Divorced   Common law marriage since 2006      1 daughter      Right handed   Social Determinants of Health   Financial Resource Strain: Low Risk  (08/08/2022)   Overall Financial Resource Strain (CARDIA)    Difficulty of Paying Living Expenses: Not hard at all  Food Insecurity: No Food Insecurity (08/08/2022)   Hunger Vital Sign    Worried About Running Out  of Food in the Last Year: Never true    Ran Out of Food in the Last Year: Never true  Transportation Needs: No Transportation Needs (08/08/2022)   PRAPARE - Transportation    Lack  of Transportation (Medical): No    Lack of Transportation (Non-Medical): No  Physical Activity: Sufficiently Active (08/08/2022)   Exercise Vital Sign    Days of Exercise per Week: 5 days    Minutes of Exercise per Session: 40 min  Recent Concern: Physical Activity - Inactive (07/14/2022)   Exercise Vital Sign    Days of Exercise per Week: 0 days    Minutes of Exercise per Session: 0 min  Stress: No Stress Concern Present (08/08/2022)   [employer] of Occupational Health - Occupational Stress Questionnaire    Feeling of Stress : Not at all  Social Connections: Moderately Isolated (08/08/2022)   Social Connection and Isolation Panel [NHANES]    Frequency of Communication with Friends and Family: More than three times a week    Frequency of Social Gatherings with Friends and Family: More than three times a week    Attends Religious Services: Never    Database administrator or Organizations: No    Attends Banker Meetings: Never    Marital Status: Married  Catering manager Violence: Not At Risk (06/30/2022)   Humiliation, Afraid, Rape, and Kick questionnaire    Fear of Current or Ex-Partner: No    Emotionally Abused: No    Physically Abused: No    Sexually Abused: No     PHYSICAL EXAM: Vitals:   08/26/22 0931  BP: (!) 112/92  Pulse: 75  SpO2: 98%   General: No acute distress Head:  Normocephalic/atraumatic Skin/Extremities: No rash, no edema Neurological Exam: alert and awake. No aphasia or dysarthria. Fund of knowledge is appropriate.  Attention and concentration are normal.   Cranial nerves: Pupils equal, round. Extraocular movements intact with no nystagmus. Visual fields full.  No facial asymmetry.  Motor: Bulk and tone normal, muscle strength 5/5 throughout with no pronator drift.  Sensation intact to all modalities. Reflexes +2 throughout. Finger to nose testing intact.  Gait narrow-based and steady, able to tandem walk adequately.  Romberg negative.   IMPRESSION: This is a pleasant 52 yo RH man with a history of hyperlipidemia, alcohol use, seen previously in 2022 after a seizure at work on 04/29/2020. MRI brain no acute changes, EEG in 2022 normal. He was lost to follow-up and today reports he has been doing well with no further episodes of loss of consciousness since 2022, he attributes this event was due to alcohol. He has cut down on alcohol intake. He was admitted in February for left-sided weakness, MRI brain no acute changes. He states left hand was weak due to pain. We discussed brain MRI showing chronic lacunar infarcts, as well as intracranial stenosis. We discussed the importance of control of vascular risk factors, goal LDL <70. He was not started on statin due to elevated LFTs, these are trending down, continue to monitor and treat hyperlipidemia with PCP. Continue daily aspirin. He was encouraged to continue with gradual alcohol reduction and eventual cessation. Note for work done today as he denies any further episodes of loss of consciousness since 2022, he is aware of [place] driving laws to stop driving after an episode of loss of consciousness until 6 months event-free. Follow-up as needed, call for any changes.    Thank you for allowing me to participate in his care.  Please do not hesitate to call for any questions or concerns.    Patrcia Dolly, M.D.   CC: Dr. Clent Ridges

## 2022-08-26 NOTE — Patient Instructions (Signed)
Good to see you.   Continue working on cholesterol levels with PCP  2. Continue working on alcohol reduction  3. Continue daily aspirin  4. Follow-up as needed, call for any changes

## 2022-09-06 ENCOUNTER — Telehealth: Payer: Self-pay | Admitting: Family Medicine

## 2022-09-06 NOTE — Telephone Encounter (Signed)
Pt called in stating that he just found out this morning that his wife has shingles, and he wants to know if he needs to take any antiviral meds for now??

## 2022-09-06 NOTE — Telephone Encounter (Signed)
Phone call to pt, he reports that he has had chicken pox.  Let him know to call back if he breaks out in a rash but would not need antivirals at this time.  Pt verbalized understanding.

## 2022-10-05 ENCOUNTER — Other Ambulatory Visit: Payer: Self-pay | Admitting: Family Medicine

## 2022-10-05 DIAGNOSIS — R1319 Other dysphagia: Secondary | ICD-10-CM

## 2022-10-06 MED ORDER — PANTOPRAZOLE SODIUM 40 MG PO TBEC
40.0000 mg | DELAYED_RELEASE_TABLET | Freq: Two times a day (BID) | ORAL | 0 refills | Status: DC
Start: 2022-10-06 — End: 2023-09-18

## 2022-10-14 ENCOUNTER — Other Ambulatory Visit: Payer: Self-pay

## 2022-10-14 ENCOUNTER — Encounter: Payer: Self-pay | Admitting: Nurse Practitioner

## 2022-10-14 ENCOUNTER — Emergency Department
Admission: EM | Admit: 2022-10-14 | Discharge: 2022-10-15 | Disposition: A | Discharge status: Home / Self Care | Payer: PRIVATE HEALTH INSURANCE | Source: Home / Self Care | Attending: Emergency Medicine | Admitting: Emergency Medicine

## 2022-10-14 ENCOUNTER — Emergency Department: Payer: PRIVATE HEALTH INSURANCE

## 2022-10-14 ENCOUNTER — Ambulatory Visit: Payer: PRIVATE HEALTH INSURANCE | Admitting: Nurse Practitioner

## 2022-10-14 VITALS — BP 130/84 | HR 84 | Temp 98.0°F | Resp 16 | Ht 70.0 in | Wt 157.1 lb

## 2022-10-14 DIAGNOSIS — R5383 Other fatigue: Secondary | ICD-10-CM

## 2022-10-14 DIAGNOSIS — I10 Essential (primary) hypertension: Secondary | ICD-10-CM | POA: Insufficient documentation

## 2022-10-14 DIAGNOSIS — E876 Hypokalemia: Secondary | ICD-10-CM | POA: Insufficient documentation

## 2022-10-14 DIAGNOSIS — R112 Nausea with vomiting, unspecified: Secondary | ICD-10-CM | POA: Insufficient documentation

## 2022-10-14 DIAGNOSIS — R7401 Elevation of levels of liver transaminase levels: Secondary | ICD-10-CM | POA: Insufficient documentation

## 2022-10-14 DIAGNOSIS — F10239 Alcohol dependence with withdrawal, unspecified: Secondary | ICD-10-CM | POA: Diagnosis not present

## 2022-10-14 DIAGNOSIS — F1021 Alcohol dependence, in remission: Secondary | ICD-10-CM

## 2022-10-14 DIAGNOSIS — R634 Abnormal weight loss: Secondary | ICD-10-CM | POA: Diagnosis not present

## 2022-10-14 DIAGNOSIS — E860 Dehydration: Secondary | ICD-10-CM | POA: Diagnosis not present

## 2022-10-14 DIAGNOSIS — R17 Unspecified jaundice: Secondary | ICD-10-CM

## 2022-10-14 LAB — POCT GLYCOSYLATED HEMOGLOBIN (HGB A1C)

## 2022-10-14 LAB — CBC
HCT: 44.1 % (ref 39.0–52.0)
Hemoglobin: 15.9 g/dL (ref 13.0–17.0)
MCH: 34.7 pg — ABNORMAL HIGH (ref 26.0–34.0)
MCHC: 36.1 g/dL — ABNORMAL HIGH (ref 30.0–36.0)
MCV: 96.3 fL (ref 80.0–100.0)
Platelets: 169 10*3/uL (ref 150–400)
RBC: 4.58 MIL/uL (ref 4.22–5.81)
RDW: 10.9 % — ABNORMAL LOW (ref 11.5–15.5)
WBC: 7.4 10*3/uL (ref 4.0–10.5)
nRBC: 0 % (ref 0.0–0.2)

## 2022-10-14 LAB — COMPREHENSIVE METABOLIC PANEL
ALT: 158 U/L — ABNORMAL HIGH (ref 0–44)
AST: 436 U/L — ABNORMAL HIGH (ref 15–41)
Albumin: 4.5 g/dL (ref 3.5–5.0)
Alkaline Phosphatase: 76 U/L (ref 38–126)
Anion gap: 18 — ABNORMAL HIGH (ref 5–15)
BUN: 11 mg/dL (ref 6–20)
CO2: 29 mmol/L (ref 22–32)
Calcium: 8.9 mg/dL (ref 8.9–10.3)
Chloride: 83 mmol/L — ABNORMAL LOW (ref 98–111)
Creatinine, Ser: 0.81 mg/dL (ref 0.61–1.24)
GFR, Estimated: >60 mL/min (ref >60)
Glucose, Bld: 104 mg/dL — ABNORMAL HIGH (ref 70–99)
Potassium: 2.7 mmol/L — CL (ref 3.5–5.1)
Sodium: 130 mmol/L — ABNORMAL LOW (ref 135–145)
Total Bilirubin: 3.3 mg/dL — ABNORMAL HIGH (ref 0.3–1.2)
Total Protein: 8.6 g/dL — ABNORMAL HIGH (ref 6.5–8.1)

## 2022-10-14 LAB — URINALYSIS, ROUTINE W REFLEX MICROSCOPIC
Bacteria, UA: NONE SEEN
Bilirubin Urine: NEGATIVE
Glucose, UA: NEGATIVE mg/dL
Ketones, ur: 5 mg/dL — AB
Leukocytes,Ua: NEGATIVE
Nitrite: NEGATIVE
Protein, ur: 30 mg/dL — AB
Specific Gravity, Urine: 1.012 (ref 1.005–1.030)
pH: 6 (ref 5.0–8.0)

## 2022-10-14 LAB — ETHANOL: Alcohol, Ethyl (B): 314 mg/dL (ref <10)

## 2022-10-14 LAB — LIPASE, BLOOD: Lipase: 88 U/L — ABNORMAL HIGH (ref 11–51)

## 2022-10-14 LAB — MAGNESIUM: Magnesium: 1.8 mg/dL (ref 1.7–2.4)

## 2022-10-14 LAB — GLUCOSE, POCT (MANUAL RESULT ENTRY): POC Glucose: 115 mg/dl — AB (ref 70–99)

## 2022-10-14 MED ORDER — POTASSIUM CHLORIDE 10 MEQ/100ML IV SOLN
10.0000 meq | INTRAVENOUS | Status: AC
  Administered 2022-10-14 (×2): 10 meq via INTRAVENOUS
  Filled 2022-10-14 (×2): qty 100

## 2022-10-14 MED ORDER — SODIUM CHLORIDE 0.9 % IV BOLUS
1000.0000 mL | Freq: Once | INTRAVENOUS | Status: AC
  Administered 2022-10-14: 1000 mL via INTRAVENOUS

## 2022-10-14 MED ORDER — ONDANSETRON HCL 4 MG/2ML IJ SOLN
4.0000 mg | Freq: Once | INTRAMUSCULAR | Status: AC
  Administered 2022-10-14: 4 mg via INTRAVENOUS
  Filled 2022-10-14: qty 2

## 2022-10-14 MED ORDER — ONDANSETRON HCL 4 MG PO TABS: 4.0000 mg | ORAL_TABLET | Freq: Four times a day (QID) | ORAL | 0 refills | Status: AC | PRN

## 2022-10-14 MED ORDER — POTASSIUM CHLORIDE CRYS ER 20 MEQ PO TBCR
40.0000 meq | EXTENDED_RELEASE_TABLET | Freq: Once | ORAL | Status: AC
  Administered 2022-10-14: 40 meq via ORAL
  Filled 2022-10-14: qty 2

## 2022-10-14 MED ORDER — POTASSIUM CHLORIDE CRYS ER 20 MEQ PO TBCR: 20.0000 meq | EXTENDED_RELEASE_TABLET | Freq: Every day | ORAL | 0 refills | Status: DC

## 2022-10-14 NOTE — Assessment & Plan Note (Addendum)
Yellow sclera bilaterally. No appetite from last 3 to 4 days.  Drinking just protein shake twice a day.  No abdominal tenderness. Questionable jaundice, need further workup. Advised patient to go to the ER for further evaluation.

## 2022-10-14 NOTE — Progress Notes (Signed)
Established Patient Office Visit  Subjective:  Patient ID: Levi Mejia, male    DOB: 02-Mar-1970  Age: 52 y.o. MRN: [id number]  CC:  Chief Complaint  Patient presents with   Fatigue    Not eating and body is sore X 1 week    HPI  Levi Mejia [facility] presents for fatigue and body ache. He has h/o He started a new job 2 weeks ago.  Patient had lost 13 Lb in 1.5 month.   He feel nauseous and do not feel like eating.  He is  drinking protein shakes and not eating from last 4- 5 days.  He has 4 shot this afternoon.   He also reports having 1-2 episodes of stiffness few days go that his  wife reported. He has history of seizure  and last seizure a year ago. He is not on any seizure mediation.   HPI  Past Medical History:  Diagnosis Date   Alcohol abuse    Attention deficit disorder without mention of hyperactivity    HTN (hypertension)    Hyperlipidemia 07/03/2022   ICH (intracerebral hemorrhage) (HCC)    Seizures (HCC)    Stroke [facility])     Past Surgical History:  Procedure Laterality Date   ESOPHAGOGASTRODUODENOSCOPY (EGD) WITH PROPOFOL N/A 07/04/2022   Procedure: ESOPHAGOGASTRODUODENOSCOPY (EGD) WITH PROPOFOL;  Surgeon: Toney Reil, MD;  Location: [facility] ENDOSCOPY;  Service: Endoscopy;  Laterality: N/A;   Hand tendon reattachment  02/28/1990   Right hand   toe reattachment  02/28/1994   Right 1st toe    Family History  Problem Relation Age of Onset   Heart attack Father 59   Hypertension Father    Coronary artery disease Mother 70       Stent   Cancer Mother        Bone   Hypertension Mother    Hypertension Brother    Diabetes Neg Hx    Prostate cancer Neg Hx    Colon cancer Neg Hx    Heart disease Neg Hx     Social History   Socioeconomic History   Marital status: Married    Spouse name: Not on file   Number of children: 1   Years of education: Not on file   Highest education level: 12th grade  Occupational History    Occupation: Curator    Comment: Engineering geologist  Tobacco Use   Smoking status: Never   Smokeless tobacco: Never  Vaping Use   Vaping status: Former  Substance and Sexual Activity   Alcohol use: Yes    Comment: drinks 1 bottle in 4 days.   Drug use: Not Currently    Types: Marijuana   Sexual activity: Not Currently  Other Topics Concern   Not on file  Social History Narrative   Divorced   Common law marriage since 2006      1 daughter      Right handed   Social Determinants of Health   Financial Resource Strain: Low Risk  (08/08/2022)   Overall Financial Resource Strain (CARDIA)    Difficulty of Paying Living Expenses: Not hard at all  Food Insecurity: No Food Insecurity (08/08/2022)   Hunger Vital Sign    Worried About Running Out of Food in the Last Year: Never true    Ran Out of Food in the Last Year: Never true  Transportation Needs: No Transportation Needs (08/08/2022)   PRAPARE - Administrator, Civil Service (Medical): No  Lack of Transportation (Non-Medical): No  Physical Activity: Sufficiently Active (08/08/2022)   Exercise Vital Sign    Days of Exercise per Week: 5 days    Minutes of Exercise per Session: 40 min  Recent Concern: Physical Activity - Inactive (07/14/2022)   Exercise Vital Sign    Days of Exercise per Week: 0 days    Minutes of Exercise per Session: 0 min  Stress: No Stress Concern Present (08/08/2022)   [employer] of Occupational Health - Occupational Stress Questionnaire    Feeling of Stress : Not at all  Social Connections: Moderately Isolated (08/08/2022)   Social Connection and Isolation Panel [NHANES]    Frequency of Communication with Friends and Family: More than three times a week    Frequency of Social Gatherings with Friends and Family: More than three times a week    Attends Religious Services: Never    Database administrator or Organizations: No    Attends Banker Meetings: Never    Marital Status:  Married  Catering manager Violence: Not At Risk (06/30/2022)   Humiliation, Afraid, Rape, and Kick questionnaire    Fear of Current or Ex-Partner: No    Emotionally Abused: No    Physically Abused: No    Sexually Abused: No     No facility-administered medications prior to visit.   Outpatient Medications Prior to Visit  Medication Sig Dispense Refill   aspirin EC 81 MG tablet Take 1 tablet (81 mg total) by mouth daily. 30 tablet 0   diclofenac Sodium (VOLTAREN) 1 % GEL Apply 4 g topically 3 (three) times daily as needed. 50 g 0   folic acid (FOLVITE) 1 MG tablet Take 1 tablet (1 mg total) by mouth daily. 90 tablet 3   losartan (COZAAR) 25 MG tablet Take 0.5 tablets (12.5 mg total) by mouth daily. 90 tablet 1   Multiple Vitamin (MULTIVITAMIN WITH MINERALS) TABS tablet Take 1 tablet by mouth daily.     pantoprazole (PROTONIX) 40 MG tablet Take 1 tablet (40 mg total) by mouth 2 (two) times daily before a meal. 30 tablet 0   thiamine (VITAMIN B1) 100 MG tablet Take 100 mg by mouth daily. (Patient not taking: Reported on 08/26/2022)      Allergies  Allergen Reactions   Lactose Intolerance (Gi)     ROS Review of Systems Negative unless indicated in HPI.    Objective:    Physical Exam Constitutional:      Appearance: Normal appearance.  HENT:     Right Ear: Tympanic membrane normal. Tympanic membrane is not erythematous.     Left Ear: Tympanic membrane normal. Tympanic membrane is not erythematous.     Nose:     Right Turbinates: Not enlarged.     Left Turbinates: Not enlarged.     Right Sinus: No maxillary sinus tenderness or frontal sinus tenderness.     Left Sinus: No maxillary sinus tenderness or frontal sinus tenderness.     Mouth/Throat:     Mouth: Mucous membranes are moist.     Pharynx: No pharyngeal swelling, oropharyngeal exudate or posterior oropharyngeal erythema.     Tonsils: No tonsillar exudate.  Eyes:     Comments: Yellow sclera bilaterally  Cardiovascular:      Rate and Rhythm: Normal rate and regular rhythm.  Pulmonary:     Effort: Pulmonary effort is normal.     Breath sounds: Normal breath sounds. No stridor. No wheezing.  Neurological:     General: No  focal deficit present.     Mental Status: He is alert and oriented to person, place, and time. Mental status is at baseline.  Psychiatric:        Mood and Affect: Mood normal.        Behavior: Behavior normal.        Thought Content: Thought content normal.        Judgment: Judgment normal.     BP 130/84   Pulse 84   Temp 98 F (36.7 C)   Resp 16   Ht 5\' 10"  (1.778 m)   Wt 157 lb 2 oz (71.3 kg)   SpO2 95%   BMI 22.55 kg/m  Wt Readings from Last 3 Encounters:  10/14/22 157 lb 2 oz (71.3 kg)  10/14/22 157 lb 2 oz (71.3 kg)  08/26/22 170 lb (77.1 kg)     Health Maintenance  Topic Date Due   Colonoscopy  Never done   DTaP/Tdap/Td (3 - Td or Tdap) 06/16/2020   Zoster Vaccines- Shingrix (1 of 2) Never done   INFLUENZA VACCINE  09/29/2022   COVID-19 Vaccine (3 - 2023-24 season) 07/14/2027 (Originally 10/29/2021)   Hepatitis C Screening  Completed   HIV Screening  Completed   HPV VACCINES  Aged Out    There are no preventive care reminders to display for this patient.  Lab Results  Component Value Date   TSH 1.13 06/23/2022   Lab Results  Component Value Date   WBC 7.4 10/14/2022   HGB 15.9 10/14/2022   HCT 44.1 10/14/2022   MCV 96.3 10/14/2022   PLT 169 10/14/2022   Lab Results  Component Value Date   NA 130 (L) 10/14/2022   K 2.7 (LL) 10/14/2022   CO2 29 10/14/2022   GLUCOSE 104 (H) 10/14/2022   BUN 11 10/14/2022   CREATININE 0.81 10/14/2022   BILITOT 3.3 (H) 10/14/2022   ALKPHOS 76 10/14/2022   AST 436 (H) 10/14/2022   ALT 158 (H) 10/14/2022   PROT 8.6 (H) 10/14/2022   ALBUMIN 4.5 10/14/2022   CALCIUM 8.9 10/14/2022   ANIONGAP 18 (H) 10/14/2022   GFR 99.61 08/08/2022   Lab Results  Component Value Date   CHOL 228 (H) 06/23/2022   Lab Results   Component Value Date   HDL 95.50 06/23/2022   Lab Results  Component Value Date   LDLCALC 118 (H) 06/23/2022   Lab Results  Component Value Date   TRIG 74.0 06/23/2022   Lab Results  Component Value Date   CHOLHDL 2 06/23/2022   Lab Results  Component Value Date   HGBA1C 5.1 06/23/2022      Assessment & Plan:  Scleral icterus Assessment & Plan: Yellow sclera bilaterally. No appetite from last 3 to 4 days.  Drinking just protein shake twice a day.  No abdominal tenderness. Questionable jaundice, need further workup. Advised patient to go to the ER for further evaluation.    Weight loss Assessment & Plan: Lost 11 lb in 1.5 months. POCT BS 115  Orders: -     POCT glucose (manual entry) -     POCT glycosylated hemoglobin (Hb A1C)  Fatigue, unspecified type -     POCT glucose (manual entry) -     POCT glycosylated hemoglobin (Hb A1C)  H/O alcohol dependence (HCC) Assessment & Plan: Patient is a heavy drinker. States had 4 shots this afternoon. Patient has history of alcoholic hepatitis. Counseling provided on health risk risk associated with alcohol consumption.  Follow-up: No follow-ups on file.   Kara Dies, NP

## 2022-10-14 NOTE — Assessment & Plan Note (Addendum)
Lost 11 lb in 1.5 months. POCT BS 115

## 2022-10-14 NOTE — Assessment & Plan Note (Addendum)
Patient is a heavy drinker. States had 4 shots this afternoon. Patient has history of alcoholic hepatitis. Counseling provided on health risk risk associated with alcohol consumption.

## 2022-10-14 NOTE — ED Notes (Signed)
MD Larinda Buttery informed of pt ethanol level of 314 and K+ 2.7

## 2022-10-14 NOTE — Discharge Instructions (Addendum)
You were seen in the Emergency Department today for your vomiting and decreased appetite.  Your blood test showed that your liver enzymes are elevated which I suspect is related to your alcohol use.  Please follow-up closely with your primary care doctor for further evaluation of your symptoms.  I also sent a prescription for nausea medicine and a few days of potassium to your pharmacy.  Return to the ER for new or worsening symptoms.

## 2022-10-14 NOTE — ED Notes (Signed)
Pt denied being able to produce a urine sample at this time. Pt provided with a labeled specimen cup and instructions to return cup to triage nurse desk once it has a clean catch urine sample.

## 2022-10-14 NOTE — ED Triage Notes (Addendum)
Has not eaten in 3-7 days.  Seen by PCP today, sent to ED for evaluation concerned for jaundice.  Drinks 5-6 shots of liquor a day.  Last drank at 1500 today.  AAOx3. Skin warm and dry. Denies abdominal pain, endorses nausea and vomiting every morning.  STates lost 15 lbs in 5 months.  Sclera white

## 2022-10-14 NOTE — ED Provider Notes (Signed)
[facility] Provider Note    Event Date/Time   First MD Initiated Contact with Patient 10/14/22 2010     (approximate)   History   Vomiting   HPI  Levi Mejia is a 52 y.o. male with history of hypertension, alcohol dependence presenting to the emergency department for evaluation of nausea.  Patient reports that for the past 5 days or so he has had poor appetite with associated nausea and episodes of nonbloody vomiting.  He saw his primary care doctor today.  He was concerned about possible scleral icterus and recommended the patient present to the ER.  Patient does report that he drinks 5-6 shots of liquor a day, last drink earlier today.  Has had some weight loss over the past several months.  Denies fevers or chills.  Denies abdominal pain.  Normal bowel movements.     Physical Exam   Triage Vital Signs: ED Triage Vitals  Encounter Vitals Group     BP 10/14/22 1806 (!) 137/92     Systolic BP Percentile --      Diastolic BP Percentile --      Pulse Rate 10/14/22 1806 93     Resp 10/14/22 1806 16     Temp 10/14/22 1806 99.1 F (37.3 C)     Temp Source 10/14/22 1806 Oral     SpO2 10/14/22 1806 97 %     Weight 10/14/22 1802 157 lb 2 oz (71.3 kg)     Height 10/14/22 1802 5\' 10"  (1.778 m)     Head Circumference --      Peak Flow --      Pain Score 10/14/22 1802 0     Pain Loc --      Pain Education --      Exclude from Growth Chart --     Most recent vital signs: Vitals:   10/14/22 2200 10/14/22 2300  BP: (!) 142/100 (!) 142/108  Pulse: 78 92  Resp:    Temp:    SpO2: 100% 100%     General: Awake, interactive  Eyes:  No significant appreciable scleral icterus on my exam CV:  Regular rate, good peripheral perfusion.  Resp:  Lungs clear, unlabored respirations.  Abd:  Soft, nondistended, nontender to palpation throughout the abdomen Neuro:  Symmetric facial movement, fluid speech, 5-5 strength in the bilateral upper and lower  extremities with normal sensation   ED Results / Procedures / Treatments   Labs (all labs ordered are listed, but only abnormal results are displayed) Labs Reviewed  LIPASE, BLOOD - Abnormal; Notable for the following components:      Result Value   Lipase 88 (*)    All other components within normal limits  COMPREHENSIVE METABOLIC PANEL - Abnormal; Notable for the following components:   Sodium 130 (*)    Potassium 2.7 (*)    Chloride 83 (*)    Glucose, Bld 104 (*)    Total Protein 8.6 (*)    AST 436 (*)    ALT 158 (*)    Total Bilirubin 3.3 (*)    Anion gap 18 (*)    All other components within normal limits  CBC - Abnormal; Notable for the following components:   MCH 34.7 (*)    MCHC 36.1 (*)    RDW 10.9 (*)    All other components within normal limits  URINALYSIS, ROUTINE W REFLEX MICROSCOPIC - Abnormal; Notable for the following components:   Color, Urine YELLOW (*)  APPearance CLEAR (*)    Hgb urine dipstick SMALL (*)    Ketones, ur 5 (*)    Protein, ur 30 (*)    All other components within normal limits  ETHANOL - Abnormal; Notable for the following components:   Alcohol, Ethyl (B) 314 (*)    All other components within normal limits  MAGNESIUM     EKG EKG independently reviewed interpreted by myself (ER attending) demonstrates:    RADIOLOGY Imaging independently reviewed and interpreted by myself demonstrates:  Ultrasound demonstrates hepatic steatosis, no CBD dilation or evidence of cholecystitis  PROCEDURES:  Critical Care performed: No  Procedures   MEDICATIONS ORDERED IN ED: Medications  ondansetron (ZOFRAN) injection 4 mg (4 mg Intravenous Given 10/14/22 2107)  sodium chloride 0.9 % bolus 1,000 mL (0 mLs Intravenous Stopped 10/14/22 2224)  potassium chloride 10 mEq in 100 mL IVPB (0 mEq Intravenous Stopped 10/14/22 2221)  potassium chloride SA (KLOR-CON M) CR tablet 40 mEq (40 mEq Oral Given 10/14/22 2317)     IMPRESSION / MDM / ASSESSMENT  AND PLAN / ED COURSE  I reviewed the triage vital signs and the nursing notes.  Differential diagnosis includes, but is not limited to, alcoholic cirrhosis with or without decompensation, biliary obstruction, alcohol associated transaminitis, UTI, pancreatitis, viral illness  Patient's presentation is most consistent with acute presentation with potential threat to life or bodily function.  52 year old male presenting with vomiting, concerns for scleral icterus as an outpatient.  Labs here with CBC without severe derangement.  CMP notable for hypokalemia with a K of 2.7, normal mag at 1.7.  Patient was orally and IV repleted.  His liver enzymes are elevated with AST greater than ALT reflective of likely alcohol associated liver injury.  Lipase slightly elevated, but does not meet diagnostic criteria for pancreatitis and patient without abdominal pain.  Urine without evidence of infection.  EtOH level significantly elevated, but patient with normal neurologic exam reflective of likely chronic alcohol use.  Patient was treated symptomatically with IV fluids and Zofran.  He reports feeling significantly improved on reevaluation.  He was able to tolerate a p.o. trial.  He is comfortable with discharge home and outpatient follow-up.  We did discuss alcohol cessation.  Given his heavy alcohol use, do think this needs to occur under monitoring with his physician who he reports he will closely follow-up with.  Strict return precautions provided.  Patient discharged stable condition.      FINAL CLINICAL IMPRESSION(S) / ED DIAGNOSES   Final diagnoses:  Nausea and vomiting, unspecified vomiting type  Transaminitis     Rx / DC Orders   ED Discharge Orders          Ordered    ondansetron (ZOFRAN) 4 MG tablet  Every 6 hours PRN        10/14/22 2324    potassium chloride SA (KLOR-CON M) 20 MEQ tablet  Daily        10/14/22 2324             Note:  This document was prepared using Dragon voice  recognition software and may include unintentional dictation errors.   Trinna Post, MD 10/14/22 [phone]

## 2022-10-17 ENCOUNTER — Emergency Department: Payer: PRIVATE HEALTH INSURANCE

## 2022-10-17 ENCOUNTER — Other Ambulatory Visit: Payer: Self-pay

## 2022-10-17 ENCOUNTER — Inpatient Hospital Stay
Admission: EM | Admit: 2022-10-17 | Discharge: 2022-10-19 | DRG: 897 | Disposition: A | Discharge status: Home / Self Care | Payer: PRIVATE HEALTH INSURANCE | Attending: Internal Medicine | Admitting: Internal Medicine

## 2022-10-17 DIAGNOSIS — Z8249 Family history of ischemic heart disease and other diseases of the circulatory system: Secondary | ICD-10-CM

## 2022-10-17 DIAGNOSIS — R630 Anorexia: Secondary | ICD-10-CM | POA: Diagnosis present

## 2022-10-17 DIAGNOSIS — Z811 Family history of alcohol abuse and dependence: Secondary | ICD-10-CM

## 2022-10-17 DIAGNOSIS — F10239 Alcohol dependence with withdrawal, unspecified: Principal | ICD-10-CM | POA: Diagnosis present

## 2022-10-17 DIAGNOSIS — F10930 Alcohol use, unspecified with withdrawal, uncomplicated: Secondary | ICD-10-CM | POA: Diagnosis not present

## 2022-10-17 DIAGNOSIS — E871 Hypo-osmolality and hyponatremia: Secondary | ICD-10-CM | POA: Diagnosis present

## 2022-10-17 DIAGNOSIS — E785 Hyperlipidemia, unspecified: Secondary | ICD-10-CM | POA: Diagnosis present

## 2022-10-17 DIAGNOSIS — Z8673 Personal history of transient ischemic attack (TIA), and cerebral infarction without residual deficits: Secondary | ICD-10-CM

## 2022-10-17 DIAGNOSIS — E876 Hypokalemia: Secondary | ICD-10-CM | POA: Diagnosis present

## 2022-10-17 DIAGNOSIS — Z7982 Long term (current) use of aspirin: Secondary | ICD-10-CM | POA: Diagnosis not present

## 2022-10-17 DIAGNOSIS — E739 Lactose intolerance, unspecified: Secondary | ICD-10-CM | POA: Diagnosis present

## 2022-10-17 DIAGNOSIS — Y908 Blood alcohol level of 240 mg/100 ml or more: Secondary | ICD-10-CM | POA: Diagnosis present

## 2022-10-17 DIAGNOSIS — I10 Essential (primary) hypertension: Secondary | ICD-10-CM | POA: Diagnosis present

## 2022-10-17 DIAGNOSIS — F988 Other specified behavioral and emotional disorders with onset usually occurring in childhood and adolescence: Secondary | ICD-10-CM | POA: Diagnosis present

## 2022-10-17 DIAGNOSIS — E860 Dehydration: Secondary | ICD-10-CM | POA: Diagnosis present

## 2022-10-17 DIAGNOSIS — F1020 Alcohol dependence, uncomplicated: Secondary | ICD-10-CM | POA: Diagnosis present

## 2022-10-17 DIAGNOSIS — E440 Moderate protein-calorie malnutrition: Secondary | ICD-10-CM | POA: Diagnosis present

## 2022-10-17 DIAGNOSIS — R7989 Other specified abnormal findings of blood chemistry: Secondary | ICD-10-CM | POA: Diagnosis present

## 2022-10-17 DIAGNOSIS — Z79899 Other long term (current) drug therapy: Secondary | ICD-10-CM

## 2022-10-17 DIAGNOSIS — F10939 Alcohol use, unspecified with withdrawal, unspecified: Principal | ICD-10-CM | POA: Diagnosis present

## 2022-10-17 DIAGNOSIS — R112 Nausea with vomiting, unspecified: Secondary | ICD-10-CM | POA: Diagnosis present

## 2022-10-17 DIAGNOSIS — Z6823 Body mass index (BMI) 23.0-23.9, adult: Secondary | ICD-10-CM

## 2022-10-17 DIAGNOSIS — R634 Abnormal weight loss: Secondary | ICD-10-CM | POA: Diagnosis present

## 2022-10-17 DIAGNOSIS — K7010 Alcoholic hepatitis without ascites: Secondary | ICD-10-CM | POA: Diagnosis present

## 2022-10-17 LAB — BASIC METABOLIC PANEL
Anion gap: 14 (ref 5–15)
BUN: 5 mg/dL — ABNORMAL LOW (ref 6–20)
CO2: 30 mmol/L (ref 22–32)
Calcium: 8 mg/dL — ABNORMAL LOW (ref 8.9–10.3)
Chloride: 85 mmol/L — ABNORMAL LOW (ref 98–111)
Creatinine, Ser: 0.79 mg/dL (ref 0.61–1.24)
GFR, Estimated: >60 mL/min (ref >60)
Glucose, Bld: 132 mg/dL — ABNORMAL HIGH (ref 70–99)
Potassium: 2.5 mmol/L — CL (ref 3.5–5.1)
Sodium: 129 mmol/L — ABNORMAL LOW (ref 135–145)

## 2022-10-17 LAB — MAGNESIUM
Magnesium: 1.2 mg/dL — ABNORMAL LOW (ref 1.7–2.4)
Magnesium: 1.8 mg/dL (ref 1.7–2.4)

## 2022-10-17 LAB — URINALYSIS, ROUTINE W REFLEX MICROSCOPIC
Bilirubin Urine: NEGATIVE
Glucose, UA: NEGATIVE mg/dL
Hgb urine dipstick: NEGATIVE
Ketones, ur: NEGATIVE mg/dL
Leukocytes,Ua: NEGATIVE
Nitrite: NEGATIVE
Protein, ur: NEGATIVE mg/dL
Specific Gravity, Urine: 1.002 — ABNORMAL LOW (ref 1.005–1.030)
pH: 7 (ref 5.0–8.0)

## 2022-10-17 LAB — SODIUM, URINE, RANDOM: Sodium, Ur: 33 mmol/L

## 2022-10-17 LAB — HEPATIC FUNCTION PANEL
ALT: 199 U/L — ABNORMAL HIGH (ref 0–44)
AST: 611 U/L — ABNORMAL HIGH (ref 15–41)
Albumin: 3.8 g/dL (ref 3.5–5.0)
Alkaline Phosphatase: 65 U/L (ref 38–126)
Bilirubin, Direct: 3 mg/dL — ABNORMAL HIGH (ref 0.0–0.2)
Indirect Bilirubin: 3.4 mg/dL — ABNORMAL HIGH (ref 0.3–0.9)
Total Bilirubin: 6.4 mg/dL — ABNORMAL HIGH (ref 0.3–1.2)
Total Protein: 7.2 g/dL (ref 6.5–8.1)

## 2022-10-17 LAB — CBC
HCT: 36.2 % — ABNORMAL LOW (ref 39.0–52.0)
Hemoglobin: 13.5 g/dL (ref 13.0–17.0)
MCH: 35.5 pg — ABNORMAL HIGH (ref 26.0–34.0)
MCHC: 37.3 g/dL — ABNORMAL HIGH (ref 30.0–36.0)
MCV: 95.3 fL (ref 80.0–100.0)
Platelets: 122 10*3/uL — ABNORMAL LOW (ref 150–400)
RBC: 3.8 MIL/uL — ABNORMAL LOW (ref 4.22–5.81)
RDW: 11 % — ABNORMAL LOW (ref 11.5–15.5)
WBC: 8 10*3/uL (ref 4.0–10.5)
nRBC: 0 % (ref 0.0–0.2)

## 2022-10-17 LAB — OSMOLALITY, URINE: Osmolality, Ur: 130 mOsm/kg — ABNORMAL LOW (ref 300–900)

## 2022-10-17 LAB — PHOSPHORUS: Phosphorus: 1.6 mg/dL — ABNORMAL LOW (ref 2.5–4.6)

## 2022-10-17 LAB — PROTIME-INR
INR: 2 — ABNORMAL HIGH (ref 0.8–1.2)
Prothrombin Time: 22.6 s — ABNORMAL HIGH (ref 11.4–15.2)

## 2022-10-17 LAB — LIPASE, BLOOD: Lipase: 67 U/L — ABNORMAL HIGH (ref 11–51)

## 2022-10-17 LAB — OSMOLALITY: Osmolality: 275 mOsm/kg (ref 275–295)

## 2022-10-17 MED ORDER — POTASSIUM CHLORIDE CRYS ER 20 MEQ PO TBCR
40.0000 meq | EXTENDED_RELEASE_TABLET | Freq: Once | ORAL | Status: AC
  Administered 2022-10-17: 40 meq via ORAL
  Filled 2022-10-17: qty 2

## 2022-10-17 MED ORDER — SENNOSIDES-DOCUSATE SODIUM 8.6-50 MG PO TABS: 1.0000 | ORAL_TABLET | Freq: Every evening | ORAL | Status: DC | PRN

## 2022-10-17 MED ORDER — LACTATED RINGERS IV SOLN: INTRAVENOUS | Status: DC

## 2022-10-17 MED ORDER — ACETAMINOPHEN 325 MG PO TABS: 650.0000 mg | ORAL_TABLET | Freq: Four times a day (QID) | ORAL | Status: DC | PRN

## 2022-10-17 MED ORDER — ONDANSETRON HCL 4 MG/2ML IJ SOLN: 4.0000 mg | Freq: Four times a day (QID) | INTRAMUSCULAR | Status: DC | PRN

## 2022-10-17 MED ORDER — MAGNESIUM SULFATE 2 GM/50ML IV SOLN
2.0000 g | Freq: Once | INTRAVENOUS | Status: AC
  Administered 2022-10-17: 2 g via INTRAVENOUS
  Filled 2022-10-17: qty 50

## 2022-10-17 MED ORDER — LORAZEPAM 2 MG/ML IJ SOLN: 1.0000 mg | INTRAMUSCULAR | Status: DC | PRN

## 2022-10-17 MED ORDER — ONDANSETRON HCL 4 MG/2ML IJ SOLN
4.0000 mg | Freq: Once | INTRAMUSCULAR | Status: AC
  Administered 2022-10-17: 4 mg via INTRAVENOUS
  Filled 2022-10-17: qty 2

## 2022-10-17 MED ORDER — THIAMINE HCL 100 MG/ML IJ SOLN: 100.0000 mg | Freq: Every day | INTRAMUSCULAR | Status: DC

## 2022-10-17 MED ORDER — POTASSIUM CHLORIDE CRYS ER 20 MEQ PO TBCR: 40.0000 meq | EXTENDED_RELEASE_TABLET | Freq: Every day | ORAL | Status: DC

## 2022-10-17 MED ORDER — LORAZEPAM 1 MG PO TABS: 1.0000 mg | ORAL_TABLET | Freq: Two times a day (BID) | ORAL | Status: DC

## 2022-10-17 MED ORDER — LORAZEPAM 1 MG PO TABS
1.0000 mg | ORAL_TABLET | ORAL | Status: DC | PRN
  Administered 2022-10-17: 2 mg via ORAL
  Administered 2022-10-18 (×2): 1 mg via ORAL
  Filled 2022-10-17 (×2): qty 1
  Filled 2022-10-17: qty 2

## 2022-10-17 MED ORDER — LORAZEPAM 2 MG PO TABS
2.0000 mg | ORAL_TABLET | Freq: Four times a day (QID) | ORAL | Status: DC
  Administered 2022-10-17: 2 mg via ORAL
  Filled 2022-10-17: qty 1

## 2022-10-17 MED ORDER — IOHEXOL 300 MG/ML  SOLN
100.0000 mL | Freq: Once | INTRAMUSCULAR | Status: AC | PRN
  Administered 2022-10-17: 100 mL via INTRAVENOUS

## 2022-10-17 MED ORDER — SODIUM CHLORIDE 0.9 % IV BOLUS
1000.0000 mL | Freq: Once | INTRAVENOUS | Status: AC
  Administered 2022-10-17: 1000 mL via INTRAVENOUS

## 2022-10-17 MED ORDER — THIAMINE MONONITRATE 100 MG PO TABS
100.0000 mg | ORAL_TABLET | Freq: Every day | ORAL | Status: DC
  Administered 2022-10-18 – 2022-10-19 (×2): 100 mg via ORAL
  Filled 2022-10-17 (×2): qty 1

## 2022-10-17 MED ORDER — ENOXAPARIN SODIUM 40 MG/0.4ML IJ SOSY
40.0000 mg | PREFILLED_SYRINGE | INTRAMUSCULAR | Status: DC
  Administered 2022-10-17 – 2022-10-18 (×2): 40 mg via SUBCUTANEOUS
  Filled 2022-10-17 (×2): qty 0.4

## 2022-10-17 MED ORDER — ACETAMINOPHEN 650 MG RE SUPP: 650.0000 mg | Freq: Four times a day (QID) | RECTAL | Status: DC | PRN

## 2022-10-17 MED ORDER — ADULT MULTIVITAMIN W/MINERALS CH
1.0000 | ORAL_TABLET | Freq: Every day | ORAL | Status: DC
  Administered 2022-10-17 – 2022-10-19 (×3): 1 via ORAL
  Filled 2022-10-17 (×3): qty 1

## 2022-10-17 MED ORDER — THIAMINE HCL 100 MG/ML IJ SOLN
100.0000 mg | Freq: Every day | INTRAMUSCULAR | Status: DC
  Administered 2022-10-17: 100 mg via INTRAVENOUS
  Filled 2022-10-17: qty 2

## 2022-10-17 MED ORDER — ONDANSETRON HCL 4 MG PO TABS
4.0000 mg | ORAL_TABLET | Freq: Four times a day (QID) | ORAL | Status: DC | PRN
  Administered 2022-10-17: 4 mg via ORAL
  Filled 2022-10-17: qty 1

## 2022-10-17 MED ORDER — FOLIC ACID 1 MG PO TABS
1.0000 mg | ORAL_TABLET | Freq: Every day | ORAL | Status: DC
  Administered 2022-10-17 – 2022-10-19 (×3): 1 mg via ORAL
  Filled 2022-10-17 (×3): qty 1

## 2022-10-17 MED ORDER — POTASSIUM CHLORIDE 10 MEQ/100ML IV SOLN
INTRAVENOUS | Status: AC
  Filled 2022-10-17: qty 100

## 2022-10-17 MED ORDER — LORAZEPAM 2 MG PO TABS: 0.0000 mg | ORAL_TABLET | Freq: Four times a day (QID) | ORAL | Status: DC

## 2022-10-17 MED ORDER — THIAMINE MONONITRATE 100 MG PO TABS: 100.0000 mg | ORAL_TABLET | Freq: Every day | ORAL | Status: DC

## 2022-10-17 MED ORDER — LORAZEPAM 2 MG PO TABS: 0.0000 mg | ORAL_TABLET | Freq: Two times a day (BID) | ORAL | Status: DC

## 2022-10-17 MED ORDER — POTASSIUM CHLORIDE 10 MEQ/100ML IV SOLN
10.0000 meq | INTRAVENOUS | Status: AC
  Administered 2022-10-17 (×4): 10 meq via INTRAVENOUS
  Filled 2022-10-17 (×3): qty 100

## 2022-10-17 MED ORDER — HYDRALAZINE HCL 20 MG/ML IJ SOLN: 5.0000 mg | Freq: Three times a day (TID) | INTRAMUSCULAR | Status: DC | PRN

## 2022-10-17 NOTE — Assessment & Plan Note (Signed)
CIWA precaution initiated Admit to telemetry medical, inpatient

## 2022-10-17 NOTE — Assessment & Plan Note (Addendum)
Presumed secondary to alcohol use Serum osmolality, urine osmolality, urine sodium level ordered

## 2022-10-17 NOTE — ED Provider Notes (Signed)
[facility] Provider Note    Event Date/Time   First MD Initiated Contact with Patient 10/17/22 [phone]     (approximate)   History   Weakness   HPI  Levi Mejia is a 52 y.o. male extensive history of alcohol abuse presents to the ER for evaluation of 5 to 6 days of poor p.o. intake.  Was here for similar symptoms feels like is not getting better.  Drinks alcohol daily last drink of alcohol being 6 days ago.  Denies any fevers denies any hematemesis no hematochezia no melena.     Physical Exam   Triage Vital Signs: ED Triage Vitals  Encounter Vitals Group     BP 10/17/22 0917 105/79     Systolic BP Percentile --      Diastolic BP Percentile --      Pulse Rate 10/17/22 0919 (!) 102     Resp 10/17/22 0917 17     Temp 10/17/22 0917 98 F (36.7 C)     Temp Source 10/17/22 0917 Oral     SpO2 10/17/22 0917 96 %     Weight 10/17/22 0919 156 lb 8.4 oz (71 kg)     Height 10/17/22 0919 5\' 10"  (1.778 m)     Head Circumference --      Peak Flow --      Pain Score 10/17/22 0918 0     Pain Loc --      Pain Education --      Exclude from Growth Chart --     Most recent vital signs: Vitals:   10/17/22 1308 10/17/22 1329  BP:    Pulse: 82 75  Resp: 16 16  Temp:    SpO2: 93% 96%     Constitutional: Alert  Eyes: Conjunctivae are normal.  Head: Atraumatic. Nose: No congestion/rhinnorhea. Mouth/Throat: Mucous membranes are moist.   Neck: Painless ROM.  Cardiovascular:   Good peripheral circulation. Respiratory: Normal respiratory effort.  No retractions.  Gastrointestinal: Soft and nontender.  Musculoskeletal:  no deformity Neurologic:  MAE spontaneously.  Tongue fasciculations, bilateral hand tremor, restless Skin:  Skin is warm, dry and intact. No rash noted. Psychiatric: Mood and affect are normal. Speech and behavior are normal.    ED Results / Procedures / Treatments   Labs (all labs ordered are listed, but only abnormal  results are displayed) Labs Reviewed  BASIC METABOLIC PANEL - Abnormal; Notable for the following components:      Result Value   Sodium 129 (*)    Potassium 2.5 (*)    Chloride 85 (*)    Glucose, Bld 132 (*)    BUN 5 (*)    Calcium 8.0 (*)    All other components within normal limits  CBC - Abnormal; Notable for the following components:   RBC 3.80 (*)    HCT 36.2 (*)    MCH 35.5 (*)    MCHC 37.3 (*)    RDW 11.0 (*)    Platelets 122 (*)    All other components within normal limits  URINALYSIS, ROUTINE W REFLEX MICROSCOPIC - Abnormal; Notable for the following components:   Color, Urine YELLOW (*)    APPearance CLEAR (*)    Specific Gravity, Urine 1.002 (*)    All other components within normal limits  LIPASE, BLOOD - Abnormal; Notable for the following components:   Lipase 67 (*)    All other components within normal limits  HEPATIC FUNCTION PANEL - Abnormal; Notable for  the following components:   AST 611 (*)    ALT 199 (*)    Total Bilirubin 6.4 (*)    Bilirubin, Direct 3.0 (*)    Indirect Bilirubin 3.4 (*)    All other components within normal limits  PROTIME-INR - Abnormal; Notable for the following components:   Prothrombin Time 22.6 (*)    INR 2.0 (*)    All other components within normal limits  MAGNESIUM - Abnormal; Notable for the following components:   Magnesium 1.2 (*)    All other components within normal limits  CBG MONITORING, ED     EKG  ED ECG REPORT I, Willy Eddy, the attending physician, personally viewed and interpreted this ECG.   Date: 10/17/2022  EKG Time: 9:23  Rate: 70  Rhythm: sinus  Axis: normal  Intervals: normal  ST&T Change:  no stemi, no depressions    RADIOLOGY Please see ED Course for my review and interpretation.  I personally reviewed all radiographic images ordered to evaluate for the above acute complaints and reviewed radiology reports and findings.  These findings were personally discussed with the patient.   Please see medical record for radiology report.    PROCEDURES:  Critical Care performed: No  Procedures   MEDICATIONS ORDERED IN ED: Medications  LORazepam (ATIVAN) tablet 2 mg (2 mg Oral Given 10/17/22 1021)    Or  LORazepam (ATIVAN) tablet 0-4 mg ( Oral See Alternative 10/17/22 1021)  LORazepam (ATIVAN) tablet 1 mg (has no administration in time range)    Or  LORazepam (ATIVAN) tablet 0-4 mg (has no administration in time range)  thiamine (VITAMIN B1) tablet 100 mg ( Oral See Alternative 10/17/22 1018)    Or  thiamine (VITAMIN B1) injection 100 mg (100 mg Intravenous Given 10/17/22 1018)  potassium chloride 10 mEq in 100 mL IVPB (10 mEq Intravenous New Bag/Given 10/17/22 1325)  potassium chloride SA (KLOR-CON M) CR tablet 40 mEq (has no administration in time range)  sodium chloride 0.9 % bolus 1,000 mL (0 mLs Intravenous Stopped 10/17/22 1200)  ondansetron (ZOFRAN) injection 4 mg (4 mg Intravenous Given 10/17/22 1017)  iohexol (OMNIPAQUE) 300 MG/ML solution 100 mL (100 mLs Intravenous Contrast Given 10/17/22 1142)  magnesium sulfate IVPB 2 g 50 mL (0 g Intravenous Stopped 10/17/22 1323)     IMPRESSION / MDM / ASSESSMENT AND PLAN / ED COURSE  I reviewed the triage vital signs and the nursing notes.                              Differential diagnosis includes, but is not limited to, pancreatitis, gastritis, colitis, sbo, appendicitis, dka, dehydration, electrolyte abn  Patient presenting to the ER for evaluation of symptoms as described above.  Based on symptoms, risk factors and considered above differential, this presenting complaint could reflect a potentially life-threatening illness therefore the patient will be placed on continuous pulse oximetry and telemetry for monitoring.  Laboratory evaluation will be sent to evaluate for the above complaints.      Clinical Course as of 10/17/22 1439  Mon Oct 17, 2022  1048 Blood does show evidence of worsening liver function tests also  hypokalemic.  Will check mag will replete potassium with IV KCl.  Will order CT imaging. [PR]  1157 CT on my review and interpretation without evidence of obstruction.  Will await formal radiology report. [PR]  1438 CT imaging without evidence of acute obstruction.  Does have severe  steatosis.  INR is also elevated.  Patient is wanting to get sober and is he is presenting with some mild alcohol withdrawal symptoms with hypokalemia hypomagnesemia will consult hospitalist for admission for electrolyte repletion and CIWA protocol for alcohol detox. [PR]    Clinical Course User Index [PR] Willy Eddy, MD     FINAL CLINICAL IMPRESSION(S) / ED DIAGNOSES   Final diagnoses:  Alcohol withdrawal syndrome with complication (HCC)  Dehydration  Hypokalemia  Hypomagnesemia     Rx / DC Orders   ED Discharge Orders     None        Note:  This document was prepared using Dragon voice recognition software and may include unintentional dictation errors.    Willy Eddy, MD 10/17/22 [phone]

## 2022-10-17 NOTE — H&P (Addendum)
History and Physical   Levi Mejia [facility] YQM:[id number] DOB: 06/05/1970 DOA: 10/17/2022  PCP: Levi Allan, MD  Patient coming from: Home  I have personally briefly reviewed patient's old medical records in [facility] EMR.  Chief Concern: Weakness, nausea, vomiting  HPI: Mr. Levi Mejia is a 52 year old male with history of alcohol dependence, elevated LFTs, history of hypokalemia, hypomagnesemia, hyponatremia, liver steatosis who presents the ED for chief concerns of weakness, poor p.o. intake, nausea, vomiting.  Vitals in the ED showed temperature of 98, respiration rate of 19, heart rate of 80, blood pressure 94/75, SpO2 of 94% on room air.  Serum sodium is 129, potassium 2.9, chloride 85, bicarb 30, BUN of 5, serum creatinine of 0.79, EGFR greater than 60, nonfasting blood glucose 132, WBC 8.0, hemoglobin 13.5, platelets of 122.  UA was negative for leukocytes and nitrates.  AST 611, ALT 199, T. bili 6.4.  ED treatment: CIWA precaution was initiated, potassium chloride 10 mEq x 2, magnesium sulfate 2 g IV, sodium chloride 1 L bolus. -------------------------- At bedside, patient was able to tell me her name, age, location, current calendar year.  He reports that over the last 6 months he has had poor p.o. intake from lack of appetite.  He reports that the meal he had in the emergency department at bedside is his first meal in 6 days.  He denies chest pain, shortness of breath, dysuria, hematuria, diarrhea, swelling of his lower extremities, syncope, loss of conscious, blood in his stool.  He reports that he would like to detox as he was having some difficulty refraining from alcohol use at home.  He endorses weakness and nausea and vomiting with chills at night.  He reports the nausea and vomiting has improved significantly.  Reports that over the last unintentionally lost approximately 20 pounds.  Healthcare maintenance: Patient denies ever having a colonoscopy.  He  denies changes to his bowel habits.  He denies pencil thin stool.  Social history: Lives at home with his wife.  He denies tobacco and recreational drug use.  He endorses daily alcohol use, drinking 1/5 of vodka daily, last drink was Friday, 10/14/2022.  Patient endorses infrequent use of chewing marijuana.  ROS: Constitutional: + weight change, no fever ENT/Mouth: no sore throat, no rhinorrhea Eyes: no eye pain, no vision changes Cardiovascular: no chest pain, no dyspnea,  no edema, no palpitations Respiratory: no cough, no sputum, no wheezing Gastrointestinal: + nausea, + vomiting, no diarrhea, no constipation Genitourinary: no urinary incontinence, no dysuria, no hematuria Musculoskeletal: no arthralgias, no myalgias Skin: no skin lesions, no pruritus, Neuro: + weakness, no loss of consciousness, no syncope Psych: no anxiety, no depression, + decrease appetite Heme/Lymph: no bruising, no bleeding  ED Course: Discussed with emergency medicine provider, patient requiring hospitalization for alcohol withdrawal.  Assessment/Plan  Principal Problem:   Alcohol withdrawal with inpatient treatment [facility]) Active Problems:   Alcohol dependence (HCC)   Hypokalemia   Hyponatremia   Hypomagnesemia   Essential hypertension, benign   Abnormal LFTs   Alcoholic hepatitis   Alcohol withdrawal (HCC)   Nausea and vomiting   Weight loss, non-intentional   Hyperlipidemia   History of CVA (cerebrovascular accident)   Assessment and Plan:  * Alcohol withdrawal with inpatient treatment (HCC) CIWA precaution initiated Admit to telemetry medical, inpatient  Hypomagnesemia Presumed secondary to poor p.o. intake Magnesium level was 1.2 in the ED, status post magnesium IV 2 g one-time dose per EDP Time to magnesium recheck on day of  admission at 1700 hrs., will replace if indicated  Hyponatremia Presumed secondary to alcohol use Serum osmolality, urine osmolality, urine sodium level  ordered  Hypokalemia Presumed multifactorial secondary to alcohol use and hypomagnesemia Status post potassium chloride IV infusion, 10 mEq IV administer over 60 minutes, 4 doses ordered by EDP Potassium chloride 40 mill equivalent daily, 2 doses ordered Recheck BMP in a.m.  Essential hypertension, benign Hydralazine 5 mg IV every 8 hours as needed for SBP greater 170, 3 days ordered  Weight loss, non-intentional Counseled patient on close follow-up with outpatient PCP, especially arranging/scheduling outpatient colonoscopy Patient endorses understanding and compliance  Nausea and vomiting LR infusion at rate of 125 mL/h, 1 day ordered  Healthcare maintenance Unexpected weight loss-extensive counseling with patient at bedside that he should follow-up with his PCP to arrange for colonoscopy upon discharge - Patient endorses understanding and compliance  Chart reviewed.   DVT prophylaxis: Enoxaparin 40 mg subcutaneous every 24 hours Code Status: Full code Diet: Heart healthy Family Communication: A phone call was offered, patient declined stating that his wife knows he is being admitted to the hospital for alcohol withdrawal Disposition Plan: Pending clinical course Consults called: None at this time Admission status: Telemetry medical, inpatient  Past Medical History:  Diagnosis Date   Alcohol abuse    Attention deficit disorder without mention of hyperactivity    HTN (hypertension)    Hyperlipidemia 07/03/2022   ICH (intracerebral hemorrhage) (HCC)    Seizures (HCC)    Stroke [facility])    Past Surgical History:  Procedure Laterality Date   ESOPHAGOGASTRODUODENOSCOPY (EGD) WITH PROPOFOL N/A 07/04/2022   Procedure: ESOPHAGOGASTRODUODENOSCOPY (EGD) WITH PROPOFOL;  Surgeon: Levi Reil, MD;  Location: [facility] ENDOSCOPY;  Service: Endoscopy;  Laterality: N/A;   Hand tendon reattachment  02/28/1990   Right hand   toe reattachment  02/28/1994   Right 1st toe   Social  History:  reports that he has never smoked. He has never used smokeless tobacco. He reports current alcohol use. He reports that he does not currently use drugs after having used the following drugs: Marijuana.  Allergies  Allergen Reactions   Lactose Intolerance (Gi)    Family History  Problem Relation Age of Onset   Coronary artery disease Mother 70       Stent   Cancer Mother        Bone   Hypertension Mother    Heart failure Father    Heart attack Father 46   Hypertension Father    Alcohol abuse Father    Hypertension Brother    Diabetes Neg Hx    Prostate cancer Neg Hx    Colon cancer Neg Hx    Heart disease Neg Hx    Family history: Family history reviewed and not pertinent.  Prior to Admission medications   Medication Sig Start Date End Date Taking? Authorizing Provider  aspirin EC 81 MG tablet Take 1 tablet (81 mg total) by mouth daily. 04/08/22   Sunnie Nielsen, DO  diclofenac Sodium (VOLTAREN) 1 % GEL Apply 4 g topically 3 (three) times daily as needed. 08/01/22   Allegra Grana, FNP  folic acid (FOLVITE) 1 MG tablet Take 1 tablet (1 mg total) by mouth daily. 08/18/22   Levi Allan, MD  losartan (COZAAR) 25 MG tablet Take 0.5 tablets (12.5 mg total) by mouth daily. 08/01/22   Allegra Grana, FNP  Multiple Vitamin (MULTIVITAMIN WITH MINERALS) TABS tablet Take 1 tablet by mouth daily. 04/09/22   Lyn Hollingshead,  Natalie, DO  ondansetron (ZOFRAN) 4 MG tablet Take 1 tablet (4 mg total) by mouth every 6 (six) hours as needed for up to 7 days for nausea or vomiting. 10/14/22 10/21/22  Trinna Post, MD  pantoprazole (PROTONIX) 40 MG tablet Take 1 tablet (40 mg total) by mouth 2 (two) times daily before a meal. 10/06/22   Levi Allan, MD  potassium chloride SA (KLOR-CON M) 20 MEQ tablet Take 1 tablet (20 mEq total) by mouth daily for 3 doses. 10/14/22 10/17/22  Trinna Post, MD  thiamine (VITAMIN B1) 100 MG tablet Take 100 mg by mouth daily. Patient not taking: Reported on 08/26/2022 07/04/22    [provider]   Physical Exam: Vitals:   10/17/22 1530 10/17/22 1600 10/17/22 1621 10/17/22 1630  BP: (!) 124/92 (!) 118/90 (!) 118/90 132/89  Pulse: 82 85 87 96  Resp: 16 17  15   Temp:    98.7 F (37.1 C)  TempSrc:    Oral  SpO2: 96% 97%  98%  Weight:      Height:       Constitutional: appears age-appropriate, NAD, calm Eyes: PERRL, lids and conjunctivae normal ENMT: Mucous membranes are moist. Posterior pharynx clear of any exudate or lesions. Age-appropriate dentition. Hearing appropriate Neck: normal, supple, no masses, no thyromegaly Respiratory: clear to auscultation bilaterally, no wheezing, no crackles. Normal respiratory effort. No accessory muscle use.  Cardiovascular: Regular rate and rhythm, no murmurs / rubs / gallops. No extremity edema. 2+ pedal pulses. No carotid bruits.  Abdomen: no tenderness, no masses palpated, no hepatosplenomegaly. Bowel sounds positive.  Musculoskeletal: no clubbing / cyanosis. No joint deformity upper and lower extremities. Good ROM, no contractures, no atrophy. Normal muscle tone.  Skin: no rashes, lesions, ulcers. No induration Neurologic: Sensation intact. Strength 5/5 in all 4.  Psychiatric: Normal judgment and insight. Alert and oriented x 3.  Flat affect.  Depressed mood.  Avoids direct eye contact  EKG: independently reviewed, showing sinus rhythm with rate of 71, QTc 472  Chest x-ray on Admission: I personally reviewed and I agree with radiologist reading as below.  CT ABDOMEN PELVIS W CONTRAST  Result Date: 10/17/2022 CLINICAL DATA:  Acute abdominal pain and vomiting.  Alcohol misuse. EXAM: CT ABDOMEN AND PELVIS WITH CONTRAST TECHNIQUE: Multidetector CT imaging of the abdomen and pelvis was performed using the standard protocol following bolus administration of intravenous contrast. RADIATION DOSE REDUCTION: This exam was performed according to the departmental dose-optimization program which includes automated exposure  control, adjustment of the mA and/or kV according to patient size and/or use of iterative reconstruction technique. CONTRAST:  OMNIPAQUE IOHEXOL 300 MG/ML  SOLN COMPARISON:  06/29/2022 FINDINGS: Lower Chest: No acute findings. Hepatobiliary: No suspicious hepatic masses identified. Severe diffuse hepatic steatosis again seen. Gallbladder is unremarkable. No evidence of biliary ductal dilatation. Pancreas:  No mass or inflammatory changes. Spleen: Within normal limits in size and appearance. Adrenals/Urinary Tract: No suspicious masses identified. No evidence of ureteral calculi or hydronephrosis. Stomach/Bowel: No evidence of obstruction, inflammatory process or abnormal fluid collections. Normal appendix visualized. Mild diverticulosis of descending colon, without No evidence of diverticulitis. Vascular/Lymphatic: No pathologically enlarged lymph nodes. No acute vascular findings. Reproductive:  No mass or other significant abnormality. Other:  None. Musculoskeletal:  No suspicious bone lesions identified. IMPRESSION: No acute findings. Mild left colonic diverticulosis, without radiographic evidence of diverticulitis. Severe hepatic steatosis. Electronically Signed   By: Danae Orleans M.D.   On: 10/17/2022 14:28    Labs on Admission:  I have personally reviewed following labs  CBC: Recent Labs  Lab 10/14/22 1808 10/17/22 0942  WBC 7.4 8.0  HGB 15.9 13.5  HCT 44.1 36.2*  MCV 96.3 95.3  PLT 169 122*   Basic Metabolic Panel: Recent Labs  Lab 10/14/22 1807 10/14/22 1808 10/17/22 0942  NA  --  130* 129*  K  --  2.7* 2.5*  CL  --  83* 85*  CO2  --  29 30  GLUCOSE  --  104* 132*  BUN  --  11 5*  CREATININE  --  0.81 0.79  CALCIUM  --  8.9 8.0*  MG 1.8  --  1.2*   GFR: Estimated Creatinine Clearance: 108.5 mL/min (by C-G formula based on SCr of 0.79 mg/dL).  Liver Function Tests: Recent Labs  Lab 10/14/22 1808 10/17/22 0942  AST 436* 611*  ALT 158* 199*  ALKPHOS 76 65  BILITOT  3.3* 6.4*  PROT 8.6* 7.2  ALBUMIN 4.5 3.8   Recent Labs  Lab 10/14/22 1808 10/17/22 0942  LIPASE 88* 67*   Coagulation Profile: Recent Labs  Lab 10/17/22 1202  INR 2.0*   Urine analysis:    Component Value Date/Time   COLORURINE YELLOW (A) 10/17/2022 0942   APPEARANCEUR CLEAR (A) 10/17/2022 0942   APPEARANCEUR Clear 03/25/2014 1100   LABSPEC 1.002 (L) 10/17/2022 0942   LABSPEC >1.060 03/25/2014 1100   PHURINE 7.0 10/17/2022 0942   GLUCOSEU NEGATIVE 10/17/2022 0942   GLUCOSEU Negative 03/25/2014 1100   HGBUR NEGATIVE 10/17/2022 0942   HGBUR negative 07/17/2008 0956   BILIRUBINUR NEGATIVE 10/17/2022 0942   BILIRUBINUR Negative 03/25/2014 1100   KETONESUR NEGATIVE 10/17/2022 0942   PROTEINUR NEGATIVE 10/17/2022 0942   UROBILINOGEN 0.2 07/17/2008 0956   NITRITE NEGATIVE 10/17/2022 0942   LEUKOCYTESUR NEGATIVE 10/17/2022 0942   LEUKOCYTESUR Negative 03/25/2014 1100   This document was prepared using Dragon Voice Recognition software and may include unintentional dictation errors.  Dr. Sedalia Muta [facility]  If 7PM-7AM, please contact overnight-coverage provider If 7AM-7PM, please contact day attending provider [email or web address]  10/17/2022, 5:08 PM

## 2022-10-17 NOTE — Hospital Course (Signed)
Mr. Levi Mejia is a 52 year old male with history of alcohol dependence, elevated LFTs, history of hypokalemia, hypomagnesemia, hyponatremia, liver steatosis who presents the ED for chief concerns of weakness, poor p.o. intake, nausea, vomiting.  Vitals in the ED showed temperature of 98, respiration rate of 19, heart rate of 80, blood pressure 94/75, SpO2 of 94% on room air.  Serum sodium is 129, potassium 2.9, chloride 85, bicarb 30, BUN of 5, serum creatinine of 0.79, EGFR greater than 60, nonfasting blood glucose 132, WBC 8.0, hemoglobin 13.5, platelets of 122.  UA was negative for leukocytes and nitrates.  AST 611, ALT 199, T. bili 6.4.  ED treatment: CIWA precaution was initiated, potassium chloride 10 mEq x 2, magnesium sulfate 2 g IV, sodium chloride 1 L bolus.

## 2022-10-17 NOTE — Assessment & Plan Note (Addendum)
Presumed multifactorial secondary to alcohol use and hypomagnesemia Status post potassium chloride IV infusion, 10 mEq IV administer over 60 minutes, 4 doses ordered by EDP Potassium chloride 40 mill equivalent daily, 2 doses ordered Recheck BMP in a.m.

## 2022-10-17 NOTE — Assessment & Plan Note (Addendum)
Presumed secondary to poor p.o. intake Magnesium level was 1.2 in the ED, status post magnesium IV 2 g one-time dose per EDP Time to magnesium recheck on day of admission at 1700 hrs., will replace if indicated

## 2022-10-17 NOTE — Assessment & Plan Note (Signed)
Hydralazine 5 mg IV every 8 hours as needed for SBP greater 170, 3 days ordered

## 2022-10-17 NOTE — Assessment & Plan Note (Signed)
Counseled patient on close follow-up with outpatient PCP, especially arranging/scheduling outpatient colonoscopy Patient endorses understanding and compliance

## 2022-10-17 NOTE — ED Notes (Signed)
Patient transported to CT 

## 2022-10-17 NOTE — ED Triage Notes (Signed)
Pt presents to ED with c/o of weakness and emesis. Pt states not able to drink. Pt in no distress. Pt states last drink was Friday.

## 2022-10-17 NOTE — ED Notes (Signed)
Patient reports his last alcoholic drink was Friday 10/14/22

## 2022-10-17 NOTE — Assessment & Plan Note (Signed)
LR infusion at rate of 125 mL/h, 1 day ordered

## 2022-10-18 DIAGNOSIS — F10930 Alcohol use, unspecified with withdrawal, uncomplicated: Secondary | ICD-10-CM | POA: Diagnosis not present

## 2022-10-18 LAB — BASIC METABOLIC PANEL
Anion gap: 7 (ref 5–15)
BUN: <5 mg/dL — ABNORMAL LOW (ref 6–20)
CO2: 29 mmol/L (ref 22–32)
Calcium: 7.9 mg/dL — ABNORMAL LOW (ref 8.9–10.3)
Chloride: 99 mmol/L (ref 98–111)
Creatinine, Ser: 0.72 mg/dL (ref 0.61–1.24)
GFR, Estimated: >60 mL/min (ref >60)
Glucose, Bld: 98 mg/dL (ref 70–99)
Potassium: 3.1 mmol/L — ABNORMAL LOW (ref 3.5–5.1)
Sodium: 135 mmol/L (ref 135–145)

## 2022-10-18 LAB — CBC
HCT: 35.5 % — ABNORMAL LOW (ref 39.0–52.0)
Hemoglobin: 12.8 g/dL — ABNORMAL LOW (ref 13.0–17.0)
MCH: 36.1 pg — ABNORMAL HIGH (ref 26.0–34.0)
MCHC: 36.1 g/dL — ABNORMAL HIGH (ref 30.0–36.0)
MCV: 100 fL (ref 80.0–100.0)
Platelets: 87 10*3/uL — ABNORMAL LOW (ref 150–400)
RBC: 3.55 MIL/uL — ABNORMAL LOW (ref 4.22–5.81)
RDW: 10.9 % — ABNORMAL LOW (ref 11.5–15.5)
WBC: 6.1 10*3/uL (ref 4.0–10.5)
nRBC: 0.3 % — ABNORMAL HIGH (ref 0.0–0.2)

## 2022-10-18 MED ORDER — POTASSIUM CHLORIDE CRYS ER 20 MEQ PO TBCR
40.0000 meq | EXTENDED_RELEASE_TABLET | Freq: Once | ORAL | Status: AC
  Administered 2022-10-18: 40 meq via ORAL
  Filled 2022-10-18: qty 2

## 2022-10-18 MED ORDER — PREDNISOLONE 5 MG PO TABS
40.0000 mg | ORAL_TABLET | Freq: Every day | ORAL | Status: DC
  Filled 2022-10-18 (×2): qty 8

## 2022-10-18 MED ORDER — ENSURE ENLIVE PO LIQD
237.0000 mL | Freq: Two times a day (BID) | ORAL | Status: DC
  Administered 2022-10-18 (×2): 237 mL via ORAL

## 2022-10-18 MED ORDER — LOSARTAN POTASSIUM 25 MG PO TABS
12.5000 mg | ORAL_TABLET | Freq: Every day | ORAL | Status: DC
  Administered 2022-10-18 – 2022-10-19 (×2): 12.5 mg via ORAL
  Filled 2022-10-18 (×2): qty 1

## 2022-10-18 MED ORDER — PREDNISOLONE 5 MG PO TABS: 40.0000 mg | ORAL_TABLET | Freq: Every day | ORAL | Status: DC

## 2022-10-18 MED ORDER — POTASSIUM CHLORIDE IN NACL 20-0.9 MEQ/L-% IV SOLN
INTRAVENOUS | Status: DC
  Filled 2022-10-18 (×6): qty 1000

## 2022-10-18 MED ORDER — ENSURE ENLIVE PO LIQD
237.0000 mL | Freq: Three times a day (TID) | ORAL | Status: DC
  Administered 2022-10-19: 237 mL via ORAL

## 2022-10-18 MED ORDER — PREDNISOLONE SODIUM PHOSPHATE 15 MG/5ML PO SOLN
40.0000 mg | Freq: Once | ORAL | Status: AC
  Administered 2022-10-18: 40 mg via ORAL
  Filled 2022-10-18: qty 13.3

## 2022-10-18 MED ORDER — PANTOPRAZOLE SODIUM 40 MG IV SOLR
40.0000 mg | INTRAVENOUS | Status: DC
  Administered 2022-10-18: 40 mg via INTRAVENOUS
  Filled 2022-10-18 (×2): qty 10

## 2022-10-18 MED ORDER — K PHOS MONO-SOD PHOS DI & MONO 155-852-130 MG PO TABS
500.0000 mg | ORAL_TABLET | ORAL | Status: AC
  Administered 2022-10-18: 500 mg via ORAL
  Filled 2022-10-18 (×2): qty 2

## 2022-10-18 NOTE — Progress Notes (Signed)
Initial Nutrition Assessment  DOCUMENTATION CODES:   Non-severe (moderate) malnutrition in context of chronic illness  INTERVENTION:   Ensure Enlive po TID, each supplement provides 350 kcal and 20 grams of protein.  MVI, folic acid and thiamine po daily   Liberalize diet   Pt at high refeed risk; recommend monitor potassium, magnesium and phosphorus labs daily until stable  Daily weights  NUTRITION DIAGNOSIS:   Moderate Malnutrition related to social / environmental circumstances as evidenced by moderate fat depletion, moderate muscle depletion.  GOAL:   Patient will meet greater than or equal to 90% of their needs  MONITOR:   PO intake, Supplement acceptance, Labs, Weight trends, I & O's, Skin  REASON FOR ASSESSMENT:   Malnutrition Screening Tool    ASSESSMENT:   52 y/o male with h/o etoh abuse, HLD, HTN and CVA who is admitted with alcohol withdrawal with inpatient treatment.  Met with pt in room today. Pt reports poor appetite and oral intake for 5-7 days pta. Pt reports that his oral intake is improved in hospital; pt ate 25% of his lunch today. Pt reports that he uses Ghost protein powder at home. RD discussed with pt the importance of adequate nutrition needed to preserve lean muscle. RD will add supplements to help pt meet his estimated needs (prefers strawberry). RD will also liberalize pt's diet. Pt is at high refeed risk. Per chart, pt appears fairly weight stable at baseline.   Medications reviewed and include: lovenox, folic acid, MVI, protonix, Kphos, prednisolone, thiamine, NaCl w/ Kcl @125ml /hr   Labs reviewed: K 3.1(L), BUN <5(L) P 1.6(L), Mg 1.8 wnl- 8/19  NUTRITION - FOCUSED PHYSICAL EXAM:  Flowsheet Row Most Recent Value  Orbital Region No depletion  Upper Arm Region Moderate depletion  Thoracic and Lumbar Region Mild depletion  Buccal Region No depletion  Temple Region Mild depletion  Clavicle Bone Region Moderate depletion  Clavicle and  Acromion Bone Region Moderate depletion  Scapular Bone Region No depletion  Dorsal Hand Mild depletion  Patellar Region Severe depletion  Anterior Thigh Region Severe depletion  Posterior Calf Region Severe depletion  Edema (RD Assessment) None  Hair Reviewed  Eyes Reviewed  Mouth Reviewed  Skin Reviewed  Nails Reviewed   Diet Order:   Diet Order             Diet Heart Room service appropriate? Yes; Fluid consistency: Thin  Diet effective now                  EDUCATION NEEDS:   Education needs have been addressed  Skin:  Skin Assessment: Reviewed RN Assessment  Last BM:  8/19  Height:   Ht Readings from Last 1 Encounters:  10/17/22 5\' 10"  (1.778 m)    Weight:   Wt Readings from Last 1 Encounters:  10/18/22 75.9 kg    Ideal Body Weight:  75.45 kg  BMI:  Body mass index is 24.02 kg/m.  Estimated Nutritional Needs:   Kcal:  2100-2400kcal/day  Protein:  105-120g/day  Fluid:  2.3-2.6L/day  Betsey Holiday MS, RD, LDN Please refer to [facility] for RD and/or RD on-call/weekend/after hours pager

## 2022-10-18 NOTE — TOC Initial Note (Signed)
Transition of Care [facility]) - Initial/Assessment Note    Patient Details  Name: Levi Mejia MRN: [id number] Date of Birth: [birth year]  Transition of [facility]) CM/SW Contact:    Truddie Hidden, RN Phone Number: 10/18/2022, 3:03 PM  Clinical Narrative:                 Admitted WUJ:[id number], nausea and vomiting Admitted from:Home with spouse and daughter  Pharmacy:[facility]. No issues obtaining medications Current home health/prior home health/DME: No DME Tansportation: Drives, wife will transport home   Expected Discharge Plan: Home/Self Care Barriers to Discharge: Continued Medical Work up   Patient Goals and CMS Choice Patient states their goals for this hospitalization and ongoing recovery are:: Home          Expected Discharge Plan and Services       Living arrangements for the past 2 months: Single Family Home                                      Prior Living Arrangements/Services Living arrangements for the past 2 months: Single Family Home Lives with:: Spouse, Adult Children Patient language and need for interpreter reviewed:: Yes Do you feel safe going back to the place where you live?: Yes      Need for Family Participation in Patient Care: Yes (Comment) Care giver support system in place?: Yes (comment)   Criminal Activity/Legal Involvement Pertinent to Current Situation/Hospitalization: No - Comment as needed  Activities of Daily Living Home Assistive Devices/Equipment: None ADL Screening (condition at time of admission) Patient's cognitive ability adequate to safely complete daily activities?: Yes Is the patient deaf or have difficulty hearing?: No Does the patient have difficulty seeing, even when wearing glasses/contacts?: Yes Does the patient have difficulty concentrating, remembering, or making decisions?: No Patient able to express need for assistance with ADLs?: Yes Does the patient have difficulty dressing or  bathing?: No Independently performs ADLs?: Yes (appropriate for developmental age) Does the patient have difficulty walking or climbing stairs?: No Weakness of Legs: None Weakness of Arms/Hands: None  Permission Sought/Granted                  Emotional Assessment Appearance:: Appears older than stated age Attitude/Demeanor/Rapport: Engaged, Gracious Affect (typically observed): Accepting Orientation: : Oriented to Self, Oriented to Place, Oriented to  Time, Oriented to Situation Alcohol / Substance Use: Not Applicable Psych Involvement: No (comment)  Admission diagnosis:  Dehydration [E86.0] Hypokalemia [E87.6] Hypomagnesemia [E83.42] Alcohol withdrawal with inpatient treatment (HCC) [F10.939] Alcohol withdrawal syndrome with complication [facility]) [F10.939] Patient Active Problem List   Diagnosis Date Noted   Alcohol withdrawal with inpatient treatment (HCC) 10/17/2022   Scleral icterus 10/14/2022   Fatigue 10/14/2022   H/O alcohol dependence (HCC) 10/14/2022   Arthralgia of left hand 08/14/2022   Left hand pain 08/01/2022   Hypophosphatemia 07/30/2022   Weight loss, non-intentional 07/03/2022   Hyperlipidemia 07/03/2022   Screening PSA (prostate specific antigen) 07/03/2022   Colon cancer screening 07/03/2022   History of CVA (cerebrovascular accident) 07/03/2022   Nausea and vomiting 07/02/2022   Esophageal dysphagia 07/02/2022   Protein-calorie malnutrition, severe 07/01/2022   Alcohol withdrawal (HCC) 06/30/2022   Alcoholic hepatitis 06/29/2022   AKI (acute kidney injury) (HCC) 06/29/2022   Lactic acidosis 06/29/2022   Dehydration 06/29/2022   Stroke (HCC) 04/07/2022   Fall at home, initial encounter 04/07/2022   Hypokalemia 04/07/2022  Hyponatremia 04/07/2022   Abnormal LFTs 04/07/2022   HTN (hypertension) 04/07/2022   Hypomagnesemia 04/07/2022   Seizure (HCC) 05/01/2020   Sensory loss 02/05/2019   Alcohol dependence (HCC) 03/02/2018   Cerebrovascular  disease 03/02/2018   Syncope, vasovagal 02/02/2018   Preventative health care 09/30/2016   Sleep disorder 09/30/2016   Essential hypertension, benign 08/23/2011   PCP:  Dana Allan, MD Pharmacy:   [facility] - [place], [place] - [address] [place] [address] Phone: [phone] Fax: [phone]  [id number] - [place], [place] - [address] [place] [address] Phone: [phone] Fax: [phone]     Social Determinants of Health (SDOH) Social History: SDOH Screenings   Food Insecurity: No Food Insecurity (10/17/2022)  Housing: Low Risk  (10/17/2022)  Transportation Needs: No Transportation Needs (10/17/2022)  Utilities: Not At Risk (10/17/2022)  Alcohol Screen: Medium Risk (08/08/2022)  Depression (PHQ2-9): Low Risk  (10/14/2022)  Financial Resource Strain: Low Risk  (08/08/2022)  Physical Activity: Sufficiently Active (08/08/2022)  Recent Concern: Physical Activity - Inactive (07/14/2022)  Social Connections: Moderately Isolated (08/08/2022)  Stress: No Stress Concern Present (08/08/2022)  Tobacco Use: Low Risk  (10/17/2022)   SDOH Interventions:     Readmission Risk Interventions    10/18/2022    3:02 PM  Readmission Risk Prevention Plan  Transportation Screening Complete  PCP or Specialist Appt within 3-5 Days Complete  HRI or Home Care Consult Complete  Social Work Consult for Recovery Care Planning/Counseling Complete  Palliative Care Screening Not Complete  Medication Review [employer]) Complete

## 2022-10-18 NOTE — Plan of Care (Signed)
Faythe Dingwall [place]

## 2022-10-18 NOTE — Progress Notes (Signed)
PROGRESS NOTE    Levi Mejia [facility]  XBJ:[id number] DOB: February 03, 1971 DOA: 10/17/2022 PCP: Dana Allan, MD  Outpatient Specialists: GI    Brief Narrative:   Mr. Levi Mejia is a 52 year old male with history of alcohol dependence, elevated LFTs, history of hypokalemia, hypomagnesemia, hyponatremia, liver steatosis who presents the ED for chief concerns of weakness, poor p.o. intake, nausea, vomiting.   At bedside, patient was able to tell me her name, age, location, current calendar year.   He reports that over the last 6 months he has had poor p.o. intake from lack of appetite.  He reports that the meal he had in the emergency department at bedside is his first meal in 6 days.   He denies chest pain, shortness of breath, dysuria, hematuria, diarrhea, swelling of his lower extremities, syncope, loss of conscious, blood in his stool.   He reports that he would like to detox as he was having some difficulty refraining from alcohol use at home.  He endorses weakness and nausea and vomiting with chills at night.   He reports the nausea and vomiting has improved significantly.   Reports that over the last unintentionally lost approximately 20 pounds.   Healthcare maintenance: Patient denies ever having a colonoscopy.  He denies changes to his bowel habits.  He denies pencil thin stool.      Assessment & Plan:   Principal Problem:   Alcohol withdrawal with inpatient treatment [facility]) Active Problems:   Alcohol dependence (HCC)   Hypokalemia   Hyponatremia   Hypomagnesemia   Essential hypertension, benign   Abnormal LFTs   Alcoholic hepatitis   Alcohol withdrawal (HCC)   Nausea and vomiting   Weight loss, non-intentional   Hyperlipidemia   History of CVA (cerebrovascular accident)   # Alcoholism # Alcohol withdrawal # History alcohol withdrawal seizure Last drink PM 8/16. Does have history of complicated withdrawal (seizure) few years ago. Current symptoms are  mild - continue CIWA, vitamins, fluids - TOC consulted - seizure precautions  # Alcohol hepatitis INR 2, tbili 6.4, AST 611, ALT 199. Maddrey discriminant function score is at least 40 suggesting poor prognosis. Follows with GI ([facility]) as outpt. - start prednisolone 40 - PPI every day  # Nausea/vomiting 2/2 alcohol withdrawal. Improving, tolerated breakfast, nothing acute on CT. Denies hematemesis - maintain IVF for now - nausea meds  # Hypokalemia # Hypokalemia - replete and monitor  # HTN Here bp mild/moderately elevated - resume home losartan  # Hx CVA On ASA at home. Not on statin 2/2 liver disease      DVT prophylaxis: lovenox Code Status: full Family Communication: none @ bedside  Level of care: Telemetry Medical Status is: Inpatient Remains inpatient appropriate because: need for IVF and inpatient monitoring    Consultants:  none  Procedures: none  Antimicrobials:  none    Subjective: Nausea improving, tolerated breakfast  Objective: Vitals:   10/17/22 2030 10/17/22 2100 10/17/22 2244 10/18/22 0730  BP: (!) 128/93 118/88 (!) 143/93 (!) 158/99  Pulse: 80  82 78  Resp: 16  18 16   Temp: 99.6 F (37.6 C)  98 F (36.7 C) 98.8 F (37.1 C)  TempSrc: Oral  Oral Oral  SpO2: 96%  100% 100%  Weight:      Height:       No intake or output data in the 24 hours ending 10/18/22 1011 Filed Weights   10/17/22 0919  Weight: 71 kg    Examination:  General exam: Appears calm  and comfortable  Respiratory system: Clear to auscultation. Respiratory effort normal. Cardiovascular system: S1 & S2 heard, RRR. No JVD, murmurs, rubs, gallops or clicks. No pedal edema. Gastrointestinal system: Abdomen is nondistended, soft and nontender. No organomegaly or masses felt. Normal bowel sounds heard. Central nervous system: Alert and oriented. No focal neurological deficits. Extremities: Symmetric 5 x 5 power. Skin: No rashes, lesions or ulcers Psychiatry:  Judgement and insight appear normal. Mood & affect appropriate.     Data Reviewed: I have personally reviewed following labs and imaging studies  CBC: Recent Labs  Lab 10/14/22 1808 10/17/22 0942 10/18/22 0632  WBC 7.4 8.0 6.1  HGB 15.9 13.5 12.8*  HCT 44.1 36.2* 35.5*  MCV 96.3 95.3 100.0  PLT 169 122* 87*   Basic Metabolic Panel: Recent Labs  Lab 10/14/22 1807 10/14/22 1808 10/17/22 0942 10/17/22 1700 10/17/22 1740 10/18/22 0632  NA  --  130* 129*  --   --  135  K  --  2.7* 2.5*  --   --  3.1*  CL  --  83* 85*  --   --  99  CO2  --  29 30  --   --  29  GLUCOSE  --  104* 132*  --   --  98  BUN  --  11 5*  --   --  <5*  CREATININE  --  0.81 0.79  --   --  0.72  CALCIUM  --  8.9 8.0*  --   --  7.9*  MG 1.8  --  1.2* 1.8  --   --   PHOS  --   --   --   --  1.6*  --    GFR: Estimated Creatinine Clearance: 108.5 mL/min (by C-G formula based on SCr of 0.72 mg/dL). Liver Function Tests: Recent Labs  Lab 10/14/22 1808 10/17/22 0942  AST 436* 611*  ALT 158* 199*  ALKPHOS 76 65  BILITOT 3.3* 6.4*  PROT 8.6* 7.2  ALBUMIN 4.5 3.8   Recent Labs  Lab 10/14/22 1808 10/17/22 0942  LIPASE 88* 67*   No results for input(s): "AMMONIA" in the last 168 hours. Coagulation Profile: Recent Labs  Lab 10/17/22 1202  INR 2.0*   Cardiac Enzymes: No results for input(s): "CKTOTAL", "CKMB", "CKMBINDEX", "TROPONINI" in the last 168 hours. BNP (last 3 results) No results for input(s): "PROBNP" in the last 8760 hours. HbA1C: No results for input(s): "HGBA1C" in the last 72 hours. CBG: No results for input(s): "GLUCAP" in the last 168 hours. Lipid Profile: No results for input(s): "CHOL", "HDL", "LDLCALC", "TRIG", "CHOLHDL", "LDLDIRECT" in the last 72 hours. Thyroid Function Tests: No results for input(s): "TSH", "T4TOTAL", "FREET4", "T3FREE", "THYROIDAB" in the last 72 hours. Anemia Panel: No results for input(s): "VITAMINB12", "FOLATE", "FERRITIN", "TIBC", "IRON",  "RETICCTPCT" in the last 72 hours. Urine analysis:    Component Value Date/Time   COLORURINE YELLOW (A) 10/17/2022 0942   APPEARANCEUR CLEAR (A) 10/17/2022 0942   APPEARANCEUR Clear 03/25/2014 1100   LABSPEC 1.002 (L) 10/17/2022 0942   LABSPEC >1.060 03/25/2014 1100   PHURINE 7.0 10/17/2022 0942   GLUCOSEU NEGATIVE 10/17/2022 0942   GLUCOSEU Negative 03/25/2014 1100   HGBUR NEGATIVE 10/17/2022 0942   HGBUR negative 07/17/2008 0956   BILIRUBINUR NEGATIVE 10/17/2022 0942   BILIRUBINUR Negative 03/25/2014 1100   KETONESUR NEGATIVE 10/17/2022 0942   PROTEINUR NEGATIVE 10/17/2022 0942   UROBILINOGEN 0.2 07/17/2008 0956   NITRITE NEGATIVE 10/17/2022 0942   LEUKOCYTESUR NEGATIVE  10/17/2022 0942   LEUKOCYTESUR Negative 03/25/2014 1100   Sepsis Labs: @LABRCNTIP (procalcitonin:4,lacticidven:4)  )No results found for this or any previous visit (from the past 240 hour(s)).       Radiology Studies: CT ABDOMEN PELVIS W CONTRAST  Result Date: 10/17/2022 CLINICAL DATA:  Acute abdominal pain and vomiting.  Alcohol misuse. EXAM: CT ABDOMEN AND PELVIS WITH CONTRAST TECHNIQUE: Multidetector CT imaging of the abdomen and pelvis was performed using the standard protocol following bolus administration of intravenous contrast. RADIATION DOSE REDUCTION: This exam was performed according to the departmental dose-optimization program which includes automated exposure control, adjustment of the mA and/or kV according to patient size and/or use of iterative reconstruction technique. CONTRAST:  OMNIPAQUE IOHEXOL 300 MG/ML  SOLN COMPARISON:  06/29/2022 FINDINGS: Lower Chest: No acute findings. Hepatobiliary: No suspicious hepatic masses identified. Severe diffuse hepatic steatosis again seen. Gallbladder is unremarkable. No evidence of biliary ductal dilatation. Pancreas:  No mass or inflammatory changes. Spleen: Within normal limits in size and appearance. Adrenals/Urinary Tract: No suspicious masses  identified. No evidence of ureteral calculi or hydronephrosis. Stomach/Bowel: No evidence of obstruction, inflammatory process or abnormal fluid collections. Normal appendix visualized. Mild diverticulosis of descending colon, without No evidence of diverticulitis. Vascular/Lymphatic: No pathologically enlarged lymph nodes. No acute vascular findings. Reproductive:  No mass or other significant abnormality. Other:  None. Musculoskeletal:  No suspicious bone lesions identified. IMPRESSION: No acute findings. Mild left colonic diverticulosis, without radiographic evidence of diverticulitis. Severe hepatic steatosis. Electronically Signed   By: Danae Orleans M.D.   On: 10/17/2022 14:28        Scheduled Meds:  enoxaparin (LOVENOX) injection  40 mg Subcutaneous Q24H   feeding supplement  237 mL Oral BID BM   folic acid  1 mg Oral Daily   multivitamin with minerals  1 tablet Oral Daily   thiamine  100 mg Oral Daily   Or   thiamine  100 mg Intravenous Daily   Continuous Infusions:  0.9 % NaCl with KCl 20 mEq / L       LOS: 1 day     Silvano Bilis, MD [facility]   If 7PM-7AM, please contact night-coverage [email or web address] Password [email or web address] 10/18/2022, 10:11 AM

## 2022-10-19 DIAGNOSIS — F10930 Alcohol use, unspecified with withdrawal, uncomplicated: Secondary | ICD-10-CM | POA: Diagnosis not present

## 2022-10-19 DIAGNOSIS — K7010 Alcoholic hepatitis without ascites: Secondary | ICD-10-CM | POA: Diagnosis not present

## 2022-10-19 DIAGNOSIS — E440 Moderate protein-calorie malnutrition: Secondary | ICD-10-CM | POA: Diagnosis present

## 2022-10-19 LAB — MAGNESIUM: Magnesium: 1.5 mg/dL — ABNORMAL LOW (ref 1.7–2.4)

## 2022-10-19 LAB — COMPREHENSIVE METABOLIC PANEL
ALT: 144 U/L — ABNORMAL HIGH (ref 0–44)
AST: 246 U/L — ABNORMAL HIGH (ref 15–41)
Albumin: 3.2 g/dL — ABNORMAL LOW (ref 3.5–5.0)
Alkaline Phosphatase: 61 U/L (ref 38–126)
Anion gap: 8 (ref 5–15)
BUN: 6 mg/dL (ref 6–20)
CO2: 26 mmol/L (ref 22–32)
Calcium: 8.2 mg/dL — ABNORMAL LOW (ref 8.9–10.3)
Chloride: 101 mmol/L (ref 98–111)
Creatinine, Ser: 0.78 mg/dL (ref 0.61–1.24)
GFR, Estimated: >60 mL/min (ref >60)
Glucose, Bld: 99 mg/dL (ref 70–99)
Potassium: 3.5 mmol/L (ref 3.5–5.1)
Sodium: 135 mmol/L (ref 135–145)
Total Bilirubin: 2.8 mg/dL — ABNORMAL HIGH (ref 0.3–1.2)
Total Protein: 6.2 g/dL — ABNORMAL LOW (ref 6.5–8.1)

## 2022-10-19 LAB — CBC
HCT: 35 % — ABNORMAL LOW (ref 39.0–52.0)
Hemoglobin: 12.2 g/dL — ABNORMAL LOW (ref 13.0–17.0)
MCH: 35.4 pg — ABNORMAL HIGH (ref 26.0–34.0)
MCHC: 34.9 g/dL (ref 30.0–36.0)
MCV: 101.4 fL — ABNORMAL HIGH (ref 80.0–100.0)
Platelets: 102 10*3/uL — ABNORMAL LOW (ref 150–400)
RBC: 3.45 MIL/uL — ABNORMAL LOW (ref 4.22–5.81)
RDW: 11.1 % — ABNORMAL LOW (ref 11.5–15.5)
WBC: 6.3 10*3/uL (ref 4.0–10.5)
nRBC: 0 % (ref 0.0–0.2)

## 2022-10-19 LAB — PHOSPHORUS: Phosphorus: 1.5 mg/dL — ABNORMAL LOW (ref 2.5–4.6)

## 2022-10-19 MED ORDER — K PHOS MONO-SOD PHOS DI & MONO 155-852-130 MG PO TABS: 500.0000 mg | ORAL_TABLET | Freq: Three times a day (TID) | ORAL | 0 refills | Status: AC

## 2022-10-19 MED ORDER — ENSURE ENLIVE PO LIQD: 237.0000 mL | Freq: Three times a day (TID) | ORAL | Status: DC

## 2022-10-19 MED ORDER — MAGNESIUM SULFATE 2 GM/50ML IV SOLN
2.0000 g | Freq: Once | INTRAVENOUS | Status: AC
  Administered 2022-10-19: 2 g via INTRAVENOUS
  Filled 2022-10-19: qty 50

## 2022-10-19 MED ORDER — PREDNISOLONE SODIUM PHOSPHATE 15 MG/5ML PO SOLN
40.0000 mg | Freq: Every day | ORAL | Status: DC
  Filled 2022-10-19: qty 13.3

## 2022-10-19 MED ORDER — POTASSIUM CHLORIDE CRYS ER 20 MEQ PO TBCR: 20.0000 meq | EXTENDED_RELEASE_TABLET | Freq: Every day | ORAL | 0 refills | Status: DC

## 2022-10-19 MED ORDER — PREDNISOLONE 5 MG PO TABS: 10.0000 mg | ORAL_TABLET | Freq: Every day | ORAL | 0 refills | Status: AC

## 2022-10-19 MED ORDER — K PHOS MONO-SOD PHOS DI & MONO 155-852-130 MG PO TABS
500.0000 mg | ORAL_TABLET | Freq: Four times a day (QID) | ORAL | Status: DC
  Administered 2022-10-19: 500 mg via ORAL
  Filled 2022-10-19 (×3): qty 2

## 2022-10-19 MED ORDER — POTASSIUM CHLORIDE CRYS ER 20 MEQ PO TBCR
40.0000 meq | EXTENDED_RELEASE_TABLET | Freq: Once | ORAL | Status: AC
  Administered 2022-10-19: 40 meq via ORAL
  Filled 2022-10-19: qty 2

## 2022-10-19 MED ORDER — PREDNISOLONE 5 MG PO TABS
40.0000 mg | ORAL_TABLET | Freq: Every day | ORAL | Status: DC
  Administered 2022-10-19: 40 mg via ORAL
  Filled 2022-10-19: qty 8

## 2022-10-19 NOTE — Consult Note (Signed)
PHARMACY CONSULT NOTE - ELECTROLYTES  Pharmacy Consult for Electrolyte Monitoring and Replacement   Recent Labs: Height: 5\' 10"  (177.8 cm) Weight: 73.2 kg (161 lb 6 oz) IBW/kg (Calculated) : 73 Estimated Creatinine Clearance: 111.5 mL/min (by C-G formula based on SCr of 0.78 mg/dL).  Potassium (mmol/L)  Date Value  10/19/2022 3.5  03/25/2014 3.2 (L)   Magnesium (mg/dL)  Date Value  16/11/9602 1.5 (L)   Calcium (mg/dL)  Date Value  54/10/8117 8.2 (L)   Calcium, Total (mg/dL)  Date Value  14/78/2956 9.4   Albumin (g/dL)  Date Value  21/30/8657 3.2 (L)  03/25/2014 4.6   Phosphorus (mg/dL)  Date Value  84/69/6295 1.5 (L)   Sodium (mmol/L)  Date Value  10/19/2022 135  03/25/2014 133 (L)   Corrected Ca: 8.84 mg/dL  Assessment  Levi Mejia is a 52 y.o. male presenting with 5 day history of poor oral intake with associated nausea and vomiting. PMH is significant for alcohol dependence, elevated LFTs, history of hypokalemia, hypomagnesemia, hyponatremia, liver steatosis, and HTN. Patient reports desire to become sober and was admitted for alcohol detoxification. Pharmacy has been consulted to monitor and replace electrolytes.  Diet: Thin fluids MIVF: 0.9% NaCl with 20 mEq/L @ 125 mL/hr   Goal of Therapy: Electrolytes WNL  Plan:  Magnesium sulfate 2 gm IV x 1 K Phos Neutral 500 mg PO x 4 Check BMP, Mg, Phos with AM labs  Thank you for allowing pharmacy to be a part of this patient's care.  Littie Deeds, PharmD PGY1 Pharmacy Resident 10/19/2022 8:01 AM

## 2022-10-19 NOTE — TOC Progression Note (Signed)
Transition of Care [facility]) - Progression Note    Patient Details  Name: Levi Mejia MRN: [id number] Date of Birth: [birth year]  Transition of [facility]) CM/SW Contact  Margarito Liner, LCSW Phone Number: 10/19/2022, 11:30 AM  Clinical Narrative:  CSW met with patient and offered SA resources. He stated that treatment was being set up through his PCP but he was willing to accept the resources provided. Patient is hoping to discharge home today. His wife will pick him up.   Expected Discharge Plan: Home/Self Care Barriers to Discharge: Continued Medical Work up  Expected Discharge Plan and Services       Living arrangements for the past 2 months: Single Family Home                                       Social Determinants of Health (SDOH) Interventions SDOH Screenings   Food Insecurity: No Food Insecurity (10/17/2022)  Housing: Low Risk  (10/17/2022)  Transportation Needs: No Transportation Needs (10/17/2022)  Utilities: Not At Risk (10/17/2022)  Alcohol Screen: Medium Risk (08/08/2022)  Depression (PHQ2-9): Low Risk  (10/14/2022)  Financial Resource Strain: Low Risk  (08/08/2022)  Physical Activity: Sufficiently Active (08/08/2022)  Recent Concern: Physical Activity - Inactive (07/14/2022)  Social Connections: Moderately Isolated (08/08/2022)  Stress: No Stress Concern Present (08/08/2022)  Tobacco Use: Low Risk  (10/17/2022)    Readmission Risk Interventions    10/18/2022    3:02 PM  Readmission Risk Prevention Plan  Transportation Screening Complete  PCP or Specialist Appt within 3-5 Days Complete  HRI or Home Care Consult Complete  Social Work Consult for Recovery Care Planning/Counseling Complete  Palliative Care Screening Not Complete  Medication Review Oceanographer) Complete

## 2022-10-19 NOTE — Progress Notes (Signed)
Discharge instructions reviewed with patient including followup visits and new medications.  Understanding was verbalized and all questions were answered.  IV removed without complication; patient tolerated well.  Work note printed and given to patient.  Patient discharged home via wheelchair in stable condition escorted by volunteer staff.

## 2022-10-19 NOTE — Discharge Summary (Signed)
Physician Discharge Summary   Patient: Levi Mejia MRN: [id number] DOB: August 16, 1970  Admit date:     10/17/2022  Discharge date: 10/19/2022  Discharge Physician: Pennie Banter   PCP: Dana Allan, MD   Recommendations at discharge:   Follow up with Primary Care next week Repeat CMP, Mg, Phos, PT/INR on 10/25/22 or as close as possible (day 7 of steroids for alcoholic hepatitis) Follow up with GI in 3-4 weeks Follow up on patient's cessation from alcohol and consider initiation of naltrexone or other medication to support alcohol cessation if indicated and no contraindications  Discharge Diagnoses: Principal Problem:   Alcohol withdrawal with inpatient treatment [facility]) Active Problems:   Alcohol dependence (HCC)   Hypokalemia   Hyponatremia   Hypomagnesemia   Essential hypertension, benign   Abnormal LFTs   Alcoholic hepatitis   Alcohol withdrawal (HCC)   Nausea and vomiting   Weight loss, non-intentional   Hyperlipidemia   History of CVA (cerebrovascular accident)   Hypophosphatemia   Malnutrition of moderate degree  Resolved Problems:   * No resolved hospital problems. *  Hospital Course: "Levi Mejia is a 52 year old male with history of alcohol dependence, elevated LFTs, history of hypokalemia, hypomagnesemia, hyponatremia, liver steatosis who presents the ED for chief concerns of weakness, poor p.o. intake, nausea, vomiting.   At bedside, patient was able to tell me her name, age, location, current calendar year.   He reports that over the last 6 months he has had poor p.o. intake from lack of appetite.  He reports that the meal he had in the emergency department at bedside is his first meal in 6 days.   He denies chest pain, shortness of breath, dysuria, hematuria, diarrhea, swelling of his lower extremities, syncope, loss of conscious, blood in his stool.   He reports that he would like to detox as he was having some difficulty refraining from  alcohol use at home.  He endorses weakness and nausea and vomiting with chills at night.   He reports the nausea and vomiting has improved significantly.   Reports that over the last unintentionally lost approximately 20 pounds.   Healthcare maintenance: Patient denies ever having a colonoscopy.  He denies changes to his bowel habits.  He denies pencil thin stool."  Further hospital course and management as outlined below.   8/21 -- pt doing well, no longer having withdrawal symptoms. Electrolytes stabilized.  N/V improved and tolerating meals. Pt is medically stable and requests discharge home today. He prefers to discuss possible medication for alcohol with primary care if he has issues, currently denies cravings.  Assessment and Plan:    # Alcohol use with dependence # Alcohol withdrawal syndrome # History alcohol withdrawal seizure Last drink PM 8/16. Does have history of complicated withdrawal (seizure) few years ago.  This admission symptoms were relatively mild, now resolved - monitored on CIWA with PRN ativan - given vitamins, IV fluids - TOC consulted for substance abuse resources - seizure precautions - PCP follow up to consider initiation of naltrexone or other medication if needed   # Alcohol hepatitis INR 2, tbili 6.4, AST 611, ALT 199. Maddrey discriminant function score is at least 40 suggesting poor prognosis. Follows with GI ([facility]) as outpt. - started prednisolone 40 daily - needs repeat labs at day 7 - pt agrees to make PCP appointment for lab visit - GI follow up outpatient - Continue PPI daily   # Nausea/vomiting - resolved 2/2 alcohol withdrawal. Improving, tolerated breakfast,  nothing acute on CT. Denies hematemesis - treated with IV fluids and anti-emetics   # Hypokalemia # Hypokalemia Resolved with replacement - monitor labs at follow up   # HTN Here bp mild/moderately elevated - resume home losartan   # Hx CVA On ASA at home. Not on statin  2/2 liver disease       Consultants: None Procedures performed: NOne  Disposition: Home Diet recommendation:  Cardiac diet DISCHARGE MEDICATION: Allergies as of 10/19/2022       Reactions   Lactose Intolerance (gi)         Medication List     TAKE these medications    aspirin EC 81 MG tablet Take 1 tablet (81 mg total) by mouth daily.   diclofenac Sodium 1 % Gel Commonly known as: VOLTAREN Apply 4 g topically 3 (three) times daily as needed.   feeding supplement Liqd Take 237 mLs by mouth 3 (three) times daily between meals.   folic acid 1 MG tablet Commonly known as: FOLVITE Take 1 tablet (1 mg total) by mouth daily.   losartan 25 MG tablet Commonly known as: COZAAR Take 0.5 tablets (12.5 mg total) by mouth daily.   multivitamin with minerals Tabs tablet Take 1 tablet by mouth daily.   ondansetron 4 MG tablet Commonly known as: Zofran Take 1 tablet (4 mg total) by mouth every 6 (six) hours as needed for up to 7 days for nausea or vomiting.   pantoprazole 40 MG tablet Commonly known as: PROTONIX Take 1 tablet (40 mg total) by mouth 2 (two) times daily before a meal.   phosphorus 155-852-130 MG tablet Commonly known as: K PHOS NEUTRAL Take 2 tablets (500 mg total) by mouth 3 (three) times daily for 3 days.   potassium chloride SA 20 MEQ tablet Commonly known as: KLOR-CON M Take 1 tablet (20 mEq total) by mouth daily for 5 doses.   prednisoLONE 5 MG Tabs tablet Take 2 tablets (10 mg total) by mouth daily for 26 days. Start taking on: October 20, 2022   thiamine 100 MG tablet Commonly known as: VITAMIN B1 Take 100 mg by mouth daily.        Follow-up Information     Dana Allan, MD. Schedule an appointment as soon as possible for a visit.   Specialty: Family Medicine Why: Needs hospital follow up and labs repeated on 10/25/22 Contact information: [address] [place] [address] [phone]         Toney Reil, MD. Call.    Specialty: Gastroenterology Why: Schedule follow up in 4 weeks Contact information: [address] [place] [address] [phone]                Discharge Exam: Filed Weights   10/17/22 0919 10/18/22 1617 10/19/22 0448  Weight: 71 kg 75.9 kg 73.2 kg   General exam: awake, alert, no acute distress HEENT: atraumatic, clear conjunctiva, anicteric sclera, moist mucus membranes, hearing grossly normal  Respiratory system: CTAB, no wheezes, rales or rhonchi, normal respiratory effort. Cardiovascular system: normal S1/S2, RRR, no JVD, murmurs, rubs, gallops, no pedal edema.   Gastrointestinal system: soft, NT, ND, no HSM felt, +bowel sounds. Central nervous system: A&O x3. no gross focal neurologic deficits, normal speech Extremities: moves all, no edema, normal tone Skin: dry, intact, normal temperature, normal color, No rashes, lesions or ulcers Psychiatry: normal mood, congruent affect, judgement and insight appear normal   Condition at discharge: stable  The results of significant diagnostics from  this hospitalization (including imaging, microbiology, ancillary and laboratory) are listed below for reference.   Imaging Studies: CT ABDOMEN PELVIS W CONTRAST  Result Date: 10/17/2022 CLINICAL DATA:  Acute abdominal pain and vomiting.  Alcohol misuse. EXAM: CT ABDOMEN AND PELVIS WITH CONTRAST TECHNIQUE: Multidetector CT imaging of the abdomen and pelvis was performed using the standard protocol following bolus administration of intravenous contrast. RADIATION DOSE REDUCTION: This exam was performed according to the departmental dose-optimization program which includes automated exposure control, adjustment of the mA and/or kV according to patient size and/or use of iterative reconstruction technique. CONTRAST:  OMNIPAQUE IOHEXOL 300 MG/ML  SOLN COMPARISON:  06/29/2022 FINDINGS: Lower Chest: No acute findings. Hepatobiliary: No suspicious hepatic masses identified.  Severe diffuse hepatic steatosis again seen. Gallbladder is unremarkable. No evidence of biliary ductal dilatation. Pancreas:  No mass or inflammatory changes. Spleen: Within normal limits in size and appearance. Adrenals/Urinary Tract: No suspicious masses identified. No evidence of ureteral calculi or hydronephrosis. Stomach/Bowel: No evidence of obstruction, inflammatory process or abnormal fluid collections. Normal appendix visualized. Mild diverticulosis of descending colon, without No evidence of diverticulitis. Vascular/Lymphatic: No pathologically enlarged lymph nodes. No acute vascular findings. Reproductive:  No mass or other significant abnormality. Other:  None. Musculoskeletal:  No suspicious bone lesions identified. IMPRESSION: No acute findings. Mild left colonic diverticulosis, without radiographic evidence of diverticulitis. Severe hepatic steatosis. Electronically Signed   By: Danae Orleans M.D.   On: 10/17/2022 14:28   US ABDOMEN LIMITED RUQ (LIVER/GB)  Result Date: 10/14/2022 CLINICAL DATA:  Transaminitis EXAM: ULTRASOUND ABDOMEN LIMITED RIGHT UPPER QUADRANT COMPARISON:  Abdominal ultrasound 06/29/2022 and CT abdomen and pelvis 06/29/2022 FINDINGS: Gallbladder: Gallbladder wall at the upper limits of normal measuring 4 mm likely due to liver pathology. Negative sonographic Murphy's sign noted by the sonographer. No pericholecystic fluid or cholelithiasis. Common bile duct: Diameter: 4 mm.  No intrahepatic biliary dilation. Liver: Marked hepatic steatosis. Portal vein is patent on color Doppler imaging with normal direction of blood flow towards the liver. Other: None. IMPRESSION: Marked hepatic steatosis.  No evidence of cholecystitis. Electronically Signed   By: Minerva Fester M.D.   On: 10/14/2022 21:05    Microbiology: No results found for this or any previous visit.  Labs: CBC: Recent Labs  Lab 10/14/22 1808 10/17/22 0942 10/18/22 0632 10/19/22 0354  WBC 7.4 8.0 6.1 6.3  HGB  15.9 13.5 12.8* 12.2*  HCT 44.1 36.2* 35.5* 35.0*  MCV 96.3 95.3 100.0 101.4*  PLT 169 122* 87* 102*   Basic Metabolic Panel: Recent Labs  Lab 10/14/22 1807 10/14/22 1808 10/17/22 0942 10/17/22 1700 10/17/22 1740 10/18/22 0632 10/19/22 0354  NA  --  130* 129*  --   --  135 135  K  --  2.7* 2.5*  --   --  3.1* 3.5  CL  --  83* 85*  --   --  99 101  CO2  --  29 30  --   --  29 26  GLUCOSE  --  104* 132*  --   --  98 99  BUN  --  11 5*  --   --  <5* 6  CREATININE  --  0.81 0.79  --   --  0.72 0.78  CALCIUM  --  8.9 8.0*  --   --  7.9* 8.2*  MG 1.8  --  1.2* 1.8  --   --  1.5*  PHOS  --   --   --   --  1.6*  --  1.5*   Liver Function Tests: Recent Labs  Lab 10/14/22 1808 10/17/22 0942 10/19/22 0354  AST 436* 611* 246*  ALT 158* 199* 144*  ALKPHOS 76 65 61  BILITOT 3.3* 6.4* 2.8*  PROT 8.6* 7.2 6.2*  ALBUMIN 4.5 3.8 3.2*   CBG: No results for input(s): "GLUCAP" in the last 168 hours.  Discharge time spent: greater than 30 minutes.  Signed: Pennie Banter, DO [facility] 10/19/2022

## 2022-10-19 NOTE — TOC Transition Note (Signed)
Transition of Care [facility]) - CM/SW Discharge Note   Patient Details  Name: Levi Mejia MRN: [id number] Date of Birth: 11/23/1970  Transition of Care [facility]) CM/SW Contact:  Margarito Liner, LCSW Phone Number: 10/19/2022, 12:27 PM   Clinical Narrative:   Patient has orders to discharge home today. No further TOC needs. CSW signing off.  Final next level of care: Home/Self Care Barriers to Discharge: Barriers Resolved   Patient Goals and CMS Choice      Discharge Placement                  Patient to be transferred to facility by: Wife   Patient and family notified of of transfer: 10/19/22  Discharge Plan and Services Additional resources added to the After Visit Summary for                                       Social Determinants of Health (SDOH) Interventions SDOH Screenings   Food Insecurity: No Food Insecurity (10/17/2022)  Housing: Low Risk  (10/17/2022)  Transportation Needs: No Transportation Needs (10/17/2022)  Utilities: Not At Risk (10/17/2022)  Alcohol Screen: Medium Risk (08/08/2022)  Depression (PHQ2-9): Low Risk  (10/14/2022)  Financial Resource Strain: Low Risk  (08/08/2022)  Physical Activity: Sufficiently Active (08/08/2022)  Recent Concern: Physical Activity - Inactive (07/14/2022)  Social Connections: Moderately Isolated (08/08/2022)  Stress: No Stress Concern Present (08/08/2022)  Tobacco Use: Low Risk  (10/17/2022)     Readmission Risk Interventions    10/18/2022    3:02 PM  Readmission Risk Prevention Plan  Transportation Screening Complete  PCP or Specialist Appt within 3-5 Days Complete  HRI or Home Care Consult Complete  Social Work Consult for Recovery Care Planning/Counseling Complete  Palliative Care Screening Not Complete  Medication Review Oceanographer) Complete

## 2022-10-20 ENCOUNTER — Telehealth: Payer: Self-pay

## 2022-10-20 NOTE — Transitions of Care (Post Inpatient/ED Visit) (Signed)
10/20/2022  Name: Levi Mejia [name] MRN: [id number] DOB: 1970-08-23  Today's TOC FU Call Status: Today's TOC FU Call Status:: Successful TOC FU Call Completed TOC FU Call Complete Date: 10/20/22  Transition Care Management Follow-up Telephone Call Date of Discharge: 10/19/22 Discharge Facility: [facility]) Type of Discharge: Inpatient Admission Primary Inpatient Discharge Diagnosis:: alcohol use How have you been since you were released from the hospital?: Better Any questions or concerns?: No  Items Reviewed: Did you receive and understand the discharge instructions provided?: Yes Medications obtained,verified, and reconciled?: Yes (Medications Reviewed) Any new allergies since your discharge?: No Dietary orders reviewed?: Yes Do you have support at home?: Yes People in Home: spouse  Medications Reviewed Today: Medications Reviewed Today     Reviewed by Karena Addison, LPN (Licensed Practical Nurse) on 10/20/22 at 0920  Med List Status: <None>   Medication Order Taking? Sig Documenting Provider Last Dose Status Informant  aspirin EC 81 MG tablet [id number] No Take 1 tablet (81 mg total) by mouth daily. Sunnie Nielsen, DO 10/13/2022 Active Self  diclofenac Sodium (VOLTAREN) 1 % GEL [id number] No Apply 4 g topically 3 (three) times daily as needed. Allegra Grana, FNP unk Active Self  feeding supplement (ENSURE ENLIVE / ENSURE PLUS) LIQD [id number]  Take 237 mLs by mouth 3 (three) times daily between meals. Pennie Banter, DO  Active   folic acid (FOLVITE) 1 MG tablet [id number] No Take 1 tablet (1 mg total) by mouth daily. Dana Allan, MD 10/13/2022 Active Self  losartan (COZAAR) 25 MG tablet [id number] No Take 0.5 tablets (12.5 mg total) by mouth daily. Allegra Grana, FNP 10/13/2022 Active Self  Multiple Vitamin (MULTIVITAMIN WITH MINERALS) TABS tablet [id number] No Take 1 tablet by mouth daily. Sunnie Nielsen, DO Past Week Active  Self  ondansetron (ZOFRAN) 4 MG tablet [id number] No Take 1 tablet (4 mg total) by mouth every 6 (six) hours as needed for up to 7 days for nausea or vomiting. Trinna Post, MD unk Active Self  pantoprazole (PROTONIX) 40 MG tablet [id number] No Take 1 tablet (40 mg total) by mouth 2 (two) times daily before a meal. Dana Allan, MD 10/13/2022 Active Self  phosphorus (K PHOS NEUTRAL) [phone] MG tablet [id number]  Take 2 tablets (500 mg total) by mouth 3 (three) times daily for 3 days. Esaw Grandchild A, DO  Active   potassium chloride SA (KLOR-CON M) 20 MEQ tablet [id number]  Take 1 tablet (20 mEq total) by mouth daily for 5 doses. Esaw Grandchild A, DO  Active   prednisoLONE 5 MG TABS tablet [id number]  Take 2 tablets (10 mg total) by mouth daily for 26 days. Esaw Grandchild A, DO  Active   thiamine (VITAMIN B1) 100 MG tablet [id number] No Take 100 mg by mouth daily. [provider] Not Taking Active Self           Med Note Barnie Alderman Oct 17, 2022  3:12 PM) Haven't started            Home Care and Equipment/Supplies: Were Home Health Services Ordered?: NA Any new equipment or medical supplies ordered?: NA  Functional Questionnaire: Do you need assistance with bathing/showering or dressing?: No Do you need assistance with meal preparation?: No Do you need assistance with eating?: No Do you have difficulty maintaining continence: No Do you need assistance with getting out of bed/getting out of a chair/moving?: No Do you have difficulty managing or taking your medications?:  No  Follow up appointments reviewed: PCP Follow-up appointment confirmed?: Yes Date of PCP follow-up appointment?: 10/25/22 Follow-up Provider: [facility] Follow-up appointment confirmed?: Yes Date of Specialist follow-up appointment?: 10/26/22 Follow-Up Specialty Provider:: GI Do you need transportation to your follow-up appointment?: No Do you understand care options if your  condition(s) worsen?: Yes-patient verbalized understanding    SIGNATURE Karena Addison, LPN [facility] Nurse Health Advisor Direct Dial [phone]

## 2022-10-25 ENCOUNTER — Inpatient Hospital Stay: Payer: PRIVATE HEALTH INSURANCE | Admitting: Family Medicine

## 2022-10-26 ENCOUNTER — Ambulatory Visit: Payer: PRIVATE HEALTH INSURANCE | Admitting: Gastroenterology

## 2022-12-01 ENCOUNTER — Other Ambulatory Visit: Payer: Self-pay

## 2022-12-01 ENCOUNTER — Emergency Department
Admission: EM | Admit: 2022-12-01 | Discharge: 2022-12-01 | Disposition: A | Discharge status: Home / Self Care | Payer: PRIVATE HEALTH INSURANCE | Attending: Emergency Medicine | Admitting: Emergency Medicine

## 2022-12-01 ENCOUNTER — Emergency Department: Payer: PRIVATE HEALTH INSURANCE

## 2022-12-01 DIAGNOSIS — I10 Essential (primary) hypertension: Secondary | ICD-10-CM | POA: Diagnosis not present

## 2022-12-01 DIAGNOSIS — F1010 Alcohol abuse, uncomplicated: Secondary | ICD-10-CM | POA: Insufficient documentation

## 2022-12-01 DIAGNOSIS — R1031 Right lower quadrant pain: Secondary | ICD-10-CM | POA: Diagnosis present

## 2022-12-01 LAB — COMPREHENSIVE METABOLIC PANEL
ALT: 99 U/L — ABNORMAL HIGH (ref 0–44)
AST: 293 U/L — ABNORMAL HIGH (ref 15–41)
Albumin: 4.9 g/dL (ref 3.5–5.0)
Alkaline Phosphatase: 77 U/L (ref 38–126)
Anion gap: 20 — ABNORMAL HIGH (ref 5–15)
BUN: 8 mg/dL (ref 6–20)
CO2: 26 mmol/L (ref 22–32)
Calcium: 9.3 mg/dL (ref 8.9–10.3)
Chloride: 88 mmol/L — ABNORMAL LOW (ref 98–111)
Creatinine, Ser: 0.9 mg/dL (ref 0.61–1.24)
GFR, Estimated: >60 mL/min (ref >60)
Glucose, Bld: 113 mg/dL — ABNORMAL HIGH (ref 70–99)
Potassium: 2.9 mmol/L — ABNORMAL LOW (ref 3.5–5.1)
Sodium: 134 mmol/L — ABNORMAL LOW (ref 135–145)
Total Bilirubin: 2.2 mg/dL — ABNORMAL HIGH (ref 0.3–1.2)
Total Protein: 9.3 g/dL — ABNORMAL HIGH (ref 6.5–8.1)

## 2022-12-01 LAB — URINALYSIS, ROUTINE W REFLEX MICROSCOPIC
Bilirubin Urine: NEGATIVE
Glucose, UA: NEGATIVE mg/dL
Hgb urine dipstick: NEGATIVE
Ketones, ur: NEGATIVE mg/dL
Leukocytes,Ua: NEGATIVE
Nitrite: NEGATIVE
Protein, ur: NEGATIVE mg/dL
Specific Gravity, Urine: 1.018 (ref 1.005–1.030)
pH: 6 (ref 5.0–8.0)

## 2022-12-01 LAB — CBC
HCT: 44.3 % (ref 39.0–52.0)
Hemoglobin: 16.6 g/dL (ref 13.0–17.0)
MCH: 35.9 pg — ABNORMAL HIGH (ref 26.0–34.0)
MCHC: 37.5 g/dL — ABNORMAL HIGH (ref 30.0–36.0)
MCV: 95.7 fL (ref 80.0–100.0)
Platelets: 188 10*3/uL (ref 150–400)
RBC: 4.63 MIL/uL (ref 4.22–5.81)
RDW: 11.4 % — ABNORMAL LOW (ref 11.5–15.5)
WBC: 8 10*3/uL (ref 4.0–10.5)
nRBC: 0 % (ref 0.0–0.2)

## 2022-12-01 LAB — MAGNESIUM: Magnesium: 2 mg/dL (ref 1.7–2.4)

## 2022-12-01 LAB — LIPASE, BLOOD: Lipase: 37 U/L (ref 11–51)

## 2022-12-01 MED ORDER — ONDANSETRON HCL 4 MG/2ML IJ SOLN
4.0000 mg | Freq: Once | INTRAMUSCULAR | Status: AC
  Administered 2022-12-01: 4 mg via INTRAVENOUS
  Filled 2022-12-01: qty 2

## 2022-12-01 MED ORDER — POTASSIUM CHLORIDE CRYS ER 20 MEQ PO TBCR: 20.0000 meq | EXTENDED_RELEASE_TABLET | Freq: Two times a day (BID) | ORAL | 0 refills | Status: DC

## 2022-12-01 MED ORDER — IOHEXOL 300 MG/ML  SOLN
100.0000 mL | Freq: Once | INTRAMUSCULAR | Status: AC | PRN
  Administered 2022-12-01: 100 mL via INTRAVENOUS

## 2022-12-01 MED ORDER — MORPHINE SULFATE (PF) 4 MG/ML IV SOLN
4.0000 mg | Freq: Once | INTRAVENOUS | Status: AC
  Administered 2022-12-01: 4 mg via INTRAVENOUS
  Filled 2022-12-01: qty 1

## 2022-12-01 MED ORDER — SODIUM CHLORIDE 0.9 % IV BOLUS
1000.0000 mL | Freq: Once | INTRAVENOUS | Status: AC
  Administered 2022-12-01: 1000 mL via INTRAVENOUS

## 2022-12-01 MED ORDER — ONDANSETRON 4 MG PO TBDP: 4.0000 mg | ORAL_TABLET | Freq: Three times a day (TID) | ORAL | 0 refills | Status: DC | PRN

## 2022-12-01 NOTE — ED Provider Notes (Signed)
[facility] Provider Note    Event Date/Time   First MD Initiated Contact with Patient 12/01/22 1726     (approximate)   History   Chief Complaint Abdominal Pain   HPI  Levi Mejia is a 52 y.o. male with past medical history of hypertension, alcohol abuse, seizures, and stroke who presents to the ED complaining of abdominal pain.  Patient reports that he has been dealing with pain in the right lower quadrant of his abdomen for about the past 3 days with nausea and multiple episodes of vomiting.  He has not had any associated diarrhea and denies any fevers, dysuria, or flank pain.  He has not had any pain in his testicles and denies any swelling in his groin.  He has not noticed any blood in his emesis or stool.     Physical Exam   Triage Vital Signs: ED Triage Vitals  Encounter Vitals Group     BP 12/01/22 1538 (!) 147/105     Systolic BP Percentile --      Diastolic BP Percentile --      Pulse Rate 12/01/22 1538 100     Resp 12/01/22 1538 19     Temp 12/01/22 1538 98.3 F (36.8 C)     Temp Source 12/01/22 1538 Oral     SpO2 12/01/22 1538 95 %     Weight 12/01/22 1539 165 lb (74.8 kg)     Height 12/01/22 1539 5\' 7"  (1.702 m)     Head Circumference --      Peak Flow --      Pain Score 12/01/22 1539 4     Pain Loc --      Pain Education --      Exclude from Growth Chart --     Most recent vital signs: Vitals:   12/01/22 1538 12/01/22 2014  BP: (!) 147/105 (!) 134/90  Pulse: 100 85  Resp: 19 20  Temp: 98.3 F (36.8 C) 98.1 F (36.7 C)  SpO2: 95% 100%    Constitutional: Alert and oriented. Eyes: Conjunctivae are normal. Head: Atraumatic. Nose: No congestion/rhinnorhea. Mouth/Throat: Mucous membranes are moist.  Cardiovascular: Normal rate, regular rhythm. Grossly normal heart sounds.  2+ radial pulses bilaterally. Respiratory: Normal respiratory effort.  No retractions. Lungs CTAB. Gastrointestinal: Soft and tender to  palpation in the right lower quadrant with no rebound or guarding. No distention. Musculoskeletal: No lower extremity tenderness nor edema.  Neurologic:  Normal speech and language. No gross focal neurologic deficits are appreciated.    ED Results / Procedures / Treatments   Labs (all labs ordered are listed, but only abnormal results are displayed) Labs Reviewed  COMPREHENSIVE METABOLIC PANEL - Abnormal; Notable for the following components:      Result Value   Sodium 134 (*)    Potassium 2.9 (*)    Chloride 88 (*)    Glucose, Bld 113 (*)    Total Protein 9.3 (*)    AST 293 (*)    ALT 99 (*)    Total Bilirubin 2.2 (*)    Anion gap 20 (*)    All other components within normal limits  CBC - Abnormal; Notable for the following components:   MCH 35.9 (*)    MCHC 37.5 (*)    RDW 11.4 (*)    All other components within normal limits  URINALYSIS, ROUTINE W REFLEX MICROSCOPIC - Abnormal; Notable for the following components:   Color, Urine STRAW (*)  APPearance CLEAR (*)    All other components within normal limits  LIPASE, BLOOD  MAGNESIUM   RADIOLOGY CT abdomen/pelvis reviewed and interpreted by me with no inflammatory changes, focal fluid collections, or dilated bowel loops.  PROCEDURES:  Critical Care performed: No  Procedures   MEDICATIONS ORDERED IN ED: Medications  iohexol (OMNIPAQUE) 300 MG/ML solution 100 mL (100 mLs Intravenous Contrast Given 12/01/22 1622)  morphine (PF) 4 MG/ML injection 4 mg (4 mg Intravenous Given 12/01/22 1802)  ondansetron (ZOFRAN) injection 4 mg (4 mg Intravenous Given 12/01/22 1802)  sodium chloride 0.9 % bolus 1,000 mL (0 mLs Intravenous Stopped 12/01/22 2007)     IMPRESSION / MDM / ASSESSMENT AND PLAN / ED COURSE  I reviewed the triage vital signs and the nursing notes.                              52 y.o. male with past medical history of hypertension, alcohol abuse, seizures, and stroke who presents to the ED with 3 days of  increasing right lower quadrant abdominal pain associated with nausea and vomiting.  Patient's presentation is most consistent with acute presentation with potential threat to life or bodily function.  Differential diagnosis includes, but is not limited to, appendicitis, kidney stone, UTI, bowel obstruction, gastroenteritis, dehydration, electrolyte abnormality, AKI.  Patient nontoxic-appearing and in no acute distress, vital signs are unremarkable.  Abdomen is soft but he does have focal tenderness in the right lower quadrant of his abdomen, examination of his groin is unremarkable with no testicular swelling or tenderness.  Labs with no significant anemia or leukocytosis, he does have hypokalemia but no hypomagnesemia or AKI.  Transaminitis appears chronic and stable compared to previous, patient admits to ongoing alcohol consumption and does not wish to quit.  Lipase is within normal limits.  CT imaging is unremarkable and patient reports feeling better following IV morphine, Zofran, and fluids.  He is tolerating oral intake without difficulty and is appropriate for outpatient management, will be prescribed Zofran and supplemental potassium.  He was counseled to return to the ED for new or worsening symptoms.  Patient agrees with plan.      FINAL CLINICAL IMPRESSION(S) / ED DIAGNOSES   Final diagnoses:  Right lower quadrant abdominal pain  Alcohol abuse     Rx / DC Orders   ED Discharge Orders          Ordered    ondansetron (ZOFRAN-ODT) 4 MG disintegrating tablet  Every 8 hours PRN        12/01/22 2016    potassium chloride SA (KLOR-CON M) 20 MEQ tablet  2 times daily        12/01/22 2140             Note:  This document was prepared using Dragon voice recognition software and may include unintentional dictation errors.   Chesley Noon, MD 12/01/22 2140

## 2022-12-01 NOTE — ED Triage Notes (Signed)
PT sts that he has been having lower right abd pain. Pt advises that he still have his appendix. Pt advises that he has been having N/V with abd pain for the last three days.

## 2022-12-01 NOTE — ED Notes (Signed)
Known history of ETOH abuse.  Last drink was this afternoon.  Placed in recliner chair in Triage for safety.

## 2022-12-02 ENCOUNTER — Telehealth: Payer: Self-pay

## 2022-12-02 ENCOUNTER — Other Ambulatory Visit: Payer: Self-pay | Admitting: Family Medicine

## 2022-12-02 DIAGNOSIS — Z1212 Encounter for screening for malignant neoplasm of rectum: Secondary | ICD-10-CM

## 2022-12-02 DIAGNOSIS — Z1211 Encounter for screening for malignant neoplasm of colon: Secondary | ICD-10-CM

## 2022-12-02 NOTE — Transitions of Care (Post Inpatient/ED Visit) (Signed)
Unable to reach patient by phone and left v/m requesting call back at [phone].      12/02/2022  Name: Levi Mejia MRN: [id number] DOB: [birth year]  Today's TOC FU Call Status: Today's TOC FU Call Status:: Unsuccessful Call (1st Attempt) Unsuccessful Call (1st Attempt) Date: 12/02/22  Attempted to reach the patient regarding the most recent Inpatient/ED visit.  Follow Up Plan: Additional outreach attempts will be made to reach the patient to complete the Transitions of Care (Post Inpatient/ED visit) call.   Signature Lewanda Rife, LPN

## 2023-01-10 ENCOUNTER — Emergency Department: Payer: Self-pay

## 2023-01-10 ENCOUNTER — Other Ambulatory Visit: Payer: Self-pay

## 2023-01-10 ENCOUNTER — Emergency Department
Admission: EM | Admit: 2023-01-10 | Discharge: 2023-01-10 | Disposition: A | Discharge status: Home / Self Care | Payer: Self-pay | Attending: Emergency Medicine | Admitting: Emergency Medicine

## 2023-01-10 ENCOUNTER — Encounter: Payer: Self-pay | Admitting: Emergency Medicine

## 2023-01-10 DIAGNOSIS — Y908 Blood alcohol level of 240 mg/100 ml or more: Secondary | ICD-10-CM | POA: Insufficient documentation

## 2023-01-10 DIAGNOSIS — R079 Chest pain, unspecified: Secondary | ICD-10-CM

## 2023-01-10 DIAGNOSIS — R410 Disorientation, unspecified: Secondary | ICD-10-CM | POA: Insufficient documentation

## 2023-01-10 DIAGNOSIS — F1090 Alcohol use, unspecified, uncomplicated: Secondary | ICD-10-CM | POA: Insufficient documentation

## 2023-01-10 DIAGNOSIS — E876 Hypokalemia: Secondary | ICD-10-CM

## 2023-01-10 LAB — CBC
HCT: 43.6 % (ref 39.0–52.0)
Hemoglobin: 15.9 g/dL (ref 13.0–17.0)
MCH: 35.3 pg — ABNORMAL HIGH (ref 26.0–34.0)
MCHC: 36.5 g/dL — ABNORMAL HIGH (ref 30.0–36.0)
MCV: 96.7 fL (ref 80.0–100.0)
Platelets: 197 10*3/uL (ref 150–400)
RBC: 4.51 MIL/uL (ref 4.22–5.81)
RDW: 11.1 % — ABNORMAL LOW (ref 11.5–15.5)
WBC: 7.7 10*3/uL (ref 4.0–10.5)
nRBC: 0 % (ref 0.0–0.2)

## 2023-01-10 LAB — BASIC METABOLIC PANEL
Anion gap: 18 — ABNORMAL HIGH (ref 5–15)
BUN: <5 mg/dL — ABNORMAL LOW (ref 6–20)
CO2: 27 mmol/L (ref 22–32)
Calcium: 8.4 mg/dL — ABNORMAL LOW (ref 8.9–10.3)
Chloride: 91 mmol/L — ABNORMAL LOW (ref 98–111)
Creatinine, Ser: 0.82 mg/dL (ref 0.61–1.24)
GFR, Estimated: >60 mL/min (ref >60)
Glucose, Bld: 126 mg/dL — ABNORMAL HIGH (ref 70–99)
Potassium: 2.6 mmol/L — CL (ref 3.5–5.1)
Sodium: 136 mmol/L (ref 135–145)

## 2023-01-10 LAB — ETHANOL: Alcohol, Ethyl (B): 306 mg/dL (ref <10)

## 2023-01-10 LAB — TROPONIN I (HIGH SENSITIVITY)
Troponin I (High Sensitivity): 6 ng/L (ref <18)
Troponin I (High Sensitivity): 5 ng/L (ref <18)

## 2023-01-10 LAB — MAGNESIUM: Magnesium: 1.5 mg/dL — ABNORMAL LOW (ref 1.7–2.4)

## 2023-01-10 MED ORDER — ADULT MULTIVITAMIN W/MINERALS CH
1.0000 | ORAL_TABLET | Freq: Every day | ORAL | Status: DC
Start: 2023-01-10 — End: 2023-01-10
  Administered 2023-01-10: 1 via ORAL
  Filled 2023-01-10: qty 1

## 2023-01-10 MED ORDER — THIAMINE MONONITRATE 100 MG PO TABS
100.0000 mg | ORAL_TABLET | Freq: Every day | ORAL | Status: DC
Start: 2023-01-10 — End: 2023-01-10
  Administered 2023-01-10: 100 mg via ORAL
  Filled 2023-01-10: qty 1

## 2023-01-10 MED ORDER — ONDANSETRON HCL 4 MG/2ML IJ SOLN
4.0000 mg | Freq: Once | INTRAMUSCULAR | Status: AC
  Administered 2023-01-10: 4 mg via INTRAVENOUS
  Filled 2023-01-10: qty 2

## 2023-01-10 MED ORDER — POTASSIUM CHLORIDE 20 MEQ PO PACK
40.0000 meq | PACK | Freq: Two times a day (BID) | ORAL | Status: DC
  Administered 2023-01-10: 40 meq via ORAL
  Filled 2023-01-10: qty 2

## 2023-01-10 MED ORDER — MAGNESIUM SULFATE 2 GM/50ML IV SOLN
2.0000 g | Freq: Once | INTRAVENOUS | Status: AC
  Administered 2023-01-10: 2 g via INTRAVENOUS
  Filled 2023-01-10: qty 50

## 2023-01-10 MED ORDER — FOLIC ACID 1 MG PO TABS
1.0000 mg | ORAL_TABLET | Freq: Every day | ORAL | Status: DC
Start: 2023-01-10 — End: 2023-01-10
  Administered 2023-01-10: 1 mg via ORAL
  Filled 2023-01-10: qty 1

## 2023-01-10 NOTE — ED Triage Notes (Addendum)
Patient to ED via POV for left sided CP. Wife reports disorientation at home- able to slowly answer all questions in triage. Patient noted to keep nodding off while in triage. Pt reports taking medication for anxiety approx 3 hr ago.

## 2023-01-10 NOTE — ED Notes (Signed)
Pt reports vomiting everyday with a "small" amount of blood noted each time. EDP made aware.

## 2023-01-10 NOTE — ED Provider Notes (Signed)
[facility] Provider Note    Event Date/Time   First MD Initiated Contact with Patient 01/10/23 1323     (approximate)   History   Chest Pain   HPI  Levi Mejia is a 52 y.o. male who presents to the emergency department today because of concerns for chest pain.  Patient states it is located in the left side of his chest.  Has been present for the past couple of days.  It started while the patient was at work.  It will be intermittent.  He denies any associated shortness of breath or nausea or vomiting.  In addition apparently family has noticed that the patient has been a little confused.  Patient himself feels like he has also been somewhat confused.  He is a an alcohol user and states last alcohol use was yesterday.     Physical Exam   Triage Vital Signs: ED Triage Vitals  Encounter Vitals Group     BP 01/10/23 1327 (!) 144/100     Systolic BP Percentile --      Diastolic BP Percentile --      Pulse Rate 01/10/23 1327 82     Resp 01/10/23 1327 18     Temp 01/10/23 1327 98.1 F (36.7 C)     Temp Source 01/10/23 1327 Oral     SpO2 01/10/23 1327 96 %     Weight 01/10/23 1325 165 lb (74.8 kg)     Height 01/10/23 1325 5\' 11"  (1.803 m)     Head Circumference --      Peak Flow --      Pain Score 01/10/23 1325 5     Pain Loc --      Pain Education --      Exclude from Growth Chart --     Most recent vital signs: Vitals:   01/10/23 1327  BP: (!) 144/100  Pulse: 82  Resp: 18  Temp: 98.1 F (36.7 C)  SpO2: 96%   General: Awake, alert, oriented. CV:  Good peripheral perfusion. Regular rate and rhythm. Resp:  Normal effort. Lungs clear. Abd:  No distention.  Other:  Occasional episodes where the patient will appear to black out for a few second with some twitching prior to waking up without confusion or disorientation.   ED Results / Procedures / Treatments   Labs (all labs ordered are listed, but only abnormal results are  displayed) Labs Reviewed  BASIC METABOLIC PANEL - Abnormal; Notable for the following components:      Result Value   Potassium 2.6 (*)    Chloride 91 (*)    Glucose, Bld 126 (*)    BUN <5 (*)    Calcium 8.4 (*)    Anion gap 18 (*)    All other components within normal limits  CBC - Abnormal; Notable for the following components:   MCH 35.3 (*)    MCHC 36.5 (*)    RDW 11.1 (*)    All other components within normal limits  ETHANOL - Abnormal; Notable for the following components:   Alcohol, Ethyl (B) 306 (*)    All other components within normal limits  TROPONIN I (HIGH SENSITIVITY)     EKG  I, Phineas Semen, attending physician, personally viewed and interpreted this EKG  EKG Time: 1330 Rate: 86 Rhythm: sinus rhythm Axis: normal Intervals: qtc 514 QRS: narrow ST changes: no st elevation Impression: significant artifact limits evaluation    RADIOLOGY I independently interpreted  and visualized the CXR. My interpretation: No pneumonia Radiology interpretation: Pending at time of sign out  I independently interpreted and visualized the CT head. My interpretation: No bleed Radiology interpretation:  IMPRESSION:  1. No acute intracranial abnormality.  2. 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     PROCEDURES:  Critical Care performed: No   MEDICATIONS ORDERED IN ED: Medications - No data to display   IMPRESSION / MDM / ASSESSMENT AND PLAN / ED COURSE  I reviewed the triage vital signs and the nursing notes.                              Differential diagnosis includes, but is not limited to, withdrawal, ACS, pneumonia, PTX, gerd  Patient's presentation is most consistent with acute presentation with potential threat to life or bodily function.   The patient is on the cardiac monitor to evaluate for evidence of arrhythmia and/or  significant heart rate changes.  Patient presented to the emergency department today because of concerns for left sided chest pain.  EKG does have significant artifact however no obvious ST elevation.  Blood work does show hypokalemia.  Will add on [employer] level.  Given the level of alcohol use will give patient folate and thiamine.  Chest x-ray without any obvious pneumonia or pneumothorax per my read.  Will obtain CT scan as well given confusion and odd episodes of syncope like behavior here in the emergency department.     FINAL CLINICAL IMPRESSION(S) / ED DIAGNOSES   Non specific chest pain     Note:  This document was prepared using Dragon voice recognition software and may include unintentional dictation errors.    Phineas Semen, MD 01/10/23 1520

## 2023-01-10 NOTE — ED Provider Notes (Signed)
  Physical Exam  BP 118/80   Pulse 84   Temp 98.1 F (36.7 C) (Oral)   Resp (!) 21   Ht 5\' 11"  (1.803 m)   Wt 74.8 kg   SpO2 99%   BMI 23.01 kg/m   Physical Exam  Procedures  Procedures  ED Course / MDM   Clinical Course as of 01/10/23 1731  Tue Jan 10, 2023  1730 Reassessed patient.  He is feeling much better at this time and completely back to baseline.  Has gotten potassium and magnesium repletion.  Tolerating p.o. without issue.  Will discharge with outpatient follow-up with his PCP. [DW]    Clinical Course User Index [DW] Janith Lima, MD   Medical Decision Making Amount and/or Complexity of Data Reviewed Labs: ordered. Radiology: ordered.  Risk OTC drugs. Prescription drug management.   Received patient in signout.  52 year old male with alcohol use disorder presenting today for chest pain.  Reportedly started while at work and has been intermittent.  Also reportedly had 1 episode of confusion.  Does note alcohol use today.  Initial laboratory workup notable for elevated alcohol level at 306 and hypokalemia 2.6.  Anion gap acidosis likely secondary to alcohol and dehydration.  Initial EKG and troponin negative.  Plan for repeat troponin, completion of CT and chest x-ray and reevaluation.  Second troponin negative.  Patient's magnesium also found to be low.  This was repleted here.  CT head and chest x-ray unremarkable.  Patient was reassessed and feeling much better at this time with no ongoing symptoms.  Patient feels safe with discharge and will follow-up with his PCP.  Recommend abstinence from alcohol.  Given strict return precautions.     Janith Lima, MD 01/10/23 1739

## 2023-01-10 NOTE — ED Notes (Signed)
Pt states he drinks "a lot" of alcohol everyday. No changes in medications or recent injuries. States he hasn't eaten in the last "6 days" d/t no appetite. Only change is new job but denies stress.

## 2023-01-10 NOTE — ED Notes (Signed)
Pt was taken to CT before RN could give meds. Will give when pt returns.

## 2023-01-10 NOTE — ED Notes (Signed)
Lab called with critical results: ETOH is 306, K+ is 2.6. EDP Derrill Kay informed.

## 2023-01-10 NOTE — ED Notes (Signed)
EDP at bedside with pt.  

## 2023-01-10 NOTE — Discharge Instructions (Signed)
Please follow-up with your primary care provider within the week for reassessment.  They may need to recheck some labs looking at your potassium and magnesium.  Please return for any worsening symptoms.

## 2023-02-05 ENCOUNTER — Other Ambulatory Visit: Payer: Self-pay

## 2023-02-05 ENCOUNTER — Emergency Department: Payer: Self-pay

## 2023-02-05 ENCOUNTER — Emergency Department
Admission: EM | Admit: 2023-02-05 | Discharge: 2023-02-06 | Disposition: A | Discharge status: Home / Self Care | Payer: Self-pay | Attending: Emergency Medicine | Admitting: Emergency Medicine

## 2023-02-05 ENCOUNTER — Encounter: Payer: Self-pay | Admitting: Emergency Medicine

## 2023-02-05 DIAGNOSIS — R791 Abnormal coagulation profile: Secondary | ICD-10-CM | POA: Insufficient documentation

## 2023-02-05 DIAGNOSIS — F10129 Alcohol abuse with intoxication, unspecified: Secondary | ICD-10-CM | POA: Insufficient documentation

## 2023-02-05 DIAGNOSIS — Z7982 Long term (current) use of aspirin: Secondary | ICD-10-CM | POA: Insufficient documentation

## 2023-02-05 DIAGNOSIS — R531 Weakness: Secondary | ICD-10-CM | POA: Insufficient documentation

## 2023-02-05 DIAGNOSIS — R55 Syncope and collapse: Secondary | ICD-10-CM | POA: Insufficient documentation

## 2023-02-05 DIAGNOSIS — R0789 Other chest pain: Secondary | ICD-10-CM | POA: Insufficient documentation

## 2023-02-05 DIAGNOSIS — R0602 Shortness of breath: Secondary | ICD-10-CM | POA: Insufficient documentation

## 2023-02-05 DIAGNOSIS — Z8673 Personal history of transient ischemic attack (TIA), and cerebral infarction without residual deficits: Secondary | ICD-10-CM | POA: Insufficient documentation

## 2023-02-05 DIAGNOSIS — R4020 Unspecified coma: Secondary | ICD-10-CM

## 2023-02-05 DIAGNOSIS — R000 Tachycardia, unspecified: Secondary | ICD-10-CM | POA: Insufficient documentation

## 2023-02-05 DIAGNOSIS — Z79899 Other long term (current) drug therapy: Secondary | ICD-10-CM | POA: Insufficient documentation

## 2023-02-05 DIAGNOSIS — I10 Essential (primary) hypertension: Secondary | ICD-10-CM | POA: Insufficient documentation

## 2023-02-05 DIAGNOSIS — Y908 Blood alcohol level of 240 mg/100 ml or more: Secondary | ICD-10-CM | POA: Insufficient documentation

## 2023-02-05 DIAGNOSIS — R200 Anesthesia of skin: Secondary | ICD-10-CM | POA: Insufficient documentation

## 2023-02-05 DIAGNOSIS — F10920 Alcohol use, unspecified with intoxication, uncomplicated: Secondary | ICD-10-CM

## 2023-02-05 DIAGNOSIS — R079 Chest pain, unspecified: Secondary | ICD-10-CM

## 2023-02-05 LAB — ETHANOL: Alcohol, Ethyl (B): 313 mg/dL (ref <10)

## 2023-02-05 LAB — CBC WITH DIFFERENTIAL/PLATELET
Abs Immature Granulocytes: 0.08 10*3/uL — ABNORMAL HIGH (ref 0.00–0.07)
Basophils Absolute: 0.1 10*3/uL (ref 0.0–0.1)
Basophils Relative: 2 %
Eosinophils Absolute: 0.1 10*3/uL (ref 0.0–0.5)
Eosinophils Relative: 1 %
HCT: 40.9 % (ref 39.0–52.0)
Hemoglobin: 14.9 g/dL (ref 13.0–17.0)
Immature Granulocytes: 1 %
Lymphocytes Relative: 26 %
Lymphs Abs: 1.8 10*3/uL (ref 0.7–4.0)
MCH: 36.8 pg — ABNORMAL HIGH (ref 26.0–34.0)
MCHC: 36.4 g/dL — ABNORMAL HIGH (ref 30.0–36.0)
MCV: 101 fL — ABNORMAL HIGH (ref 80.0–100.0)
Monocytes Absolute: 0.6 10*3/uL (ref 0.1–1.0)
Monocytes Relative: 8 %
Neutro Abs: 4.2 10*3/uL (ref 1.7–7.7)
Neutrophils Relative %: 62 %
Platelets: 188 10*3/uL (ref 150–400)
RBC: 4.05 MIL/uL — ABNORMAL LOW (ref 4.22–5.81)
RDW: 12 % (ref 11.5–15.5)
WBC: 6.8 10*3/uL (ref 4.0–10.5)
nRBC: 0 % (ref 0.0–0.2)

## 2023-02-05 LAB — COMPREHENSIVE METABOLIC PANEL
ALT: 122 U/L — ABNORMAL HIGH (ref 0–44)
AST: 480 U/L — ABNORMAL HIGH (ref 15–41)
Albumin: 4 g/dL (ref 3.5–5.0)
Alkaline Phosphatase: 139 U/L — ABNORMAL HIGH (ref 38–126)
Anion gap: 16 — ABNORMAL HIGH (ref 5–15)
BUN: 9 mg/dL (ref 6–20)
CO2: 25 mmol/L (ref 22–32)
Calcium: 8.5 mg/dL — ABNORMAL LOW (ref 8.9–10.3)
Chloride: 94 mmol/L — ABNORMAL LOW (ref 98–111)
Creatinine, Ser: 0.77 mg/dL (ref 0.61–1.24)
GFR, Estimated: >60 mL/min (ref >60)
Glucose, Bld: 126 mg/dL — ABNORMAL HIGH (ref 70–99)
Potassium: 3.4 mmol/L — ABNORMAL LOW (ref 3.5–5.1)
Sodium: 135 mmol/L (ref 135–145)
Total Bilirubin: 2.4 mg/dL — ABNORMAL HIGH (ref <1.2)
Total Protein: 8 g/dL (ref 6.5–8.1)

## 2023-02-05 LAB — MAGNESIUM: Magnesium: 1.7 mg/dL (ref 1.7–2.4)

## 2023-02-05 LAB — TROPONIN I (HIGH SENSITIVITY): Troponin I (High Sensitivity): 10 ng/L (ref <18)

## 2023-02-05 MED ORDER — ALUM & MAG HYDROXIDE-SIMETH 200-200-20 MG/5ML PO SUSP
30.0000 mL | Freq: Once | ORAL | Status: AC
  Administered 2023-02-06: 30 mL via ORAL
  Filled 2023-02-05: qty 30

## 2023-02-05 MED ORDER — ONDANSETRON HCL 4 MG/2ML IJ SOLN
4.0000 mg | Freq: Once | INTRAMUSCULAR | Status: AC
  Administered 2023-02-05: 4 mg via INTRAVENOUS
  Filled 2023-02-05: qty 2

## 2023-02-05 MED ORDER — PANTOPRAZOLE SODIUM 40 MG IV SOLR
40.0000 mg | Freq: Once | INTRAVENOUS | Status: AC
  Administered 2023-02-05: 40 mg via INTRAVENOUS
  Filled 2023-02-05: qty 10

## 2023-02-05 MED ORDER — SODIUM CHLORIDE 0.9 % IV BOLUS (SEPSIS)
1000.0000 mL | Freq: Once | INTRAVENOUS | Status: AC
  Administered 2023-02-05: 1000 mL via INTRAVENOUS

## 2023-02-05 MED ORDER — THIAMINE HCL 100 MG/ML IJ SOLN
100.0000 mg | Freq: Once | INTRAMUSCULAR | Status: AC
Start: 2023-02-05 — End: 2023-02-05
  Administered 2023-02-05: 100 mg via INTRAVENOUS
  Filled 2023-02-05: qty 2

## 2023-02-05 NOTE — ED Notes (Signed)
Pt transported to MRI via stretcher per MRI tech

## 2023-02-05 NOTE — ED Provider Notes (Signed)
[facility] Provider Note    Event Date/Time   First MD Initiated Contact with Patient 02/05/23 2259     (approximate)   History   Chest Pain   HPI  Levi Mejia is a 52 y.o. male with history of hypertension, hyperlipidemia, seizures, stroke, alcohol abuse who presents to the emergency department complaints of chest discomfort that feels like a squeezing pain in the center of his chest without radiation that started at 2 PM today.  Has had some mild shortness of breath.  No nausea, vomiting, diaphoresis, dizziness.  No lower extremity swelling or pain.  Reports he has had multiple syncopal events.  Also complaining of left-sided numbness and weakness involving the face, arm and leg that started at 2 PM.  Initially denies drinking alcohol today but then does state he drinks every day.  No drug use.   History provided by patient.    Past Medical History:  Diagnosis Date   Alcohol abuse    Attention deficit disorder without mention of hyperactivity    HTN (hypertension)    Hyperlipidemia 07/03/2022   ICH (intracerebral hemorrhage) (HCC)    Seizures (HCC)    Stroke [facility])     Past Surgical History:  Procedure Laterality Date   ESOPHAGOGASTRODUODENOSCOPY (EGD) WITH PROPOFOL N/A 07/04/2022   Procedure: ESOPHAGOGASTRODUODENOSCOPY (EGD) WITH PROPOFOL;  Surgeon: Toney Reil, MD;  Location: [facility] ENDOSCOPY;  Service: Endoscopy;  Laterality: N/A;   Hand tendon reattachment  02/28/1990   Right hand   toe reattachment  02/28/1994   Right 1st toe    MEDICATIONS:  Prior to Admission medications   Medication Sig Start Date End Date Taking? Authorizing Provider  aspirin EC 81 MG tablet Take 1 tablet (81 mg total) by mouth daily. 04/08/22   Sunnie Nielsen, DO  diclofenac Sodium (VOLTAREN) 1 % GEL Apply 4 g topically 3 (three) times daily as needed. 08/01/22   Allegra Grana, FNP  feeding supplement (ENSURE ENLIVE / ENSURE PLUS) LIQD Take  237 mLs by mouth 3 (three) times daily between meals. 10/19/22   Pennie Banter, DO  folic acid (FOLVITE) 1 MG tablet Take 1 tablet (1 mg total) by mouth daily. 08/18/22   Dana Allan, MD  losartan (COZAAR) 25 MG tablet Take 0.5 tablets (12.5 mg total) by mouth daily. 08/01/22   Allegra Grana, FNP  Multiple Vitamin (MULTIVITAMIN WITH MINERALS) TABS tablet Take 1 tablet by mouth daily. 04/09/22   Sunnie Nielsen, DO  ondansetron (ZOFRAN-ODT) 4 MG disintegrating tablet Take 1 tablet (4 mg total) by mouth every 8 (eight) hours as needed for nausea or vomiting. 12/01/22   Chesley Noon, MD  pantoprazole (PROTONIX) 40 MG tablet Take 1 tablet (40 mg total) by mouth 2 (two) times daily before a meal. 10/06/22   Dana Allan, MD  potassium chloride SA (KLOR-CON M) 20 MEQ tablet Take 1 tablet (20 mEq total) by mouth 2 (two) times daily for 5 days. 12/01/22 12/06/22  Chesley Noon, MD  thiamine (VITAMIN B1) 100 MG tablet Take 100 mg by mouth daily. 07/04/22   [provider]    Physical Exam   Triage Vital Signs: ED Triage Vitals [02/05/23 2232]  Encounter Vitals Group     BP (!) 108/96     Systolic BP Percentile      Diastolic BP Percentile      Pulse Rate (!) 127     Resp 18     Temp 98 F (36.7 C)  Temp Source Oral     SpO2 95 %     Weight 165 lb (74.8 kg)     Height      Head Circumference      Peak Flow      Pain Score 7     Pain Loc      Pain Education      Exclude from Growth Chart     Most recent vital signs: Vitals:   02/05/23 2300 02/06/23 0104  BP: (!) 121/91 123/84  Pulse: 91 98  Resp:  18  Temp:  98.5 F (36.9 C)  SpO2:  98%    CONSTITUTIONAL: Alert, responds appropriately to questions.  Intoxicated. HEAD: Normocephalic, atraumatic EYES: Conjunctivae clear, pupils appear equal, sclera nonicteric ENT: normal nose; moist mucous membranes NECK: Supple, normal ROM CARD: Regular and tachycardic; S1 and S2 appreciated RESP: Normal chest excursion  without splinting or tachypnea; breath sounds clear and equal bilaterally; no wheezes, no rhonchi, no rales, no hypoxia or respiratory distress, speaking full sentences ABD/GI: Non-distended; soft, non-tender, no rebound, no guarding, no peritoneal signs BACK: The back appears normal EXT: Normal ROM in all joints; no deformity noted, no edema, no calf tenderness or calf swelling SKIN: Normal color for age and race; warm; no rash on exposed skin NEURO: Difficulty lifting the left leg off the bed, no pronator drift of the upper extremities, reports decreased sensation in the left face, arm and leg compared to the right.  No facial asymmetry.  Cranial nerves II through XII intact.  Normal speech.  No tremors. PSYCH: The patient's mood and manner are appropriate.   ED Results / Procedures / Treatments   LABS: (all labs ordered are listed, but only abnormal results are displayed) Labs Reviewed  CBC WITH DIFFERENTIAL/PLATELET - Abnormal; Notable for the following components:      Result Value   RBC 4.05 (*)    MCV 101.0 (*)    MCH 36.8 (*)    MCHC 36.4 (*)    Abs Immature Granulocytes 0.08 (*)    All other components within normal limits  COMPREHENSIVE METABOLIC PANEL - Abnormal; Notable for the following components:   Potassium 3.4 (*)    Chloride 94 (*)    Glucose, Bld 126 (*)    Calcium 8.5 (*)    AST 480 (*)    ALT 122 (*)    Alkaline Phosphatase 139 (*)    Total Bilirubin 2.4 (*)    Anion gap 16 (*)    All other components within normal limits  ETHANOL - Abnormal; Notable for the following components:   Alcohol, Ethyl (B) 313 (*)    All other components within normal limits  D-DIMER, QUANTITATIVE (NOT AT [facility]) - Abnormal; Notable for the following components:   D-Dimer, Quant 0.55 (*)    All other components within normal limits  MAGNESIUM  URINALYSIS, ROUTINE W REFLEX MICROSCOPIC  URINE DRUG SCREEN, QUALITATIVE ([facility] ONLY)  TROPONIN I (HIGH SENSITIVITY)  TROPONIN I (HIGH  SENSITIVITY)     EKG:  EKG Interpretation Date/Time:  Sunday February 05 2023 22:28:55 EST Ventricular Rate:  121 PR Interval:  128 QRS Duration:  70 QT Interval:  324 QTC Calculation: 460 R Axis:   76  Text Interpretation: Sinus tachycardia Otherwise normal ECG When compared with ECG of 10-Jan-2023 13:30, PREVIOUS ECG IS PRESENT Confirmed by Rochele Raring [phone]) on 02/05/2023 11:48:24 PM         RADIOLOGY: My personal review and interpretation of imaging: CTA  chest shows no PE.  MRI brain shows no stroke.  I have personally reviewed all radiology reports.   CT Angio Chest PE W and/or Wo Contrast  Result Date: 02/06/2023 CLINICAL DATA:  Pulmonary embolism (PE) suspected, low to intermediate prob, positive D-dimer. Chest pain, syncope EXAM: CT ANGIOGRAPHY CHEST WITH CONTRAST TECHNIQUE: Multidetector CT imaging of the chest was performed using the standard protocol during bolus administration of intravenous contrast. Multiplanar CT image reconstructions and MIPs were obtained to evaluate the vascular anatomy. RADIATION DOSE REDUCTION: This exam was performed according to the departmental dose-optimization program which includes automated exposure control, adjustment of the mA and/or kV according to patient size and/or use of iterative reconstruction technique. CONTRAST:  75mL OMNIPAQUE IOHEXOL 350 MG/ML SOLN COMPARISON:  None Available. FINDINGS: Cardiovascular: No filling defects in the pulmonary arteries to suggest pulmonary emboli. Heart is normal size. Aorta is normal caliber. Calcifications in the left anterior descending and right coronary arteries. Mediastinum/Nodes: No mediastinal, hilar, or axillary adenopathy. Trachea and esophagus are unremarkable. Thyroid unremarkable. Lungs/Pleura: Lungs are clear. No focal airspace opacities or suspicious nodules. No effusions. Upper Abdomen: Diffuse fatty infiltration of the liver. No acute findings. Musculoskeletal: Chest wall soft tissues  are unremarkable. No acute bony abnormality. Review of the MIP images confirms the above findings. IMPRESSION: No evidence of pulmonary embolus. Coronary artery disease. No acute cardiopulmonary disease. Hepatic steatosis. Electronically Signed   By: Charlett Nose M.D.   On: 02/06/2023 01:39   MR BRAIN WO CONTRAST  Result Date: 02/06/2023 CLINICAL DATA:  Syncopal episodes EXAM: MRI HEAD WITHOUT CONTRAST TECHNIQUE: Multiplanar, multiecho pulse sequences of the brain and surrounding structures were obtained without intravenous contrast. COMPARISON:  04/07/2022 MRI head, correlation is made with 02/05/2023 CT head FINDINGS: Brain: No restricted diffusion to suggest acute or subacute infarct. No acute hemorrhage, mass, mass effect, or midline shift. No hydrocephalus or extra-axial collection. Craniocervical junction within normal limits. Multiple foci of hemosiderin deposition in the bilateral cerebral hemispheres and basal ganglia, favored to represent sequela prior hypertensive microhemorrhages. Redemonstrated more focal area of hemosiderin deposition in the medial right occipital lobe sulcus (series 13, image 33). Mildly advanced cerebral volume loss for age. Multiple remote lacunar infarcts in the basal ganglia and corona radiata. Dilated perivascular spaces in the basal ganglia. Vascular: Normal arterial flow voids. Skull and upper cervical spine: Normal marrow signal. Sinuses/Orbits: Complete opacification of the right maxillary sinus. Additional mucosal thickening in the right ethmoid air cells. No acute finding in the orbits. Other: Trace fluid in left mastoid air cells. IMPRESSION: No acute intracranial process. No evidence of acute or subacute infarct. Electronically Signed   By: Wiliam Ke M.D.   On: 02/06/2023 00:50   CT HEAD WO CONTRAST ( )  Result Date: 02/05/2023 CLINICAL DATA:  Syncope EXAM: CT HEAD WITHOUT CONTRAST TECHNIQUE: Contiguous axial images were obtained from the base of the skull  through the vertex without intravenous contrast. RADIATION DOSE REDUCTION: This exam was performed according to the departmental dose-optimization program which includes automated exposure control, adjustment of the mA and/or kV according to patient size and/or use of iterative reconstruction technique. COMPARISON:  01/10/2023 FINDINGS: Brain: Chronic microvascular ischemic changes throughout the deep white matter. Multifocal chronic infarcts throughout the right corona radiata, unchanged. No acute intracranial abnormality. Specifically, no hemorrhage, hydrocephalus, mass lesion, acute infarction, or significant intracranial injury. Vascular: No hyperdense vessel or unexpected calcification. Skull: No acute calvarial abnormality. Sinuses/Orbits: No acute findings. Complete opacification of the right maxillary sinus, unchanged. Other: None  IMPRESSION: Chronic microvascular disease and chronic lacunar infarcts throughout the right corona radiata. No change since prior study. No acute intracranial abnormality. Electronically Signed   By: Charlett Nose M.D.   On: 02/05/2023 23:31     PROCEDURES:  Critical Care performed: No     .1-3 Lead EKG Interpretation  Performed by: Karizma Cheek, Layla Maw, DO Authorized by: Zenna Traister, Layla Maw, DO     Interpretation: abnormal     ECG rate:  127   ECG rate assessment: tachycardic     Rhythm: sinus tachycardia     Ectopy: none     Conduction: normal       IMPRESSION / MDM / ASSESSMENT AND PLAN / ED COURSE  I reviewed the triage vital signs and the nursing notes.    Patient here with chest pain, shortness of breath, syncope, left-sided weakness and numbness that started at 2 PM.  The patient is on the cardiac monitor to evaluate for evidence of arrhythmia and/or significant heart rate changes.   DIFFERENTIAL DIAGNOSIS (includes but not limited to):   ACS, PE, dissection, pneumonia, pneumothorax, alcohol abuse, malingering, CVA, intracranial  hemorrhage   Patient's presentation is most consistent with acute presentation with potential threat to life or bodily function.   PLAN: Patient's labs from triage show normal hemoglobin.  Potassium of 3.4.  Normal blood glucose.  Elevated liver function test which appear to be his baseline from substance abuse.  Alcohol level of 313.  Magnesium level 1.7.  CT head reviewed and interpreted by myself and the radiologist and shows no acute abnormality.  EKG shows sinus tachycardia.  Will obtain D-dimer, second troponin and MRI of the brain.  Will give IV fluids, thiamine, Protonix and Mylanta for symptomatic relief.  No sign of alcohol withdrawal currently.  Difficult to get a neuroexam from patient due to his intoxication but currently he is not LVO positive.  He is outside tPA window.   Reportedly had a brief syncopal event in triage.  No seizure-like activity or postictal state.  He does not appear to be withdrawing currently.  MEDICATIONS GIVEN IN ED: Medications  sodium chloride 0.9 % bolus 1,000 mL (0 mLs Intravenous Stopped 02/06/23 0114)  ondansetron (ZOFRAN) injection 4 mg (4 mg Intravenous Given 02/05/23 2352)  pantoprazole (PROTONIX) injection 40 mg (40 mg Intravenous Given 02/05/23 2352)  alum & mag hydroxide-simeth (MAALOX/MYLANTA) 200-200-20 MG/5ML suspension 30 mL (30 mLs Oral Given 02/06/23 0206)  thiamine (VITAMIN B1) injection 100 mg (100 mg Intravenous Given 02/05/23 2352)  potassium chloride 10 mEq in 100 mL IVPB (0 mEq Intravenous Stopped 02/06/23 0308)  magnesium sulfate IVPB 1 g 100 mL (0 g Intravenous Stopped 02/06/23 0309)  iohexol (OMNIPAQUE) 350 MG/ML injection 75 mL (75 mLs Intravenous Contrast Given 02/06/23 0126)     ED COURSE: Patient's D-dimer elevated.  Obtain CTA of the chest which showed no PE or other acute abnormality.  Second troponin pending.  MRI brain reviewed and interpreted by myself and the radiologist and shows no stroke.  4:00 AM Pt more awake.   Tolerating p.o.  Ambulates without difficulty.  No recurrent loss of consciousness and no seizure activity.  No signs of alcohol withdrawal currently.  Second troponin negative.  I feel he is safe to be discharged home.  He has had episodes of loss of consciousness before that however being followed as an outpatient.  Have encouraged him to follow-up with his PCP and neurologist as an outpatient.  He has been informed that he  is not to drive for 6 months after these episodes of passing out and will need close outpatient follow-up.  No events seen on cardiac monitoring today.   At this time, I do not feel there is any life-threatening condition present. I reviewed all nursing notes, vitals, pertinent previous records.  All lab and urine results, EKGs, imaging ordered have been independently reviewed and interpreted by myself.  I reviewed all available radiology reports from any imaging ordered this visit.  Based on my assessment, I feel the patient is safe to be discharged home without further emergent workup and can continue workup as an outpatient as needed. Discussed all findings, treatment plan as well as usual and customary return precautions.  They verbalize understanding and are comfortable with this plan.  Outpatient follow-up has been provided as needed.  All questions have been answered.  CONSULTS:  none   OUTSIDE RECORDS REVIEWED: Reviewed last neurology note in June 2024.  Patient seen at that time for episodes of loss of consciousness.  He has had a negative EEG and brain MRI in 2022.     FINAL CLINICAL IMPRESSION(S) / ED DIAGNOSES   Final diagnoses:  Nonspecific chest pain  Left sided numbness  Alcoholic intoxication without complication (HCC)  Loss of consciousness (HCC)     Rx / DC Orders   ED Discharge Orders     None        Note:  This document was prepared using Dragon voice recognition software and may include unintentional dictation errors.   Eddith Mentor,  Layla Maw, DO 02/06/23 [phone]

## 2023-02-05 NOTE — ED Notes (Signed)
Two golds and blue top sent to lab for hold

## 2023-02-05 NOTE — ED Triage Notes (Addendum)
Patient BIB [facility] c/o central chest pain and "randomly passing out" since 1400.  Patient had "syncopal" episodes multiple times in triage lasting arpprox 5-8 seconds, and would not be responsive to painful stimuli.  Patient also c/o left arm and leg weakness since 1400. Patient CAOx4 otherwise.  Patient does have history of CVA.

## 2023-02-05 NOTE — ED Notes (Signed)
Patient had syncopal episode before going to room which resulted in "jerking" activity, patient unresponsive, patient became alert shortly after, confused about where he was at.  No loss of bowel or bladder.

## 2023-02-06 ENCOUNTER — Emergency Department: Payer: Self-pay

## 2023-02-06 LAB — TROPONIN I (HIGH SENSITIVITY): Troponin I (High Sensitivity): 9 ng/L (ref <18)

## 2023-02-06 LAB — D-DIMER, QUANTITATIVE: D-Dimer, Quant: 0.55 ug{FEU}/mL — ABNORMAL HIGH (ref 0.00–0.50)

## 2023-02-06 MED ORDER — POTASSIUM CHLORIDE 10 MEQ/100ML IV SOLN
10.0000 meq | INTRAVENOUS | Status: AC
  Administered 2023-02-06 (×2): 10 meq via INTRAVENOUS
  Filled 2023-02-06: qty 100

## 2023-02-06 MED ORDER — MAGNESIUM SULFATE IN D5W 1-5 GM/100ML-% IV SOLN
1.0000 g | Freq: Once | INTRAVENOUS | Status: AC
  Administered 2023-02-06: 1 g via INTRAVENOUS
  Filled 2023-02-06: qty 100

## 2023-02-06 MED ORDER — IOHEXOL 350 MG/ML SOLN
75.0000 mL | Freq: Once | INTRAVENOUS | Status: AC | PRN
  Administered 2023-02-06: 75 mL via INTRAVENOUS

## 2023-02-06 NOTE — ED Notes (Signed)
Vitals signs checked, Pt requested "Lemon Lime soda". RN and MD notified.

## 2023-02-06 NOTE — Discharge Instructions (Signed)
Your cardiac enzymes today were normal.  CT of your chest showed no blood clot or pneumonia.  MRI of your brain showed no stroke.  I recommend close follow-up with your PCP, neurologist as an outpatient.

## 2023-02-07 ENCOUNTER — Ambulatory Visit: Payer: Self-pay | Admitting: Family Medicine

## 2023-02-08 ENCOUNTER — Other Ambulatory Visit: Payer: Self-pay

## 2023-02-08 ENCOUNTER — Emergency Department: Payer: Self-pay

## 2023-02-08 DIAGNOSIS — W1830XA Fall on same level, unspecified, initial encounter: Secondary | ICD-10-CM | POA: Insufficient documentation

## 2023-02-08 DIAGNOSIS — Z5321 Procedure and treatment not carried out due to patient leaving prior to being seen by health care provider: Secondary | ICD-10-CM | POA: Insufficient documentation

## 2023-02-08 DIAGNOSIS — R079 Chest pain, unspecified: Secondary | ICD-10-CM | POA: Insufficient documentation

## 2023-02-08 NOTE — ED Notes (Signed)
Pt refusing to have blood work drawn or EKG done at this time

## 2023-02-08 NOTE — ED Triage Notes (Signed)
Pt to ED via EMS from home, pt reports he drank 6 shots of vodka today and developed chest pain. Pt had a fall at home but no injuries from fall.

## 2023-02-09 ENCOUNTER — Emergency Department
Admission: EM | Admit: 2023-02-09 | Discharge: 2023-02-09 | Discharge status: Against Medical Advice | Payer: Self-pay | Attending: Emergency Medicine | Admitting: Emergency Medicine

## 2023-02-14 ENCOUNTER — Emergency Department
Admission: EM | Admit: 2023-02-14 | Discharge: 2023-02-14 | Discharge status: Against Medical Advice | Payer: Self-pay | Attending: Emergency Medicine | Admitting: Emergency Medicine

## 2023-02-14 ENCOUNTER — Other Ambulatory Visit: Payer: Self-pay

## 2023-02-14 DIAGNOSIS — F10239 Alcohol dependence with withdrawal, unspecified: Secondary | ICD-10-CM | POA: Insufficient documentation

## 2023-02-14 DIAGNOSIS — Z5321 Procedure and treatment not carried out due to patient leaving prior to being seen by health care provider: Secondary | ICD-10-CM | POA: Insufficient documentation

## 2023-02-14 DIAGNOSIS — Y908 Blood alcohol level of 240 mg/100 ml or more: Secondary | ICD-10-CM | POA: Insufficient documentation

## 2023-02-14 LAB — COMPREHENSIVE METABOLIC PANEL
ALT: 112 U/L — ABNORMAL HIGH (ref 0–44)
AST: 515 U/L — ABNORMAL HIGH (ref 15–41)
Albumin: 4 g/dL (ref 3.5–5.0)
Alkaline Phosphatase: 123 U/L (ref 38–126)
Anion gap: 17 — ABNORMAL HIGH (ref 5–15)
BUN: <5 mg/dL — ABNORMAL LOW (ref 6–20)
CO2: 24 mmol/L (ref 22–32)
Calcium: 8.7 mg/dL — ABNORMAL LOW (ref 8.9–10.3)
Chloride: 95 mmol/L — ABNORMAL LOW (ref 98–111)
Creatinine, Ser: 0.72 mg/dL (ref 0.61–1.24)
GFR, Estimated: >60 mL/min (ref >60)
Glucose, Bld: 117 mg/dL — ABNORMAL HIGH (ref 70–99)
Potassium: 3.1 mmol/L — ABNORMAL LOW (ref 3.5–5.1)
Sodium: 136 mmol/L (ref 135–145)
Total Bilirubin: 2.1 mg/dL — ABNORMAL HIGH (ref <1.2)
Total Protein: 8.1 g/dL (ref 6.5–8.1)

## 2023-02-14 LAB — CBC
HCT: 40.1 % (ref 39.0–52.0)
Hemoglobin: 14.4 g/dL (ref 13.0–17.0)
MCH: 37.2 pg — ABNORMAL HIGH (ref 26.0–34.0)
MCHC: 35.9 g/dL (ref 30.0–36.0)
MCV: 103.6 fL — ABNORMAL HIGH (ref 80.0–100.0)
Platelets: 163 10*3/uL (ref 150–400)
RBC: 3.87 MIL/uL — ABNORMAL LOW (ref 4.22–5.81)
RDW: 12.3 % (ref 11.5–15.5)
WBC: 6.2 10*3/uL (ref 4.0–10.5)
nRBC: 0 % (ref 0.0–0.2)

## 2023-02-14 LAB — ETHANOL: Alcohol, Ethyl (B): 376 mg/dL (ref <10)

## 2023-02-14 NOTE — ED Triage Notes (Signed)
Pt comes with c/o needing detox. Pt went to [facility] but alcohol was .4. Pt sent here for detox and then they have a spot for him.  Pt states last drink this morning., pt states it was vodka.

## 2023-02-14 NOTE — ED Notes (Signed)
Received call from TTS that [facility] is on way back to get this pt and take him with them.

## 2023-04-25 ENCOUNTER — Emergency Department
Admission: EM | Admit: 2023-04-25 | Discharge: 2023-04-25 | Discharge status: Against Medical Advice | Payer: Self-pay | Attending: Emergency Medicine | Admitting: Emergency Medicine

## 2023-04-25 ENCOUNTER — Emergency Department: Payer: Self-pay

## 2023-04-25 ENCOUNTER — Other Ambulatory Visit: Payer: Self-pay

## 2023-04-25 DIAGNOSIS — F10120 Alcohol abuse with intoxication, uncomplicated: Secondary | ICD-10-CM | POA: Insufficient documentation

## 2023-04-25 DIAGNOSIS — I10 Essential (primary) hypertension: Secondary | ICD-10-CM | POA: Insufficient documentation

## 2023-04-25 DIAGNOSIS — F10920 Alcohol use, unspecified with intoxication, uncomplicated: Secondary | ICD-10-CM

## 2023-04-25 DIAGNOSIS — R200 Anesthesia of skin: Secondary | ICD-10-CM | POA: Insufficient documentation

## 2023-04-25 LAB — COMPREHENSIVE METABOLIC PANEL
ALT: 52 U/L — ABNORMAL HIGH (ref 0–44)
AST: 194 U/L — ABNORMAL HIGH (ref 15–41)
Albumin: 3.7 g/dL (ref 3.5–5.0)
Alkaline Phosphatase: 84 U/L (ref 38–126)
Anion gap: 13 (ref 5–15)
BUN: <5 mg/dL — ABNORMAL LOW (ref 6–20)
CO2: 27 mmol/L (ref 22–32)
Calcium: 8.5 mg/dL — ABNORMAL LOW (ref 8.9–10.3)
Chloride: 97 mmol/L — ABNORMAL LOW (ref 98–111)
Creatinine, Ser: 0.76 mg/dL (ref 0.61–1.24)
GFR, Estimated: >60 mL/min (ref >60)
Glucose, Bld: 104 mg/dL — ABNORMAL HIGH (ref 70–99)
Potassium: 3.3 mmol/L — ABNORMAL LOW (ref 3.5–5.1)
Sodium: 137 mmol/L (ref 135–145)
Total Bilirubin: 1.2 mg/dL (ref 0.0–1.2)
Total Protein: 7.8 g/dL (ref 6.5–8.1)

## 2023-04-25 LAB — CBC WITH DIFFERENTIAL/PLATELET
Abs Immature Granulocytes: 0.02 10*3/uL (ref 0.00–0.07)
Basophils Absolute: 0.1 10*3/uL (ref 0.0–0.1)
Basophils Relative: 2 %
Eosinophils Absolute: 0.1 10*3/uL (ref 0.0–0.5)
Eosinophils Relative: 2 %
HCT: 43.8 % (ref 39.0–52.0)
Hemoglobin: 15.4 g/dL (ref 13.0–17.0)
Immature Granulocytes: 0 %
Lymphocytes Relative: 29 %
Lymphs Abs: 1.9 10*3/uL (ref 0.7–4.0)
MCH: 34.9 pg — ABNORMAL HIGH (ref 26.0–34.0)
MCHC: 35.2 g/dL (ref 30.0–36.0)
MCV: 99.3 fL (ref 80.0–100.0)
Monocytes Absolute: 0.6 10*3/uL (ref 0.1–1.0)
Monocytes Relative: 9 %
Neutro Abs: 3.9 10*3/uL (ref 1.7–7.7)
Neutrophils Relative %: 58 %
Platelets: 158 10*3/uL (ref 150–400)
RBC: 4.41 MIL/uL (ref 4.22–5.81)
RDW: 12.4 % (ref 11.5–15.5)
WBC: 6.7 10*3/uL (ref 4.0–10.5)
nRBC: 0 % (ref 0.0–0.2)

## 2023-04-25 LAB — PROTIME-INR
INR: 1 (ref 0.8–1.2)
Prothrombin Time: 13.7 s (ref 11.4–15.2)

## 2023-04-25 LAB — ETHANOL: Alcohol, Ethyl (B): 405 mg/dL (ref <10)

## 2023-04-25 LAB — TROPONIN I (HIGH SENSITIVITY): Troponin I (High Sensitivity): 6 ng/L (ref <18)

## 2023-04-25 LAB — LIPASE, BLOOD: Lipase: 33 U/L (ref 11–51)

## 2023-04-25 LAB — MAGNESIUM: Magnesium: 1.8 mg/dL (ref 1.7–2.4)

## 2023-04-25 LAB — APTT: aPTT: 30 s (ref 24–36)

## 2023-04-25 MED ORDER — SODIUM CHLORIDE 0.9 % IV BOLUS
1000.0000 mL | Freq: Once | INTRAVENOUS | Status: AC
  Administered 2023-04-25: 1000 mL via INTRAVENOUS

## 2023-04-25 NOTE — ED Provider Notes (Signed)
 [facility] Provider Note    Event Date/Time   First MD Initiated Contact with Patient 04/25/23 [phone]     (approximate)   History   Numbness   HPI Levi Mejia is a 53 y.o. male with history of HTN, chronic alcohol abuse, seizures presenting today for numbness and nausea.  Patient states he has had intermittent nausea and vomiting x 2 days.  Approximately 1.5 days ago he had onset of numbness to his left arm.  It has been constant since then.  He notes slight weakness to his left leg.  Otherwise denies chest pain, shortness of breath, abdominal pain.  Notes chronic diarrhea but no change in that.  No other constipation or dysuria symptoms.  No vision or speech changes.  Denies prior history of CVA.  Does state he drinks approximately half 1/5/day with last drink 2 days ago.     Physical Exam   Triage Vital Signs: ED Triage Vitals  Encounter Vitals Group     BP 04/25/23 0927 (!) 158/94     Systolic BP Percentile --      Diastolic BP Percentile --      Pulse Rate 04/25/23 0923 79     Resp 04/25/23 0923 16     Temp 04/25/23 0923 97.9 F (36.6 C)     Temp src --      SpO2 04/25/23 0923 100 %     Weight 04/25/23 0924 162 lb 8 oz (73.7 kg)     Height --      Head Circumference --      Peak Flow --      Pain Score 04/25/23 0923 3     Pain Loc --      Pain Education --      Exclude from Growth Chart --     Most recent vital signs: Vitals:   04/25/23 0927 04/25/23 1000  BP: (!) 158/94 (!) 140/94  Pulse:  80  Resp:  19  Temp:    SpO2:  94%   Physical Exam: I have reviewed the vital signs and nursing notes. General: Awake, alert, no acute distress.  Nontoxic appearing. Head:  Atraumatic, normocephalic.   ENT:  EOM intact, PERRL. Oral mucosa is pink and moist with no lesions. Neck: Neck is supple with full range of motion, No meningeal signs. Cardiovascular:  RRR, No murmurs. Peripheral pulses palpable and equal  bilaterally. Respiratory:  Symmetrical chest wall expansion.  No rhonchi, rales, or wheezes.  Good air movement throughout.  No use of accessory muscles.   Musculoskeletal:  No cyanosis or edema. Moving extremities with full ROM Abdomen:  Soft, nontender, nondistended. Neuro:  GCS 15, moving all four extremities, interacting appropriately. Speech clear.  Perceived left lower face numbness in comparison to the rest of his face.  Decree sensation to left upper extremity in comparison to right side.  No sensation changes to bilateral lower extremities.  No drift to bilateral upper or lower extremities with strength otherwise intact.  Outside of sensation change to left side of his face, cranial nerves II through XII otherwise intact. Psych:  Calm, appropriate.   Skin:  Warm, dry, no rash.    ED Results / Procedures / Treatments   Labs (all labs ordered are listed, but only abnormal results are displayed) Labs Reviewed  ETHANOL - Abnormal; Notable for the following components:      Result Value   Alcohol, Ethyl (B) 405 (*)    All other components  within normal limits  CBC WITH DIFFERENTIAL/PLATELET - Abnormal; Notable for the following components:   MCH 34.9 (*)    All other components within normal limits  COMPREHENSIVE METABOLIC PANEL - Abnormal; Notable for the following components:   Potassium 3.3 (*)    Chloride 97 (*)    Glucose, Bld 104 (*)    BUN <5 (*)    Calcium 8.5 (*)    AST 194 (*)    ALT 52 (*)    All other components within normal limits  PROTIME-INR  APTT  LIPASE, BLOOD  MAGNESIUM  CBC WITH DIFFERENTIAL/PLATELET  TROPONIN I (HIGH SENSITIVITY)  TROPONIN I (HIGH SENSITIVITY)     EKG My EKG interpretation: Rate of 77, normal sinus rhythm, normal axis, normal intervals.  No acute ST elevations or depressions   RADIOLOGY Independently interpreted CT head with no acute pathology   PROCEDURES:  Critical Care performed: No  Procedures   MEDICATIONS ORDERED  IN ED: Medications  sodium chloride 0.9 % bolus 1,000 mL (1,000 mLs Intravenous New Bag/Given 04/25/23 0934)     IMPRESSION / MDM / ASSESSMENT AND PLAN / ED COURSE  I reviewed the triage vital signs and the nursing notes.                              Differential diagnosis includes, but is not limited to, CVA, electrolyte abnormality, alcohol withdrawal, viral gastroenteritis, alcohol intoxication  Patient's presentation is most consistent with acute complicated illness / injury requiring diagnostic workup.  Patient is a 53 year old male presenting today for reported numbness to the left upper arm going on for 1.5 days and associated nausea.  Otherwise he is at his baseline.  He has perceived sensation deficits but no other acute neurological findings on exam at this time.  Vital signs otherwise stable.  CT head with no acute pathology.  Patient outside any window for tPA or thrombectomy.  Laboratory workup most notable for ethanol level of 405.  Upon discussing this again with patient, he now admits to heavy alcohol use today.  Laboratory workup otherwise reassuring.  Patient otherwise asymptomatic.  He denies any ongoing numbness or weakness anywhere.  I stated that I cannot fully rule out a stroke without MRI although I do have lower suspicion now given his acute intoxication.  Patient otherwise had full capacity and was able to ambulate in the ED and understand risks and benefits of leaving.  Patient wished to leave AGAINST MEDICAL ADVICE before fully ruling out any additional acute pathology.  Given strict return precautions and told to follow-up with his PCP as well as limit alcohol use.  I do suspect most likely that his presentation today was related to his chronic alcohol abuse.  The patient is on the cardiac monitor to evaluate for evidence of arrhythmia and/or significant heart rate changes. Clinical Course as of 04/25/23 1058  Tue Apr 25, 2023  1610 Numbness symptoms present for  greater than 24 hours. Outside any TPA or thrombectomy window. [DW]  1015 Alcohol, Ethyl (B)(!!): 405 [DW]  1055 Patient reassessed and denies any numbness or weakness symptoms at this time.  Would like to leave and is going to set himself up for a ride.  Patient has full capacity and is clinically sober at this time. [DW]    Clinical Course User Index [DW] Janith Lima, MD     FINAL CLINICAL IMPRESSION(S) / ED DIAGNOSES   Final diagnoses:  Alcoholic intoxication without complication (HCC)     Rx / DC Orders   ED Discharge Orders     None        Note:  This document was prepared using Dragon voice recognition software and may include unintentional dictation errors.   Janith Lima, MD 04/25/23 1100

## 2023-04-25 NOTE — Discharge Instructions (Signed)
 Please limit your alcohol use as this may be the source of your symptoms.  Please follow-up with your primary care provider for ongoing outpatient assistance.

## 2023-04-25 NOTE — ED Notes (Signed)
 Sig pad not working. Pt verbalizes signing out AMA. Steady gait NAD noted.

## 2023-04-25 NOTE — ED Triage Notes (Addendum)
 Pt to ED [facility] for left arm numbness noticed yesterday am. N/v started yesterday am. Alert and oriented. Reports intermittent lower back pain. Denies chest pain. Daily ETOH, last ETOH 2 days ago  324 asa, 4 mg zofran, 0.4 nitroglycerin PTA

## 2023-06-25 ENCOUNTER — Emergency Department: Payer: Self-pay

## 2023-06-25 ENCOUNTER — Other Ambulatory Visit: Payer: Self-pay

## 2023-06-25 ENCOUNTER — Observation Stay: Payer: Self-pay

## 2023-06-25 ENCOUNTER — Observation Stay
Admission: EM | Admit: 2023-06-25 | Discharge: 2023-06-26 | Disposition: A | Discharge status: Home / Self Care | Payer: Self-pay | Attending: Emergency Medicine | Admitting: Emergency Medicine

## 2023-06-25 DIAGNOSIS — E876 Hypokalemia: Secondary | ICD-10-CM | POA: Diagnosis present

## 2023-06-25 DIAGNOSIS — I10 Essential (primary) hypertension: Secondary | ICD-10-CM | POA: Diagnosis present

## 2023-06-25 DIAGNOSIS — M549 Dorsalgia, unspecified: Secondary | ICD-10-CM | POA: Diagnosis present

## 2023-06-25 DIAGNOSIS — F1020 Alcohol dependence, uncomplicated: Secondary | ICD-10-CM | POA: Diagnosis present

## 2023-06-25 DIAGNOSIS — W108XXA Fall (on) (from) other stairs and steps, initial encounter: Secondary | ICD-10-CM

## 2023-06-25 DIAGNOSIS — F1010 Alcohol abuse, uncomplicated: Secondary | ICD-10-CM | POA: Insufficient documentation

## 2023-06-25 DIAGNOSIS — W19XXXA Unspecified fall, initial encounter: Principal | ICD-10-CM

## 2023-06-25 DIAGNOSIS — R7401 Elevation of levels of liver transaminase levels: Secondary | ICD-10-CM | POA: Insufficient documentation

## 2023-06-25 DIAGNOSIS — R7989 Other specified abnormal findings of blood chemistry: Secondary | ICD-10-CM | POA: Diagnosis present

## 2023-06-25 DIAGNOSIS — S300XXA Contusion of lower back and pelvis, initial encounter: Secondary | ICD-10-CM | POA: Insufficient documentation

## 2023-06-25 LAB — URINALYSIS, ROUTINE W REFLEX MICROSCOPIC
Bilirubin Urine: NEGATIVE
Glucose, UA: NEGATIVE mg/dL
Hgb urine dipstick: NEGATIVE
Ketones, ur: NEGATIVE mg/dL
Leukocytes,Ua: NEGATIVE
Nitrite: NEGATIVE
Protein, ur: NEGATIVE mg/dL
Specific Gravity, Urine: >1.046 — ABNORMAL HIGH (ref 1.005–1.030)
pH: 6 (ref 5.0–8.0)

## 2023-06-25 LAB — CBC WITH DIFFERENTIAL/PLATELET
Abs Immature Granulocytes: 0.03 10*3/uL (ref 0.00–0.07)
Basophils Absolute: 0.2 10*3/uL — ABNORMAL HIGH (ref 0.0–0.1)
Basophils Relative: 2 %
Eosinophils Absolute: 0.1 10*3/uL (ref 0.0–0.5)
Eosinophils Relative: 1 %
HCT: 41 % (ref 39.0–52.0)
Hemoglobin: 14.1 g/dL (ref 13.0–17.0)
Immature Granulocytes: 0 %
Lymphocytes Relative: 24 %
Lymphs Abs: 2.3 10*3/uL (ref 0.7–4.0)
MCH: 37 pg — ABNORMAL HIGH (ref 26.0–34.0)
MCHC: 34.4 g/dL (ref 30.0–36.0)
MCV: 107.6 fL — ABNORMAL HIGH (ref 80.0–100.0)
Monocytes Absolute: 0.6 10*3/uL (ref 0.1–1.0)
Monocytes Relative: 7 %
Neutro Abs: 6.1 10*3/uL (ref 1.7–7.7)
Neutrophils Relative %: 66 %
Platelets: 306 10*3/uL (ref 150–400)
RBC: 3.81 MIL/uL — ABNORMAL LOW (ref 4.22–5.81)
RDW: 12.1 % (ref 11.5–15.5)
WBC: 9.3 10*3/uL (ref 4.0–10.5)
nRBC: 0 % (ref 0.0–0.2)

## 2023-06-25 LAB — COMPREHENSIVE METABOLIC PANEL WITH GFR
ALT: 55 U/L — ABNORMAL HIGH (ref 0–44)
AST: 211 U/L — ABNORMAL HIGH (ref 15–41)
Albumin: 3.2 g/dL — ABNORMAL LOW (ref 3.5–5.0)
Alkaline Phosphatase: 125 U/L (ref 38–126)
Anion gap: 9 (ref 5–15)
BUN: <5 mg/dL — ABNORMAL LOW (ref 6–20)
CO2: 29 mmol/L (ref 22–32)
Calcium: 7.9 mg/dL — ABNORMAL LOW (ref 8.9–10.3)
Chloride: 103 mmol/L (ref 98–111)
Creatinine, Ser: 0.69 mg/dL (ref 0.61–1.24)
GFR, Estimated: >60 mL/min (ref >60)
Glucose, Bld: 116 mg/dL — ABNORMAL HIGH (ref 70–99)
Potassium: 3.1 mmol/L — ABNORMAL LOW (ref 3.5–5.1)
Sodium: 141 mmol/L (ref 135–145)
Total Bilirubin: 1.3 mg/dL — ABNORMAL HIGH (ref 0.0–1.2)
Total Protein: 7.2 g/dL (ref 6.5–8.1)

## 2023-06-25 LAB — TROPONIN I (HIGH SENSITIVITY): Troponin I (High Sensitivity): 6 ng/L (ref <18)

## 2023-06-25 LAB — SALICYLATE LEVEL: Salicylate Lvl: <7 mg/dL — ABNORMAL LOW (ref 7.0–30.0)

## 2023-06-25 LAB — CK: Total CK: 184 U/L (ref 49–397)

## 2023-06-25 LAB — ACETAMINOPHEN LEVEL: Acetaminophen (Tylenol), Serum: <10 ug/mL — ABNORMAL LOW (ref 10–30)

## 2023-06-25 MED ORDER — ONDANSETRON HCL 4 MG/2ML IJ SOLN: 4.0000 mg | Freq: Four times a day (QID) | INTRAMUSCULAR | Status: DC | PRN

## 2023-06-25 MED ORDER — FOLIC ACID 1 MG PO TABS: 1.0000 mg | ORAL_TABLET | Freq: Every day | ORAL | Status: DC

## 2023-06-25 MED ORDER — ACETAMINOPHEN 325 MG RE SUPP: 650.0000 mg | Freq: Four times a day (QID) | RECTAL | Status: DC | PRN

## 2023-06-25 MED ORDER — THIAMINE MONONITRATE 100 MG PO TABS: 100.0000 mg | ORAL_TABLET | Freq: Every day | ORAL | Status: DC

## 2023-06-25 MED ORDER — ENOXAPARIN SODIUM 40 MG/0.4ML IJ SOSY: 40.0000 mg | PREFILLED_SYRINGE | INTRAMUSCULAR | Status: DC

## 2023-06-25 MED ORDER — ACETAMINOPHEN 325 MG PO TABS: 650.0000 mg | ORAL_TABLET | Freq: Four times a day (QID) | ORAL | Status: DC | PRN

## 2023-06-25 MED ORDER — THIAMINE HCL 100 MG/ML IJ SOLN: 100.0000 mg | Freq: Every day | INTRAMUSCULAR | Status: DC

## 2023-06-25 MED ORDER — POTASSIUM CHLORIDE CRYS ER 20 MEQ PO TBCR: 40.0000 meq | EXTENDED_RELEASE_TABLET | Freq: Once | ORAL | Status: DC

## 2023-06-25 MED ORDER — LORAZEPAM 2 MG PO TABS
0.0000 mg | ORAL_TABLET | Freq: Four times a day (QID) | ORAL | Status: DC
  Administered 2023-06-26: 2 mg via ORAL
  Filled 2023-06-25: qty 1

## 2023-06-25 MED ORDER — KETOROLAC TROMETHAMINE 30 MG/ML IJ SOLN: 30.0000 mg | Freq: Three times a day (TID) | INTRAMUSCULAR | Status: DC | PRN

## 2023-06-25 MED ORDER — LORAZEPAM 2 MG/ML IJ SOLN: 0.0000 mg | Freq: Four times a day (QID) | INTRAMUSCULAR | Status: DC

## 2023-06-25 MED ORDER — IOHEXOL 300 MG/ML  SOLN
100.0000 mL | Freq: Once | INTRAMUSCULAR | Status: AC | PRN
  Administered 2023-06-25: 100 mL via INTRAVENOUS

## 2023-06-25 MED ORDER — LORAZEPAM 2 MG/ML IJ SOLN: 0.0000 mg | Freq: Two times a day (BID) | INTRAMUSCULAR | Status: DC

## 2023-06-25 MED ORDER — GABAPENTIN 100 MG PO CAPS: 200.0000 mg | ORAL_CAPSULE | Freq: Three times a day (TID) | ORAL | Status: DC

## 2023-06-25 MED ORDER — ONDANSETRON HCL 4 MG/2ML IJ SOLN
4.0000 mg | Freq: Once | INTRAMUSCULAR | Status: AC
  Administered 2023-06-25: 4 mg via INTRAVENOUS
  Filled 2023-06-25: qty 2

## 2023-06-25 MED ORDER — LIDOCAINE 5 % EX PTCH
1.0000 | MEDICATED_PATCH | CUTANEOUS | Status: DC
  Administered 2023-06-25: 1 via TRANSDERMAL
  Filled 2023-06-25: qty 1

## 2023-06-25 MED ORDER — PANTOPRAZOLE SODIUM 40 MG PO TBEC
40.0000 mg | DELAYED_RELEASE_TABLET | Freq: Two times a day (BID) | ORAL | Status: DC
Start: 2023-06-26 — End: 2023-06-25

## 2023-06-25 MED ORDER — DEXAMETHASONE SODIUM PHOSPHATE 10 MG/ML IJ SOLN
10.0000 mg | Freq: Once | INTRAMUSCULAR | Status: AC
  Administered 2023-06-25: 10 mg via INTRAVENOUS
  Filled 2023-06-25: qty 1

## 2023-06-25 MED ORDER — ADULT MULTIVITAMIN W/MINERALS CH: 1.0000 | ORAL_TABLET | Freq: Every day | ORAL | Status: DC

## 2023-06-25 MED ORDER — LORAZEPAM 2 MG/ML IJ SOLN: 1.0000 mg | INTRAMUSCULAR | Status: DC | PRN

## 2023-06-25 MED ORDER — CYCLOBENZAPRINE HCL 5 MG PO TABS: 7.5000 mg | ORAL_TABLET | Freq: Three times a day (TID) | ORAL | Status: DC | PRN

## 2023-06-25 MED ORDER — THIAMINE MONONITRATE 100 MG PO TABS
100.0000 mg | ORAL_TABLET | Freq: Every day | ORAL | Status: DC
  Administered 2023-06-26: 100 mg via ORAL
  Filled 2023-06-25: qty 1

## 2023-06-25 MED ORDER — FENTANYL CITRATE PF 50 MCG/ML IJ SOSY
50.0000 ug | PREFILLED_SYRINGE | Freq: Once | INTRAMUSCULAR | Status: AC
  Administered 2023-06-25: 50 ug via INTRAVENOUS
  Filled 2023-06-25: qty 1

## 2023-06-25 MED ORDER — LORAZEPAM 2 MG PO TABS: 0.0000 mg | ORAL_TABLET | Freq: Two times a day (BID) | ORAL | Status: DC

## 2023-06-25 MED ORDER — HYDROMORPHONE HCL 1 MG/ML IJ SOLN: 1.0000 mg | INTRAMUSCULAR | Status: DC | PRN

## 2023-06-25 MED ORDER — LOSARTAN POTASSIUM 25 MG PO TABS: 12.5000 mg | ORAL_TABLET | Freq: Every day | ORAL | Status: DC

## 2023-06-25 MED ORDER — DEXAMETHASONE SODIUM PHOSPHATE 4 MG/ML IJ SOLN: 4.0000 mg | Freq: Three times a day (TID) | INTRAMUSCULAR | Status: DC

## 2023-06-25 MED ORDER — KETOROLAC TROMETHAMINE 15 MG/ML IJ SOLN
15.0000 mg | Freq: Once | INTRAMUSCULAR | Status: AC
Start: 2023-06-25 — End: 2023-06-25
  Administered 2023-06-25: 15 mg via INTRAVENOUS
  Filled 2023-06-25: qty 1

## 2023-06-25 MED ORDER — ONDANSETRON HCL 4 MG PO TABS: 4.0000 mg | ORAL_TABLET | Freq: Four times a day (QID) | ORAL | Status: DC | PRN

## 2023-06-25 MED ORDER — LORAZEPAM 1 MG PO TABS: 1.0000 mg | ORAL_TABLET | ORAL | Status: DC | PRN

## 2023-06-25 NOTE — Assessment & Plan Note (Signed)
 CIWA withdrawal protocol Counseled on abstinence TOC consult for resources on detox

## 2023-06-25 NOTE — ED Notes (Signed)
 Pt was given water for hydration and pillow for comfort. Pt was informed in regards referral to outpatient treatment for substance abuse.

## 2023-06-25 NOTE — Assessment & Plan Note (Signed)
BP controlled ?- Continue losartan ?

## 2023-06-25 NOTE — [facility] Counselor (Signed)
 Referral packet faxed to [facility].

## 2023-06-25 NOTE — Assessment & Plan Note (Addendum)
 Fall down flight of 12 steps Initial pan CT scan negative for acute trauma but showing chronic multilevel degeneration more prominent on left Continue Decadron,  Continue multimodal pain management with NSAIDs, muscle relaxants, gabapentin, lidocaine  patch PT OT consult Follow MRI

## 2023-06-25 NOTE — ED Provider Notes (Addendum)
 [facility] Provider Note    Event Date/Time   First MD Initiated Contact with Patient 06/25/23 1614     (approximate)   History   Fall (12 stairs)   HPI  Levi Mejia is a 53 y.o. male who comes in with wife dropped him off..  Patient was intoxicated and fell down 12 stairs does not remember the fall.  Patient reports he last drank last night.  He does report drinking daily and has a history of alcohol withdrawals.  He does report having history of seizures with withdrawing however he states that he is not tempted to come off alcohol for some time now.  He states that he fell down 12 stairs according to his wife last night reports severe pain in his lower back area which makes it more difficult to ambulate.  He denies any attempts to try to harm himself.  Denies SI.  He states that he is interested in trying to stop drinking alcohol.  Physical Exam   Triage Vital Signs: ED Triage Vitals [06/25/23 1559]  Encounter Vitals Group     BP 103/77     Systolic BP Percentile      Diastolic BP Percentile      Pulse Rate 82     Resp 20     Temp 98.7 F (37.1 C)     Temp Source Oral     SpO2 99 %     Weight 150 lb (68 kg)     Height 5\' 10"  (1.778 m)     Head Circumference      Peak Flow      Pain Score 10     Pain Loc      Pain Education      Exclude from Growth Chart     Most recent vital signs: Vitals:   06/25/23 1559  BP: 103/77  Pulse: 82  Resp: 20  Temp: 98.7 F (37.1 C)  SpO2: 99%     General: Awake, no distress.  CV:  Good peripheral perfusion.  Resp:  Normal effort.  Abd:  No distention.  Other:  Patient able to move all extremities.  He has tenderness in the lower back. Bruising on the left flank  ED Results / Procedures / Treatments   Labs (all labs ordered are listed, but only abnormal results are displayed) Labs Reviewed  CBC WITH DIFFERENTIAL/PLATELET - Abnormal; Notable for the following components:      Result  Value   RBC 3.81 (*)    MCV 107.6 (*)    MCH 37.0 (*)    Basophils Absolute 0.2 (*)    All other components within normal limits  COMPREHENSIVE METABOLIC PANEL WITH GFR  CK  URINALYSIS, ROUTINE W REFLEX MICROSCOPIC  SALICYLATE LEVEL  ACETAMINOPHEN  LEVEL  TROPONIN I (HIGH SENSITIVITY)     EKG  My interpretation of EKG:  Normal sinus rate of 68 without any ST elevation or T wave inversions, normal intervals  RADIOLOGY I have reviewed the ct personally and interpreted and no ICH    PROCEDURES:  Critical Care performed: No  .1-3 Lead EKG Interpretation  Performed by: Lubertha Rush, MD Authorized by: Lubertha Rush, MD     ECG rate:  60   ECG rate assessment: normal     Rhythm: sinus rhythm     Ectopy: none     Conduction: normal      MEDICATIONS ORDERED IN ED: Medications  lidocaine  (LIDODERM ) 5 % 1  patch (has no administration in time range)  fentaNYL (SUBLIMAZE) injection 50 mcg (has no administration in time range)  ondansetron  (ZOFRAN ) injection 4 mg (has no administration in time range)     IMPRESSION / MDM / ASSESSMENT AND PLAN / ED COURSE  I reviewed the triage vital signs and the nursing notes.   Patient's presentation is most consistent with acute presentation with potential threat to life or bodily function.   Patient comes in with fall secondary to alcohol use will get labs to evaluate for Electra abnormalities, AKI, rhabdo given unclear downtime.  Will get pan CT imaging given concern for alcohol on board although at this time patient does not appear intoxicated and reports last drink was yesterday.  Patient was given some IV fentanyl lidocaine  patch to try to help with pain.  Tylenol  levels negative CBC reassuring CMP shows slightly low potassium LFTs slightly elevated similar to prior troponins are negative CK negative urine without evidence of UTI  On repeat evaluation after multiple pain medications patient is unable to stand up.  He is unable to  lift his left leg up off the bed.  He reports severe pain in his back.  His CT imaging does not show any obvious acute pathology but he does have some noted disc bulge that is more prominent on the left side so maybe he is experiencing some sciatic issues here.  Given his inability to ambulate I will discuss with the hospital team for admission.  Will order MRI spine as well as patient is requesting detox for alcohol abuse.  11:18 PM after patient was admitted to the hospitalist he states that now he can walk and reports resolution of symptoms.  He declines getting the MRI and does not want to be admitted to the hospital.  We had called his wife however who refuses to come pick patient up stating that he needs to go to detox and recommended be calling the [facility] since they have a bed available.  I have alerted TTS of this and patient will need to be either discharged when sober tomorrow or go over to the [facility] when bed is available.  11:36 PM I reevaluated patient and he continues to deny any need for MRI he denies any weakness, numbness in his leg.  He reports resolution of symptoms.  He understands that if by tomorrow morning they do not have a place for him to go that he would need to be discharged when sober.  He expressed understanding.  The patient is on the cardiac monitor to evaluate for evidence of arrhythmia and/or significant heart rate changes.      FINAL CLINICAL IMPRESSION(S) / ED DIAGNOSES   Final diagnoses:  Fall, initial encounter  Alcohol abuse     Rx / DC Orders   ED Discharge Orders     None        Note:  This document was prepared using Dragon voice recognition software and may include unintentional dictation errors.   Lubertha Rush, MD 06/25/23 2123    Lubertha Rush, MD 06/25/23 5409    Lubertha Rush, MD 06/25/23 [phone]

## 2023-06-25 NOTE — Plan of Care (Addendum)
  Cancelled Admission due to patient  request to be discharged from the ED   HPI: Levi Mejia is a 53 y.o. male with medical history significant for Severe alcohol use disorder with history of withdrawal seizures, prior admissions with request for detox, liver steatosis with elevated LFTs, HTN, being admitted for acute back pain with inability to ambulate after falling down a flight of 12 stairs the night prior while intoxicated.  He voices interest in detox. ED course and data review: Vitals within normal limits Labs notable for AST/ALT 211/55 and total bili 1.3 Potassium 3.1 Other labs including CBC with differential, CMP, troponin, CK, salicylate and acetaminophen  levels, UA for the most part unremarkable EtOH level pending  Patient was pan scanned with CT head, CT C, L, T-spine and CT abdomen and pelvis with contrast Notable findings on imaging: No acute or traumatic finding but showing chronic multilevel lumbar disc degeneration, more prominent on the left  Patient was treated with Toradol, fentanyl and D Decadron and a lidocaine  patch was placed however he continued to have intractable pain and difficulty raising his left leg.  He was unable to ambulate for this reason.  MRI ordered from ED, pending  Hospitalist consulted for admission.    Anticipated plan for patient was as stated below, however he improved while still in the ED and requested to be discharged -discussed with ED provider, Dr. Peggi Bowels who will take care of discharging patient  * Intractable back pain, acute on chronic Fall down flight of 12 steps Initial pan CT scan negative for acute trauma but showing chronic multilevel degeneration more prominent on left Continue Decadron,  Continue multimodal pain management with NSAIDs, muscle relaxants, gabapentin, lidocaine  patch PT OT consult Follow MRI  Alcohol use disorder, severe, dependence (HCC) CIWA withdrawal protocol Counseled on abstinence TOC consult  for resources on detox  Abnormal LFTs Secondary to alcohol Continue to monitor  Essential hypertension, benign BP controlled.  Continue losartan   Hypokalemia Oral repletion with K-Dur 40 mill equivalents

## 2023-06-25 NOTE — H&P (Incomplete)
 History and Physical    Patient: Levi Mejia WGN:[id number] DOB: 1970/10/24 DOA: 06/25/2023 DOS: the patient was seen and examined on 06/25/2023 PCP: Valli Gaw, MD  Patient coming from: Home  Chief Complaint:  Chief Complaint  Patient presents with   Fall    12 stairs    HPI: Levi Mejia is a 53 y.o. male with medical history significant for Severe alcohol use disorder with history of withdrawal seizures, prior admissions with request for detox, liver steatosis with elevated LFTs, HTN, being admitted for acute back pain with inability to ambulate after falling down a flight of 12 stairs the night prior while intoxicated.  He voices interest in detox. ED course and data review: Vitals within normal limits Labs notable for AST/ALT 211/55 and total bili 1.3 Potassium 3.1 Other labs including CBC with differential, CMP, troponin, CK, salicylate and acetaminophen  levels, UA for the most part unremarkable EtOH level pending  Patient was pan scanned with CT head, CT C, L, T-spine and CT abdomen and pelvis with contrast Notable findings on imaging: No acute or traumatic finding but showing chronic multilevel lumbar disc degeneration, more prominent on the left  Patient was treated with Toradol, fentanyl and D Decadron and a lidocaine  patch was placed however he continued to have intractable pain and difficulty raising his left leg.  He was unable to ambulate for this reason.  MRI ordered from ED, pending  Hospitalist consulted for admission.     Review of Systems: As mentioned in the history of present illness. All other systems reviewed and are negative.  Past Medical History:  Diagnosis Date   Alcohol abuse    Attention deficit disorder without mention of hyperactivity    HTN (hypertension)    Hyperlipidemia 07/03/2022   ICH (intracerebral hemorrhage) (HCC)    Seizures (HCC)    Stroke [facility])    Past Surgical History:  Procedure Laterality Date    ESOPHAGOGASTRODUODENOSCOPY (EGD) WITH PROPOFOL  N/A 07/04/2022   Procedure: ESOPHAGOGASTRODUODENOSCOPY (EGD) WITH PROPOFOL ;  Surgeon: Selena Daily, MD;  Location: [facility] ENDOSCOPY;  Service: Endoscopy;  Laterality: N/A;   Hand tendon reattachment  02/28/1990   Right hand   toe reattachment  02/28/1994   Right 1st toe   Social History:  reports that he has never smoked. He has never used smokeless tobacco. He reports current alcohol use. He reports that he does not currently use drugs after having used the following drugs: Marijuana.  No Active Allergies  Family History  Problem Relation Age of Onset   Coronary artery disease Mother 60       Stent   Cancer Mother        Bone   Hypertension Mother    Heart failure Father    Heart attack Father 76   Hypertension Father    Alcohol abuse Father    Hypertension Brother    Diabetes Neg Hx    Prostate cancer Neg Hx    Colon cancer Neg Hx    Heart disease Neg Hx     Prior to Admission medications   Medication Sig Start Date End Date Taking? Authorizing Provider  aspirin  EC 81 MG tablet Take 1 tablet (81 mg total) by mouth daily. 04/08/22   Alexander, Natalie, DO  diclofenac  Sodium (VOLTAREN ) 1 % GEL Apply 4 g topically 3 (three) times daily as needed. 08/01/22   Calista Catching, FNP  feeding supplement (ENSURE ENLIVE / ENSURE PLUS) LIQD Take 237 mLs by mouth 3 (three) times daily  between meals. 10/19/22   Montey Apa, DO  folic acid  (FOLVITE ) 1 MG tablet Take 1 tablet (1 mg total) by mouth daily. 08/18/22   Valli Gaw, MD  losartan  (COZAAR ) 25 MG tablet Take 0.5 tablets (12.5 mg total) by mouth daily. 08/01/22   Calista Catching, FNP  Multiple Vitamin (MULTIVITAMIN WITH MINERALS) TABS tablet Take 1 tablet by mouth daily. 04/09/22   Alexander, Natalie, DO  ondansetron  (ZOFRAN -ODT) 4 MG disintegrating tablet Take 1 tablet (4 mg total) by mouth every 8 (eight) hours as needed for nausea or vomiting. 12/01/22   Twilla Galea, MD   pantoprazole  (PROTONIX ) 40 MG tablet Take 1 tablet (40 mg total) by mouth 2 (two) times daily before a meal. 10/06/22   Valli Gaw, MD  potassium chloride  SA (KLOR-CON  M) 20 MEQ tablet Take 1 tablet (20 mEq total) by mouth 2 (two) times daily for 5 days. 12/01/22 12/06/22  Twilla Galea, MD  thiamine  (VITAMIN B1) 100 MG tablet Take 100 mg by mouth daily. 07/04/22   [provider]    Physical Exam: Vitals:   06/25/23 1559 06/25/23 1930 06/25/23 2000 06/25/23 2021  BP: 103/77 97/72 117/81   Pulse: 82 69 72 66  Resp: 20 12 (!) 8 10  Temp: 98.7 F (37.1 C)     TempSrc: Oral     SpO2: 99% 94% 97% 96%  Weight: 68 kg     Height: 5\' 10"  (1.778 m)      Physical Exam  Labs on Admission: I have personally reviewed following labs and imaging studies  CBC: Recent Labs  Lab 06/25/23 1601  WBC 9.3  NEUTROABS 6.1  HGB 14.1  HCT 41.0  MCV 107.6*  PLT 306   Basic Metabolic Panel: Recent Labs  Lab 06/25/23 1601  NA 141  K 3.1*  CL 103  CO2 29  GLUCOSE 116*  BUN <5*  CREATININE 0.69  CALCIUM  7.9*   GFR: Estimated Creatinine Clearance: 103.9 mL/min (by C-G formula based on SCr of 0.69 mg/dL). Liver Function Tests: Recent Labs  Lab 06/25/23 1601  AST 211*  ALT 55*  ALKPHOS 125  BILITOT 1.3*  PROT 7.2  ALBUMIN 3.2*   No results for input(s): "LIPASE", "AMYLASE" in the last 168 hours. No results for input(s): "AMMONIA" in the last 168 hours. Coagulation Profile: No results for input(s): "INR", "PROTIME" in the last 168 hours. Cardiac Enzymes: Recent Labs  Lab 06/25/23 1601  CKTOTAL 184   BNP (last 3 results) No results for input(s): "PROBNP" in the last 8760 hours. HbA1C: No results for input(s): "HGBA1C" in the last 72 hours. CBG: No results for input(s): "GLUCAP" in the last 168 hours. Lipid Profile: No results for input(s): "CHOL", "HDL", "LDLCALC", "TRIG", "CHOLHDL", "LDLDIRECT" in the last 72 hours. Thyroid Function Tests: No results for input(s):  "TSH", "T4TOTAL", "FREET4", "T3FREE", "THYROIDAB" in the last 72 hours. Anemia Panel: No results for input(s): "VITAMINB12", "FOLATE", "FERRITIN", "TIBC", "IRON", "RETICCTPCT" in the last 72 hours. Urine analysis:    Component Value Date/Time   COLORURINE YELLOW (A) 06/25/2023 1601   APPEARANCEUR CLEAR (A) 06/25/2023 1601   APPEARANCEUR Clear 03/25/2014 1100   LABSPEC >1.046 (H) 06/25/2023 1601   LABSPEC >1.060 03/25/2014 1100   PHURINE 6.0 06/25/2023 1601   GLUCOSEU NEGATIVE 06/25/2023 1601   GLUCOSEU Negative 03/25/2014 1100   HGBUR NEGATIVE 06/25/2023 1601   HGBUR negative 07/17/2008 0956   BILIRUBINUR NEGATIVE 06/25/2023 1601   BILIRUBINUR Negative 03/25/2014 1100   KETONESUR NEGATIVE 06/25/2023  1601   PROTEINUR NEGATIVE 06/25/2023 1601   UROBILINOGEN 0.2 07/17/2008 0956   NITRITE NEGATIVE 06/25/2023 1601   LEUKOCYTESUR NEGATIVE 06/25/2023 1601   LEUKOCYTESUR Negative 03/25/2014 1100    Radiological Exams on Admission: CT CHEST ABDOMEN PELVIS W CONTRAST Result Date: 06/25/2023 EXAM: CT CHEST, ABDOMEN AND PELVIS WITH CONTRAST 06/25/2023 05:25:02 PM TECHNIQUE: CT of the chest, abdomen and pelvis was performed with of iohexol  (OMNIPAQUE ) 300 MG/ML solution. Multiplanar reformatted images are provided for review. Automated exposure control, iterative reconstruction, and/or weight based adjustment of the mA/kV was utilized to reduce the radiation dose to as low as reasonably achievable. COMPARISON: CTA chest 02/06/2023 and CT abdomen/pelvis 12/01/2022. CLINICAL HISTORY: Polytrauma, blunt. FINDINGS: MEDIASTINUM: Moderate coronary atherosclerosis of the LAD (left anterior descending) and right coronary artery. No evidence of traumatic aortic injury. LYMPH NODES: No evidence of mediastinal, hilar or axillary lymphadenopathy. LUNGS AND PLEURA: Mild bibasilar atelectasis. No focal consolidation or pulmonary edema. No evidence of pleural effusion or pneumothorax. HEPATOBILIARY: Focal  geographic hepatic steatosis. Gallbladder is unremarkable. No biliary ductal dilatation. SPLEEN: No acute abnormality. PANCREAS: No acute abnormality. ADRENAL GLANDS: No acute abnormality. KIDNEYS, URETERS AND BLADDER: No stones in the kidneys or ureters. No evidence of hydronephrosis. No evidence of perinephric or periureteral stranding. GI AND BOWEL: Normal appendix (image 98). Mild left colon diverticulosis, without evidence of diverticulitis. Stomach demonstrates no acute abnormality. There is no evidence of bowel obstruction. REPRODUCTIVE: Reproductive organs are unremarkable. PERITONEUM AND RETROPERITONEUM: No evidence of ascites or free air. Aorta is normal in caliber. No hemoperitoneum or free air. LYMPH NODES: No evidence of lymphadenopathy. BONES AND SOFT TISSUES: No fracture is seen. Dedicated thoracic and lumbar spine CT has been performed and reported separately. No focal soft tissue abnormality. Incidental adrenal and/or renal findings do not require follow up imaging. IMPRESSION: 1. No traumatic injury to the chest, abdomen, and pelvis. 2. Dedicated thoracic and lumbar spine CT has been performed and reported separately. Electronically signed by: Zadie Herter MD 06/25/2023 07:37 PM EDT RP Workstation: [id number]   CT T-SPINE NO CHARGE Result Date: 06/25/2023 CLINICAL DATA:  Trauma.  Ataxia. EXAM: CT THORACIC SPINE WITHOUT CONTRAST TECHNIQUE: Multidetector CT images of the thoracic were obtained using the standard protocol without intravenous contrast. RADIATION DOSE REDUCTION: This exam was performed according to the departmental dose-optimization program which includes automated exposure control, adjustment of the mA and/or kV according to patient size and/or use of iterative reconstruction technique. COMPARISON:  CT chest 02/06/2023 FINDINGS: Alignment: No malalignment. Vertebrae: Old inferior endplate fracture at T3, unchanged from prior exams. Old minimal superior endplate deformity at T6,  unchanged. Old superior endplate deformity/Schmorl's node at T11, unchanged. Paraspinal and other soft tissues: Negative Disc levels: T1-2: Normal except for facet arthritis left more than right. T2-3: Normal except for facet arthritis left more than right. T3-4: No canal or foraminal stenosis. T4-5 through T12-L1: No significant degenerative changes. No stenosis of the canal or foramina. IMPRESSION: No acute or traumatic finding. Old inferior endplate fracture at T3, unchanged from prior exams. Old minimal superior endplate deformity at T6, unchanged. Old superior endplate deformity/Schmorl's node at T11, unchanged. No canal or foraminal stenosis of significance. Facet osteoarthritis on the left at T1-2 and T2-3 could be associated with regional pain. Electronically Signed   By: Bettylou Brunner M.D.   On: 06/25/2023 17:53   CT L-SPINE NO CHARGE Result Date: 06/25/2023 CLINICAL DATA:  Ataxia.  Trauma. EXAM: CT LUMBAR SPINE WITHOUT CONTRAST TECHNIQUE: Multidetector CT imaging of the  lumbar spine was performed without intravenous contrast administration. Multiplanar CT image reconstructions were also generated. RADIATION DOSE REDUCTION: This exam was performed according to the departmental dose-optimization program which includes automated exposure control, adjustment of the mA and/or kV according to patient size and/or use of iterative reconstruction technique. COMPARISON:  None Available. FINDINGS: Segmentation: 5 lumbar type vertebral bodies. Alignment: No malalignment. Vertebrae: No regional fracture or focal bone lesion other than degenerative changes as below. Paraspinal and other soft tissues: Negative Disc levels: L1-2: Minimal disc bulge.  No stenosis. L2-3: Chronic disc degeneration with endplate sclerotic and cystic changes. Small endplate osteophytes and mild bulging of the disc. No compressive stenosis. L3-4: Mild disc bulge.  No stenosis. L4-5: Disc degeneration with vacuum phenomenon. Shallow protrusion  of the disc slightly more prominent in the left posterolateral direction with slight caudal down turning. Mild stenosis of the lateral recesses, left more than right. Neural compression could occur, particularly on the left. L5-S1: Disc degeneration with endplate osteophytes, vacuum phenomenon and shallow protrusion of the disc. No apparent compressive stenosis. IMPRESSION: 1. No acute or traumatic finding. 2. Chronic disc degeneration at L2-3, L4-5 and L5-S1. At L4-5, there is a shallow protrusion of the disc more prominent in the left posterolateral direction with slight caudal down turning. Mild stenosis of the lateral recesses, left more than right. Neural compression could occur, particularly on the left. Electronically Signed   By: Bettylou Brunner M.D.   On: 06/25/2023 17:50   CT Cervical Spine Wo Contrast Result Date: 06/25/2023 CLINICAL DATA:  Ataxia.  Trauma to the head and neck. EXAM: CT CERVICAL SPINE WITHOUT CONTRAST TECHNIQUE: Multidetector CT imaging of the cervical spine was performed without intravenous contrast. Multiplanar CT image reconstructions were also generated. RADIATION DOSE REDUCTION: This exam was performed according to the departmental dose-optimization program which includes automated exposure control, adjustment of the mA and/or kV according to patient size and/or use of iterative reconstruction technique. COMPARISON:  None Available. FINDINGS: Alignment: Normal Skull base and vertebrae: No regional fracture or focal bone lesion. Soft tissues and spinal canal: No traumatic soft tissue finding. Disc levels: Mild spondylosis at C4-5, C5-6 and C6-7 with small uncovertebral osteophytes. This is most notable at C5-6 where there is mild bilateral foraminal narrowing. No apparent compressive canal stenosis. Facet osteoarthritis is present on the right at C4-5 which could be painful. This results in some foraminal narrowing on the right at C4-5. Upper chest: Extreme lung apices are clear.  Other: None IMPRESSION: No acute or traumatic finding. Mild spondylosis at C4-5, C5-6 and C6-7. Facet osteoarthritis on the right at C4-5 which could be painful. Electronically Signed   By: Bettylou Brunner M.D.   On: 06/25/2023 17:47   CT HEAD WO CONTRAST ( ) Result Date: 06/25/2023 CLINICAL DATA:  Head trauma.  Abnormal mental status. EXAM: CT HEAD WITHOUT CONTRAST TECHNIQUE: Contiguous axial images were obtained from the base of the skull through the vertex without intravenous contrast. RADIATION DOSE REDUCTION: This exam was performed according to the departmental dose-optimization program which includes automated exposure control, adjustment of the mA and/or kV according to patient size and/or use of iterative reconstruction technique. COMPARISON:  04/25/2023 FINDINGS: Brain: Chronic ischemic changes of the white matter, most notable in the right frontal lobe. No sign of acute infarction. No traumatic finding. No mass, hemorrhage, hydrocephalus or extra-axial collection. Vascular: No abnormal vascular finding. Skull: Negative Sinuses/Orbits: Clear/normal Other: None IMPRESSION: No acute or traumatic finding. Chronic ischemic changes of the white matter, most notable  in the right frontal lobe. Electronically Signed   By: Bettylou Brunner M.D.   On: 06/25/2023 17:45   Data Reviewed for HPI: Relevant notes from primary care and specialist visits, past discharge summaries as available in EHR, including Care Everywhere. Prior diagnostic testing as pertinent to current admission diagnoses Updated medications and problem lists for reconciliation ED course, including vitals, labs, imaging, treatment and response to treatment Triage notes, nursing and pharmacy notes and ED provider's notes Notable results as noted above in HPI      Assessment and Plan: * Intractable back pain, acute on chronic Fall down flight of 12 steps Initial pan CT scan negative for acute trauma but showing chronic multilevel  degeneration more prominent on left Continue Decadron,  Continue multimodal pain management with NSAIDs, muscle relaxants, gabapentin, lidocaine  patch PT OT consult Follow MRI  Alcohol use disorder, severe, dependence (HCC) CIWA withdrawal protocol Counseled on abstinence TOC consult for resources on detox  Abnormal LFTs Secondary to alcohol Continue to monitor  Essential hypertension, benign BP controlled.  Continue losartan   Hypokalemia Oral repletion with K-Dur 40 mill equivalents     DVT prophylaxis: Lovenox   Consults: none  Advance Care Planning:   Code Status: Prior   Family Communication: none***  Disposition Plan: Back to previous home environment  Severity of Illness: The appropriate patient status for this patient is OBSERVATION. Observation status is judged to be reasonable and necessary in order to provide the required intensity of service to ensure the patient's safety. The patient's presenting symptoms, physical exam findings, and initial radiographic and laboratory data in the context of their medical condition is felt to place them at decreased risk for further clinical deterioration. Furthermore, it is anticipated that the patient will be medically stable for discharge from the hospital within 2 midnights of admission.   Author: Lanetta Pion, MD 06/25/2023 9:48 PM  For on call review [email or web address].

## 2023-06-25 NOTE — ED Notes (Signed)
 Pt given urinal to use bathroom. Urine sample collected.

## 2023-06-25 NOTE — Assessment & Plan Note (Signed)
 Secondary to alcohol Continue to monitor

## 2023-06-25 NOTE — ED Triage Notes (Signed)
 Pt to ED, brought and dropped off by wife. Per wife, pt is daily drinker of atleast 1/5 vodka daily and was intoxicated last night and fell down 12 stairs. Unknown if LOC or head trauma, pt does not remember fall.   States he told her this AM and he also was intoxicated this AM and could not get off toilet by himself. Pt says last drink was last night. Complaining of  lower L back pain, pain makes it hard to walk.   Wife states she is "fed up" and asks that pt be referred to [facility] in [place] for rehab.   Lt green, red, purple tubes sent. Pt is alert, oriented.

## 2023-06-25 NOTE — Assessment & Plan Note (Signed)
 Oral repletion with K-Dur 40 mill equivalents

## 2023-06-26 MED ORDER — LISINOPRIL 5 MG PO TABS: 5.0000 mg | ORAL_TABLET | Freq: Every day | ORAL | 2 refills | Status: DC

## 2023-06-26 MED ORDER — HYDROCODONE-ACETAMINOPHEN 5-325 MG PO TABS
1.0000 | ORAL_TABLET | Freq: Once | ORAL | Status: AC
  Administered 2023-06-26: 1 via ORAL
  Filled 2023-06-26: qty 1

## 2023-06-26 NOTE — ED Notes (Signed)
 Pharm tech asked for med rec due to accepting facility wanting meds ordered prior to pts arrival.

## 2023-06-26 NOTE — ED Notes (Signed)
 This RN spoke to [employer] staff and gave report and also staff stated they needed meds sent to [facility], MD aware.

## 2023-06-26 NOTE — ED Notes (Signed)
 Pt is laying in bed on left side with eyes closed. No distress observed.

## 2023-06-26 NOTE — ED Notes (Signed)
 Wife states that she will come to pick patient up and take him to the [facility]. [facility] is aware that wife will be bringing him

## 2023-06-26 NOTE — Discharge Instructions (Addendum)
 Go to [facility].   1. No acute or traumatic finding. 2. Chronic disc degeneration at L2-3, L4-5 and L5-S1. At L4-5, there is a shallow protrusion of the disc more prominent in the left posterolateral direction with slight caudal down turning. Mild stenosis of the lateral recesses, left more than right. Neural compression could occur, particularly on the left.

## 2023-06-26 NOTE — BH Assessment (Signed)
 TTS completed follow up with [facility] (Will).  TTS informed that the referral was received, however, the nurses were going through shift change and had not had a chance to review the paperwork.  TTS was placed on hold while [facility] staff reviewed the notes.  After 30 minutes, this writer had to hang up due to it being the end of shift.  Called patient's wife to inform her that [facility] did receive the referral but has not had a chance to look at his paperwork.   Patient's wife inquired as to whether he would be able to get a transportation voucher. Patient's wife was advised to follow up with the nurse on duty to inquire.

## 2023-06-26 NOTE — ED Provider Notes (Signed)
-----------------------------------------   6:56 AM on 06/26/2023 -----------------------------------------   No events overnight.  Patient remains in the emergency department pending bed availability at [facility].   Demarques Pilz J, MD 06/26/23 [phone]

## 2023-06-26 NOTE — ED Notes (Signed)
 Patient states he does not want to wait for transportation to [facility] any longer. States he would like to get an [employer] to take him home and get a ride from home to the [facility]. Called TTS to inform of patients decision. Informed patient that per [facility], if it is not direct admission from Hospital to [facility], the facility may potentially rescind the bed due to him leaving early during his last stay. Patient is aware and states "Ill take my chances once I get a ride there." MD will follow up to discuss with patient.

## 2023-06-26 NOTE — ED Provider Notes (Signed)
 Pt waiting on transport to [facility]. Requesting to be dc home.  The wife is going to take him to the [facility] instead of waiting for safe transport.  They are going to hold a spot for him.    Lubertha Rush, MD 06/26/23 [phone]

## 2023-08-14 ENCOUNTER — Encounter: Payer: Self-pay | Admitting: Family Medicine

## 2023-08-29 ENCOUNTER — Emergency Department: Payer: Self-pay

## 2023-08-29 ENCOUNTER — Other Ambulatory Visit: Payer: Self-pay

## 2023-08-29 ENCOUNTER — Emergency Department
Admission: EM | Admit: 2023-08-29 | Discharge: 2023-08-29 | Disposition: A | Discharge status: Home / Self Care | Payer: Self-pay | Attending: Emergency Medicine | Admitting: Emergency Medicine

## 2023-08-29 DIAGNOSIS — F10120 Alcohol abuse with intoxication, uncomplicated: Secondary | ICD-10-CM | POA: Insufficient documentation

## 2023-08-29 DIAGNOSIS — I10 Essential (primary) hypertension: Secondary | ICD-10-CM | POA: Insufficient documentation

## 2023-08-29 DIAGNOSIS — Y908 Blood alcohol level of 240 mg/100 ml or more: Secondary | ICD-10-CM | POA: Insufficient documentation

## 2023-08-29 DIAGNOSIS — F10920 Alcohol use, unspecified with intoxication, uncomplicated: Secondary | ICD-10-CM

## 2023-08-29 LAB — COMPREHENSIVE METABOLIC PANEL WITH GFR
ALT: 34 U/L (ref 0–44)
AST: 103 U/L — ABNORMAL HIGH (ref 15–41)
Albumin: 3.4 g/dL — ABNORMAL LOW (ref 3.5–5.0)
Alkaline Phosphatase: 67 U/L (ref 38–126)
Anion gap: 18 — ABNORMAL HIGH (ref 5–15)
BUN: 6 mg/dL (ref 6–20)
CO2: 24 mmol/L (ref 22–32)
Calcium: 9 mg/dL (ref 8.9–10.3)
Chloride: 102 mmol/L (ref 98–111)
Creatinine, Ser: 0.81 mg/dL (ref 0.61–1.24)
GFR, Estimated: >60 mL/min (ref >60)
Glucose, Bld: 106 mg/dL — ABNORMAL HIGH (ref 70–99)
Potassium: 3.5 mmol/L (ref 3.5–5.1)
Sodium: 144 mmol/L (ref 135–145)
Total Bilirubin: 1 mg/dL (ref 0.0–1.2)
Total Protein: 7.1 g/dL (ref 6.5–8.1)

## 2023-08-29 LAB — CBC WITH DIFFERENTIAL/PLATELET
Abs Immature Granulocytes: 0.01 10*3/uL (ref 0.00–0.07)
Basophils Absolute: 0.1 10*3/uL (ref 0.0–0.1)
Basophils Relative: 2 %
Eosinophils Absolute: 0.4 10*3/uL (ref 0.0–0.5)
Eosinophils Relative: 7 %
HCT: 39.4 % (ref 39.0–52.0)
Hemoglobin: 13.5 g/dL (ref 13.0–17.0)
Immature Granulocytes: 0 %
Lymphocytes Relative: 30 %
Lymphs Abs: 1.7 10*3/uL (ref 0.7–4.0)
MCH: 33.7 pg (ref 26.0–34.0)
MCHC: 34.3 g/dL (ref 30.0–36.0)
MCV: 98.3 fL (ref 80.0–100.0)
Monocytes Absolute: 0.5 10*3/uL (ref 0.1–1.0)
Monocytes Relative: 9 %
Neutro Abs: 2.9 10*3/uL (ref 1.7–7.7)
Neutrophils Relative %: 52 %
Platelets: 112 10*3/uL — ABNORMAL LOW (ref 150–400)
RBC: 4.01 MIL/uL — ABNORMAL LOW (ref 4.22–5.81)
RDW: 14.5 % (ref 11.5–15.5)
WBC: 5.6 10*3/uL (ref 4.0–10.5)
nRBC: 0 % (ref 0.0–0.2)

## 2023-08-29 LAB — ETHANOL: Alcohol, Ethyl (B): 298 mg/dL — ABNORMAL HIGH (ref <15)

## 2023-08-29 LAB — MAGNESIUM: Magnesium: 1.5 mg/dL — ABNORMAL LOW (ref 1.7–2.4)

## 2023-08-29 LAB — TROPONIN I (HIGH SENSITIVITY): Troponin I (High Sensitivity): 4 ng/L (ref <18)

## 2023-08-29 MED ORDER — SODIUM CHLORIDE 0.9 % IV BOLUS
1000.0000 mL | Freq: Once | INTRAVENOUS | Status: AC
  Administered 2023-08-29: 1000 mL via INTRAVENOUS

## 2023-08-29 MED ORDER — LORAZEPAM 2 MG/ML IJ SOLN
2.0000 mg | Freq: Once | INTRAMUSCULAR | Status: AC
  Administered 2023-08-29: 2 mg via INTRAVENOUS
  Filled 2023-08-29: qty 1

## 2023-08-29 MED ORDER — MAGNESIUM SULFATE 2 GM/50ML IV SOLN
2.0000 g | Freq: Once | INTRAVENOUS | Status: AC
  Administered 2023-08-29: 2 g via INTRAVENOUS
  Filled 2023-08-29: qty 50

## 2023-08-29 NOTE — ED Triage Notes (Signed)
 From home, EMS called by wife d/t involuntary left leg contractions similar to prior seizure (in 2022). Pt did not actually have a seizure this time. States he drinks daily and has been decreasing the amount recently. Complains about dizziness and headache. EMS gave midazolam 2 mg and versed 4 mg.

## 2023-08-29 NOTE — ED Notes (Signed)
Seizure pads placed on both sides of bed.

## 2023-08-29 NOTE — ED Provider Notes (Signed)
 [facility] Provider Note    Event Date/Time   First MD Initiated Contact with Patient 08/29/23 1559     (approximate)   History   Chief Complaint Dizziness   HPI  Levi Mejia is a 53 y.o. male with past medical history of hypertension, stroke, seizures, and alcohol abuse who presents to the ED complaining of dizziness.  Patient reports that he has been feeling shaky and generally unwell for much of the day today.  He states that he typically feels this way before he has a seizure.  He does not currently take medication for seizures, states he has only had 2 of them and they both occurred after he had tried to cut back on alcohol.  He states that his last drink was last night and he typically drinks 1/5 of liquor daily.  He has been feeling shaky today but denies any nausea or vomiting, does state that he has been intentionally trying to cut back on alcohol consumption today.  He also endorses some right-sided chest discomfort, denies cough or difficulty breathing.     Physical Exam   Triage Vital Signs: ED Triage Vitals  Encounter Vitals Group     BP      Girls Systolic BP Percentile      Girls Diastolic BP Percentile      Boys Systolic BP Percentile      Boys Diastolic BP Percentile      Pulse      Resp      Temp      Temp src      SpO2      Weight      Height      Head Circumference      Peak Flow      Pain Score      Pain Loc      Pain Education      Exclude from Growth Chart     Most recent vital signs: Vitals:   08/29/23 1612  BP: 114/87  Pulse: 93  Resp: 18  Temp: 98.1 F (36.7 C)  SpO2: 91%    Constitutional: Alert and oriented. Eyes: Conjunctivae are normal. Head: Atraumatic. Nose: No congestion/rhinnorhea. Mouth/Throat: Mucous membranes are moist.  Cardiovascular: Normal rate, regular rhythm. Grossly normal heart sounds.  2+ radial pulses bilaterally. Respiratory: Normal respiratory effort.  No retractions.  Lungs CTAB.  Right chest wall tenderness to palpation noted. Gastrointestinal: Soft and nontender. No distention. Musculoskeletal: No lower extremity tenderness nor edema.  Neurologic: Tremulous with tongue fasciculations.  Normal speech and language. No gross focal neurologic deficits are appreciated.    ED Results / Procedures / Treatments   Labs (all labs ordered are listed, but only abnormal results are displayed) Labs Reviewed  CBC WITH DIFFERENTIAL/PLATELET - Abnormal; Notable for the following components:      Result Value   RBC 4.01 (*)    Platelets 112 (*)    All other components within normal limits  COMPREHENSIVE METABOLIC PANEL WITH GFR - Abnormal; Notable for the following components:   Glucose, Bld 106 (*)    Albumin 3.4 (*)    AST 103 (*)    Anion gap 18 (*)    All other components within normal limits  MAGNESIUM  - Abnormal; Notable for the following components:   Magnesium  1.5 (*)    All other components within normal limits  ETHANOL - Abnormal; Notable for the following components:   Alcohol, Ethyl (B) 298 (*)  All other components within normal limits  TROPONIN I (HIGH SENSITIVITY)     EKG  ED ECG REPORT I, Carlin Palin, the attending physician, personally viewed and interpreted this ECG.   Date: 08/29/2023  EKG Time: 16:03  Rate: 95  Rhythm: normal sinus rhythm  Axis: Normal  Intervals:none  ST&T Change: None  Radiology: Chest x-ray reviewed and interpreted by me with no infiltrate, edema, or effusion.  PROCEDURES:  Critical Care performed: No  Procedures   MEDICATIONS ORDERED IN ED: Medications  magnesium  sulfate IVPB 2 g 50 mL (has no administration in time range)  LORazepam  (ATIVAN ) injection 2 mg (2 mg Intravenous Given 08/29/23 1636)  sodium chloride  0.9 % bolus 1,000 mL (1,000 mLs Intravenous New Bag/Given 08/29/23 1636)     IMPRESSION / MDM / ASSESSMENT AND PLAN / ED COURSE  I reviewed the triage vital signs and the nursing  notes.                              53 y.o. male with past medical history of hypertension, stroke, seizures, and alcohol abuse who presents to the ED complaining of dizziness, feeling shaky, and generalized malaise which he states typically precedes a seizure.  Patient's presentation is most consistent with acute presentation with potential threat to life or bodily function.  Differential diagnosis includes, but is not limited to, alcohol withdrawal, dehydration, electrolyte abnormality, AKI, hepatitis, seizure.  Patient nontoxic-appearing and in no acute distress, does appear tremulous with tongue fasciculations, vital signs are reassuring.  I suspect he is developing early alcohol withdrawal and we will give dose of IV Ativan , hydrate with IV fluids.  EKG shows no evidence of arrhythmia or ischemia, labs and chest x-ray are pending at this time.  He reports some right-sided chest pain but this is reproducible with palpation, low suspicion for cardiac etiology.  Chest x-ray is unremarkable, labs with mild elevation in AST consistent with his regular alcohol consumption, downtrending from previous.  No significant anemia, leukocytosis, or AKI noted.  Patient does have some hypomagnesemia which was repleted.  He is awake and alert on reassessment, appears clinically sober with no signs of withdrawal at this time.  He states he feels better and he is appropriate for discharge home with outpatient follow-up.  He was counseled to return to the ED for new or worsening symptoms, patient agrees with plan.      FINAL CLINICAL IMPRESSION(S) / ED DIAGNOSES   Final diagnoses:  Alcoholic intoxication without complication (HCC)  Hypomagnesemia     Rx / DC Orders   ED Discharge Orders     None        Note:  This document was prepared using Dragon voice recognition software and may include unintentional dictation errors.   Palin Carlin, MD 08/29/23 [phone]

## 2023-08-30 ENCOUNTER — Other Ambulatory Visit: Payer: Self-pay

## 2023-08-30 ENCOUNTER — Inpatient Hospital Stay
Admission: EM | Admit: 2023-08-30 | Discharge: 2023-09-01 | DRG: 897 | Disposition: A | Discharge status: Home / Self Care | Payer: Self-pay | Attending: Hospitalist | Admitting: Hospitalist

## 2023-08-30 ENCOUNTER — Emergency Department: Payer: Self-pay

## 2023-08-30 DIAGNOSIS — E876 Hypokalemia: Secondary | ICD-10-CM | POA: Diagnosis present

## 2023-08-30 DIAGNOSIS — E785 Hyperlipidemia, unspecified: Secondary | ICD-10-CM | POA: Diagnosis present

## 2023-08-30 DIAGNOSIS — I10 Essential (primary) hypertension: Secondary | ICD-10-CM | POA: Diagnosis present

## 2023-08-30 DIAGNOSIS — D539 Nutritional anemia, unspecified: Secondary | ICD-10-CM | POA: Diagnosis present

## 2023-08-30 DIAGNOSIS — G4089 Other seizures: Secondary | ICD-10-CM | POA: Diagnosis present

## 2023-08-30 DIAGNOSIS — K760 Fatty (change of) liver, not elsewhere classified: Secondary | ICD-10-CM | POA: Diagnosis present

## 2023-08-30 DIAGNOSIS — Z91128 Patient's intentional underdosing of medication regimen for other reason: Secondary | ICD-10-CM

## 2023-08-30 DIAGNOSIS — Z7982 Long term (current) use of aspirin: Secondary | ICD-10-CM

## 2023-08-30 DIAGNOSIS — Y908 Blood alcohol level of 240 mg/100 ml or more: Secondary | ICD-10-CM | POA: Diagnosis present

## 2023-08-30 DIAGNOSIS — D6959 Other secondary thrombocytopenia: Secondary | ICD-10-CM | POA: Diagnosis present

## 2023-08-30 DIAGNOSIS — Z8249 Family history of ischemic heart disease and other diseases of the circulatory system: Secondary | ICD-10-CM

## 2023-08-30 DIAGNOSIS — F10931 Alcohol use, unspecified with withdrawal delirium: Secondary | ICD-10-CM

## 2023-08-30 DIAGNOSIS — F10939 Alcohol use, unspecified with withdrawal, unspecified: Principal | ICD-10-CM

## 2023-08-30 DIAGNOSIS — F1020 Alcohol dependence, uncomplicated: Secondary | ICD-10-CM | POA: Diagnosis present

## 2023-08-30 DIAGNOSIS — Z79899 Other long term (current) drug therapy: Secondary | ICD-10-CM

## 2023-08-30 DIAGNOSIS — R569 Unspecified convulsions: Principal | ICD-10-CM

## 2023-08-30 DIAGNOSIS — Z811 Family history of alcohol abuse and dependence: Secondary | ICD-10-CM

## 2023-08-30 DIAGNOSIS — F10229 Alcohol dependence with intoxication, unspecified: Secondary | ICD-10-CM | POA: Diagnosis present

## 2023-08-30 DIAGNOSIS — E559 Vitamin D deficiency, unspecified: Secondary | ICD-10-CM | POA: Diagnosis present

## 2023-08-30 DIAGNOSIS — K7010 Alcoholic hepatitis without ascites: Secondary | ICD-10-CM | POA: Diagnosis present

## 2023-08-30 DIAGNOSIS — T39016A Underdosing of aspirin, initial encounter: Secondary | ICD-10-CM | POA: Diagnosis present

## 2023-08-30 DIAGNOSIS — F10231 Alcohol dependence with withdrawal delirium: Principal | ICD-10-CM | POA: Diagnosis present

## 2023-08-30 DIAGNOSIS — Z8673 Personal history of transient ischemic attack (TIA), and cerebral infarction without residual deficits: Secondary | ICD-10-CM

## 2023-08-30 LAB — CBC WITH DIFFERENTIAL/PLATELET
Abs Immature Granulocytes: 0.01 10*3/uL (ref 0.00–0.07)
Basophils Absolute: 0.1 10*3/uL (ref 0.0–0.1)
Basophils Relative: 2 %
Eosinophils Absolute: 0.4 10*3/uL (ref 0.0–0.5)
Eosinophils Relative: 10 %
HCT: 38.1 % — ABNORMAL LOW (ref 39.0–52.0)
Hemoglobin: 12.8 g/dL — ABNORMAL LOW (ref 13.0–17.0)
Immature Granulocytes: 0 %
Lymphocytes Relative: 34 %
Lymphs Abs: 1.5 10*3/uL (ref 0.7–4.0)
MCH: 34 pg (ref 26.0–34.0)
MCHC: 33.6 g/dL (ref 30.0–36.0)
MCV: 101.3 fL — ABNORMAL HIGH (ref 80.0–100.0)
Monocytes Absolute: 0.4 10*3/uL (ref 0.1–1.0)
Monocytes Relative: 10 %
Neutro Abs: 1.9 10*3/uL (ref 1.7–7.7)
Neutrophils Relative %: 44 %
Platelets: 88 10*3/uL — ABNORMAL LOW (ref 150–400)
RBC: 3.76 MIL/uL — ABNORMAL LOW (ref 4.22–5.81)
RDW: 14.5 % (ref 11.5–15.5)
Smear Review: NORMAL
WBC: 4.3 10*3/uL (ref 4.0–10.5)
nRBC: 0 % (ref 0.0–0.2)

## 2023-08-30 LAB — BASIC METABOLIC PANEL WITH GFR
Anion gap: 10 (ref 5–15)
BUN: <5 mg/dL — ABNORMAL LOW (ref 6–20)
CO2: 26 mmol/L (ref 22–32)
Calcium: 7.9 mg/dL — ABNORMAL LOW (ref 8.9–10.3)
Chloride: 105 mmol/L (ref 98–111)
Creatinine, Ser: 0.8 mg/dL (ref 0.61–1.24)
GFR, Estimated: >60 mL/min (ref >60)
Glucose, Bld: 93 mg/dL (ref 70–99)
Potassium: 4.1 mmol/L (ref 3.5–5.1)
Sodium: 141 mmol/L (ref 135–145)

## 2023-08-30 LAB — COMPREHENSIVE METABOLIC PANEL WITH GFR
ALT: 30 U/L (ref 0–44)
AST: 85 U/L — ABNORMAL HIGH (ref 15–41)
Albumin: 3.2 g/dL — ABNORMAL LOW (ref 3.5–5.0)
Alkaline Phosphatase: 59 U/L (ref 38–126)
Anion gap: 13 (ref 5–15)
BUN: 5 mg/dL — ABNORMAL LOW (ref 6–20)
CO2: 26 mmol/L (ref 22–32)
Calcium: 8.1 mg/dL — ABNORMAL LOW (ref 8.9–10.3)
Chloride: 105 mmol/L (ref 98–111)
Creatinine, Ser: 0.82 mg/dL (ref 0.61–1.24)
GFR, Estimated: >60 mL/min (ref >60)
Glucose, Bld: 75 mg/dL (ref 70–99)
Potassium: 3 mmol/L — ABNORMAL LOW (ref 3.5–5.1)
Sodium: 144 mmol/L (ref 135–145)
Total Bilirubin: 0.8 mg/dL (ref 0.0–1.2)
Total Protein: 6.5 g/dL (ref 6.5–8.1)

## 2023-08-30 LAB — PHOSPHORUS: Phosphorus: 3.2 mg/dL (ref 2.5–4.6)

## 2023-08-30 LAB — HIV ANTIBODY (ROUTINE TESTING W REFLEX): HIV Screen 4th Generation wRfx: NONREACTIVE

## 2023-08-30 LAB — CBG MONITORING, ED: Glucose-Capillary: 92 mg/dL (ref 70–99)

## 2023-08-30 LAB — ETHANOL: Alcohol, Ethyl (B): 278 mg/dL — ABNORMAL HIGH (ref <15)

## 2023-08-30 LAB — MAGNESIUM: Magnesium: 1.8 mg/dL (ref 1.7–2.4)

## 2023-08-30 MED ORDER — ONDANSETRON HCL 4 MG/2ML IJ SOLN
4.0000 mg | Freq: Four times a day (QID) | INTRAMUSCULAR | Status: DC | PRN
  Administered 2023-08-31 (×2): 4 mg via INTRAVENOUS
  Filled 2023-08-30 (×2): qty 2

## 2023-08-30 MED ORDER — SODIUM CHLORIDE 0.9% FLUSH
3.0000 mL | Freq: Two times a day (BID) | INTRAVENOUS | Status: DC
  Administered 2023-08-30 – 2023-09-01 (×5): 3 mL via INTRAVENOUS

## 2023-08-30 MED ORDER — LORAZEPAM 2 MG/ML IJ SOLN
0.0000 mg | Freq: Four times a day (QID) | INTRAMUSCULAR | Status: AC
  Administered 2023-08-31 (×2): 2 mg via INTRAVENOUS
  Filled 2023-08-30 (×2): qty 1

## 2023-08-30 MED ORDER — MAGNESIUM SULFATE 2 GM/50ML IV SOLN
2.0000 g | Freq: Once | INTRAVENOUS | Status: AC
  Administered 2023-08-30: 2 g via INTRAVENOUS
  Filled 2023-08-30: qty 50

## 2023-08-30 MED ORDER — LORAZEPAM 2 MG PO TABS: 0.0000 mg | ORAL_TABLET | Freq: Two times a day (BID) | ORAL | Status: DC

## 2023-08-30 MED ORDER — THIAMINE MONONITRATE 100 MG PO TABS
100.0000 mg | ORAL_TABLET | Freq: Every day | ORAL | Status: DC
  Administered 2023-08-30 – 2023-09-01 (×2): 100 mg via ORAL
  Filled 2023-08-30 (×3): qty 1

## 2023-08-30 MED ORDER — LORAZEPAM 2 MG PO TABS
0.0000 mg | ORAL_TABLET | Freq: Four times a day (QID) | ORAL | Status: AC
  Administered 2023-08-30 – 2023-08-31 (×2): 1 mg via ORAL
  Filled 2023-08-30 (×2): qty 1

## 2023-08-30 MED ORDER — POTASSIUM CHLORIDE CRYS ER 20 MEQ PO TBCR
40.0000 meq | EXTENDED_RELEASE_TABLET | Freq: Once | ORAL | Status: AC
  Administered 2023-08-30: 40 meq via ORAL
  Filled 2023-08-30: qty 2

## 2023-08-30 MED ORDER — LORAZEPAM 2 MG/ML IJ SOLN: 0.0000 mg | Freq: Two times a day (BID) | INTRAMUSCULAR | Status: DC

## 2023-08-30 MED ORDER — MAGNESIUM OXIDE -MG SUPPLEMENT 400 (240 MG) MG PO TABS
400.0000 mg | ORAL_TABLET | Freq: Two times a day (BID) | ORAL | Status: DC
  Administered 2023-08-30 – 2023-09-01 (×5): 400 mg via ORAL
  Filled 2023-08-30 (×5): qty 1

## 2023-08-30 MED ORDER — CHLORDIAZEPOXIDE HCL 10 MG PO CAPS
10.0000 mg | ORAL_CAPSULE | Freq: Three times a day (TID) | ORAL | Status: DC
  Administered 2023-08-30 – 2023-08-31 (×4): 10 mg via ORAL
  Filled 2023-08-30 (×3): qty 2
  Filled 2023-08-30: qty 1

## 2023-08-30 MED ORDER — POTASSIUM CHLORIDE 10 MEQ/100ML IV SOLN
10.0000 meq | INTRAVENOUS | Status: AC
  Administered 2023-08-30 (×6): 10 meq via INTRAVENOUS
  Filled 2023-08-30 (×5): qty 100

## 2023-08-30 MED ORDER — PHENOBARBITAL SODIUM 65 MG/ML IJ SOLN
130.0000 mg | Freq: Once | INTRAMUSCULAR | Status: AC
  Administered 2023-08-30: 130 mg via INTRAVENOUS
  Filled 2023-08-30: qty 2

## 2023-08-30 MED ORDER — ONDANSETRON 4 MG PO TBDP
4.0000 mg | ORAL_TABLET | Freq: Three times a day (TID) | ORAL | Status: DC | PRN
  Administered 2023-08-30 – 2023-08-31 (×2): 4 mg via ORAL
  Filled 2023-08-30 (×2): qty 1

## 2023-08-30 MED ORDER — VITAMIN D 25 MCG (1000 UNIT) PO TABS
1000.0000 [IU] | ORAL_TABLET | Freq: Every day | ORAL | Status: DC
  Administered 2023-08-30 – 2023-09-01 (×3): 1000 [IU] via ORAL
  Filled 2023-08-30 (×3): qty 1

## 2023-08-30 MED ORDER — THIAMINE HCL 100 MG/ML IJ SOLN
100.0000 mg | Freq: Every day | INTRAMUSCULAR | Status: DC
  Administered 2023-08-31: 100 mg via INTRAVENOUS
  Filled 2023-08-30: qty 2

## 2023-08-30 NOTE — ED Notes (Signed)
Fall bundle in place

## 2023-08-30 NOTE — ED Triage Notes (Signed)
 BIB EMS for DT's and alcohol induced seizures. Seen here earlier for same. Wife called out for approximately 5 episodes of seizure like activity characterized by the patient stiffening up. EMS states its getting more frequent. Wife states that the patient did express SI, but now patient denies any SI.

## 2023-08-30 NOTE — ED Notes (Signed)
 While triaging this patient, this RN witnessed patient stiffen up several times for 10-20 second at a time, but after the episode resolved the patient would resume talking and answering questions that this RN asked. Patient also did not have any bowel or bladder incontinence. This episode seemed similar to what family was describing per EMS. EMS also mentioned that patient seemed to have hallucinations in route to this facility.

## 2023-08-30 NOTE — ED Provider Notes (Signed)
 [facility] Provider Note    Event Date/Time   First MD Initiated Contact with Patient 08/30/23 0215     (approximate)   History   Seizures   HPI Levi Mejia is a 53 y.o. male who presents by EMS for evaluation of multiple seizures at home.  He was seen earlier today for alcohol withdrawal symptoms.  His wife reported to the paramedics that he has a history of seizures and DTs in the past when trying to stop drinking, and the patient confirmed this.  She called EMS after he had multiple episodes of shaking and staring off and not responding to her.  She also said that he has been responding to things that are not there and actively hallucinating.  The patient reported that earlier today that he had gone since the previous day without drinking.  He denies having any alcohol since leaving the emergency department, but his wife said that he had some more alcohol after he left but she does not know how much.  No history of trauma.  The patient denies any pain.     Physical Exam   Triage Vital Signs: ED Triage Vitals  Encounter Vitals Group     BP 08/30/23 0218 120/76     Girls Systolic BP Percentile --      Girls Diastolic BP Percentile --      Boys Systolic BP Percentile --      Boys Diastolic BP Percentile --      Pulse Rate 08/30/23 0218 88     Resp 08/30/23 0218 18     Temp 08/30/23 0218 97.8 F (36.6 C)     Temp Source 08/30/23 0218 Oral     SpO2 08/30/23 0218 95 %     Weight --      Height --      Head Circumference --      Peak Flow --      Pain Score 08/30/23 0219 0     Pain Loc --      Pain Education --      Exclude from Growth Chart --     Most recent vital signs: Vitals:   08/30/23 0218 08/30/23 0230  BP: 120/76 120/76  Pulse: 88 82  Resp: 18   Temp: 97.8 F (36.6 C)   SpO2: 95%     General: Patient is disheveled and appears chronically ill and is somewhat altered, slow to respond but will do so with repeated  questioning.  Pupils are equal and reactive. CV:  Good peripheral perfusion.  Regular rate and rhythm. Resp:  Normal effort.  No accessory muscle usage nor intercostal retractions.  Lungs clear to auscultation. Abd:  No distention. No tenderness to palpation throughout the abdomen.     ED Results / Procedures / Treatments   Labs (all labs ordered are listed, but only abnormal results are displayed) Labs Reviewed  COMPREHENSIVE METABOLIC PANEL WITH GFR - Abnormal; Notable for the following components:      Result Value   Potassium 3.0 (*)    BUN 5 (*)    Calcium  8.1 (*)    Albumin 3.2 (*)    AST 85 (*)    All other components within normal limits  CBC WITH DIFFERENTIAL/PLATELET - Abnormal; Notable for the following components:   RBC 3.76 (*)    Hemoglobin 12.8 (*)    HCT 38.1 (*)    MCV 101.3 (*)    Platelets 88 (*)  All other components within normal limits  ETHANOL - Abnormal; Notable for the following components:   Alcohol, Ethyl (B) 278 (*)    All other components within normal limits  MAGNESIUM   PHOSPHORUS  CBG MONITORING, ED     EKG  ED ECG REPORT I, Darleene Dome, the attending physician, personally viewed and interpreted this ECG.  Date: 08/30/2023 EKG Time: 02 15 Rate: 94 Rhythm: normal sinus rhythm QRS Axis: normal Intervals: QTc 478 ms, otherwise unremarkable ST/T Wave abnormalities: Non-specific ST segment / T-wave changes, but no clear evidence of acute ischemia. Narrative Interpretation: no definitive evidence of acute ischemia; does not meet STEMI criteria.    RADIOLOGY See ED course for details   PROCEDURES:  Critical Care performed: Yes, see critical care procedure note(s)  .Critical Care  Performed by: Dome Darleene, MD Authorized by: Dome Darleene, MD   Critical care provider statement:    Critical care time (minutes):  30   Critical care time was exclusive of:  Separately billable procedures and treating other patients   Critical  care was necessary to treat or prevent imminent or life-threatening deterioration of the following conditions:  Toxidrome (complicated alcohol withdrawal)   Critical care was time spent personally by me on the following activities:  Development of treatment plan with patient or surrogate, evaluation of patient's response to treatment, examination of patient, obtaining history from patient or surrogate, ordering and performing treatments and interventions, ordering and review of laboratory studies, ordering and review of radiographic studies, pulse oximetry, re-evaluation of patient's condition and review of old charts .1-3 Lead EKG Interpretation  Performed by: Dome Darleene, MD Authorized by: Dome Darleene, MD     Interpretation: normal     ECG rate:  80   ECG rate assessment: normal     Rhythm: sinus rhythm     Ectopy: none     Conduction: normal       IMPRESSION / MDM / ASSESSMENT AND PLAN / ED COURSE  I reviewed the triage vital signs and the nursing notes.                              Differential diagnosis includes, but is not limited to, alcohol withdrawal, intracranial hemorrhage, neoplasm, electrolyte or metabolic abnormality, alcoholic ketoacidosis  Patient's presentation is most consistent with acute presentation with potential threat to life or bodily function.  Labs/studies ordered: CBC with differential, ethanol level, phosphorus, magnesium , CMP, CT head  Interventions/Medications given:  Medications  LORazepam  (ATIVAN ) injection 0-4 mg ( Intravenous Not Given 08/30/23 0247)    Or  LORazepam  (ATIVAN ) tablet 0-4 mg ( Oral See Alternative 08/30/23 0247)  LORazepam  (ATIVAN ) injection 0-4 mg (has no administration in time range)    Or  LORazepam  (ATIVAN ) tablet 0-4 mg (has no administration in time range)  thiamine  (VITAMIN B1) tablet 100 mg (has no administration in time range)    Or  thiamine  (VITAMIN B1) injection 100 mg (has no administration in time range)  potassium  chloride 10 mEq in 100 mL IVPB (10 mEq Intravenous New Bag/Given 08/30/23 0339)  magnesium  sulfate IVPB 2 g 50 mL (2 g Intravenous New Bag/Given 08/30/23 0341)  PHENObarbital (LUMINAL) injection 130 mg (130 mg Intravenous Given 08/30/23 0256)  potassium chloride  SA (KLOR-CON  M) CR tablet 40 mEq (40 mEq Oral Given 08/30/23 0334)    (Note:  hospital course my include additional interventions and/or labs/studies not listed above.)   History very consistent  with alcohol withdrawal seizure.  Of note, his alcohol level is elevated tonight indicating he probably did drink alcohol after going home but the timing is unclear.  He has a history of complicated withdrawal and has already been seen and discharged once from the emergency department.  At this point it is reasonable and appropriate to treat him and admit him to the hospital for management of his complicated withdrawal symptoms.  I ordered phenobarbital 130 mg IV as documented above, as well as electrolyte repletion of potassium and magnesium .  He is placed on CIWA protocol.  I ordered a CT head to rule out intracranial injury and him awaiting those results and then will consult the hospitalist team for admission.  The patient is on the cardiac monitor to evaluate for evidence of arrhythmia and/or significant heart rate changes.   Clinical Course as of 08/30/23 0404  Wed Aug 30, 2023  0348 I independently viewed and interpreted the patient's head CT and I see no evidence of intracranial hemorrhage nor mass.  Awaiting official radiology report, but I see no evidence of an emergent condition. [CF]  [id number] I am consulting the hospitalist team for admission.   [CF]  0403 I consulted by phone with the admitting hospitalist, and they will admit the patient - Dr. Lawence. [CF]    Clinical Course User Index [CF] Gordan Huxley, MD     FINAL CLINICAL IMPRESSION(S) / ED DIAGNOSES   Final diagnoses:  Alcohol withdrawal seizure with complication [facility])  Delirium  tremens (HCC)     Rx / DC Orders   ED Discharge Orders     None        Note:  This document was prepared using Dragon voice recognition software and may include unintentional dictation errors.   Gordan Huxley, MD 08/30/23 [phone]

## 2023-08-30 NOTE — H&P (Addendum)
 History and Physical    Patient: Levi Mejia FMW:[id number] DOB: 10/06/1970 DOA: 08/30/2023 DOS: the patient was seen and examined on 08/30/2023 PCP: Pcp, No  Patient coming from: Home-lives with wife  Chief Complaint:  Chief Complaint  Patient presents with   Seizures   HPI: Levi Mejia is a 53 y.o. male with medical history significant of severe alcoholism, prior abdominal ultrasound reveals significant hepatic steatosis.  Patient was brought in by EMS for evaluation of multiple seizures noted at home by patient's wife.  Patient attempted to self detox at home by withdrawing alcohol but was unsuccessful.  Because of increasing withdrawal symptoms he did drink some alcohol but apparently did not drink enough to suppress seizure activity.  Upon his initial evaluation in the ED he did physician documented he was slow to respond but was able to answer questions.  Otherwise exam was unremarkable.  No seizure activity noted while in the ED.  Labs revealed a low potassium level and an elevated AST.  Alcohol level was 278, platelets were 88,000.  CT head without evidence of acute intracranial abnormality.  There was also stable chronic microvascular ischemic disease and an incidental finding of opacified right maxillary sinus and anterior right ethmoid air cells.  Hospitalist service was asked to evaluate the patient for admission.  During my questioning the patient he confirmed very high intake of liquor daily stating he drinks 1/5.  He reports about 2 days ago he went in for inpatient 30-day rehab but essentially resumed alcohol after discharge.  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  Review of Systems: As mentioned in the history of present illness. All other systems reviewed and are negative.  Currently patient is denying chest pain, headache, visual disturbances, shortness of breath, numbness or  tingling of any extremities   Past Medical History:  Diagnosis Date   Alcohol abuse    Attention deficit disorder without mention of hyperactivity    HTN (hypertension)    Hyperlipidemia 07/03/2022   ICH (intracerebral hemorrhage) (HCC)    Seizures (HCC)    Stroke [facility])    Past Surgical History:  Procedure Laterality Date   ESOPHAGOGASTRODUODENOSCOPY (EGD) WITH PROPOFOL  N/A 07/04/2022   Procedure: ESOPHAGOGASTRODUODENOSCOPY (EGD) WITH PROPOFOL ;  Surgeon: Unk Corinn Skiff, MD;  Location: [facility] ENDOSCOPY;  Service: Endoscopy;  Laterality: N/A;   Hand tendon reattachment  02/28/1990   Right hand   toe reattachment  02/28/1994   Right 1st toe   Social History:  reports that he has never smoked. He has never used smokeless tobacco. He reports current alcohol use. He reports that he does not currently use drugs after having used the following drugs: Marijuana.  No Active Allergies  Family History  Problem Relation Age of Onset   Coronary artery disease Mother 42       Stent   Cancer Mother        Bone   Hypertension Mother    Heart failure Father    Heart attack Father 21   Hypertension Father    Alcohol abuse Father    Hypertension Brother    Diabetes Neg Hx    Prostate cancer Neg Hx    Colon cancer Neg Hx    Heart disease Neg Hx     Prior to Admission medications   Medication Sig Start Date End Date Taking? Authorizing Provider  aspirin  EC 81 MG tablet Take 1 tablet (81  mg total) by mouth daily. Patient not taking: Reported on 06/26/2023 04/08/22   Alexander, Natalie, DO  diclofenac  Sodium (VOLTAREN ) 1 % GEL Apply 4 g topically 3 (three) times daily as needed. Patient not taking: Reported on 06/26/2023 08/01/22   Dineen Rollene MATSU, FNP  feeding supplement (ENSURE ENLIVE / ENSURE PLUS) LIQD Take 237 mLs by mouth 3 (three) times daily between meals. 10/19/22   Fausto Sor A, DO  folic acid  (FOLVITE ) 1 MG tablet Take 1 tablet (1 mg total) by mouth daily. Patient not  taking: Reported on 06/26/2023 08/18/22   Hope Merle, MD  levETIRAcetam (KEPPRA) 500 MG tablet Take 500 mg by mouth 2 (two) times daily. Patient not taking: Reported on 06/26/2023 03/22/23   [provider]  lisinopril  (ZESTRIL ) 5 MG tablet Take 5 mg by mouth daily. Patient not taking: Reported on 08/30/2023    [provider]  lisinopril  (ZESTRIL ) 5 MG tablet Take 1 tablet (5 mg total) by mouth daily. Patient not taking: Reported on 08/30/2023 06/26/23 09/24/23  Jacolyn Pae, MD  losartan  (COZAAR ) 25 MG tablet Take 0.5 tablets (12.5 mg total) by mouth daily. Patient not taking: Reported on 06/26/2023 08/01/22   Dineen Rollene MATSU, FNP  LUTEIN PO Take 1 tablet by mouth every morning. Patient not taking: Reported on 06/26/2023 06/17/23   [provider]  Multiple Vitamin (MULTIVITAMIN WITH MINERALS) TABS tablet Take 1 tablet by mouth daily. Patient not taking: Reported on 06/26/2023 04/09/22   Alexander, Natalie, DO  ondansetron  (ZOFRAN -ODT) 4 MG disintegrating tablet Take 1 tablet (4 mg total) by mouth every 8 (eight) hours as needed for nausea or vomiting. Patient not taking: Reported on 06/26/2023 12/01/22   Willo Dunnings, MD  pantoprazole  (PROTONIX ) 40 MG tablet Take 1 tablet (40 mg total) by mouth 2 (two) times daily before a meal. Patient not taking: Reported on 06/26/2023 10/06/22   Hope Merle, MD  potassium chloride  SA (KLOR-CON  M) 20 MEQ tablet Take 1 tablet (20 mEq total) by mouth 2 (two) times daily for 5 days. 12/01/22 12/06/22  Willo Dunnings, MD  thiamine  (VITAMIN B1) 100 MG tablet Take 100 mg by mouth daily. Patient not taking: Reported on 06/26/2023 07/04/22   [provider]    Physical Exam: Vitals:   08/30/23 0730 08/30/23 0826 08/30/23 0930 08/30/23 0945  BP: 125/88 102/74 129/84   Pulse: 78 (!) 106 86   Resp: 15  (!) 21 16  Temp:      TempSrc:      SpO2: 99%  100%    Constitutional: NAD, calm, comfortable Respiratory: clear to auscultation  bilaterally, no wheezing, no crackles. Normal respiratory effort. No accessory muscle use. RA Cardiovascular: Regular rate and rhythm, no murmurs / rubs / gallops. No extremity edema. 2+ pedal pulses. .  Abdomen: no RUQ tenderness, no masses palpated. No obvious hepatosplenomegaly. Bowel sounds positive.  Musculoskeletal: no clubbing / cyanosis. No joint deformity upper and lower extremities. Good ROM, no contractures. Normal muscle tone.  Skin: no rashes, lesions, ulcers. No induration Neurologic: CN 2-12 grossly intact. Sensation intact, Strength 5/5 x all 4 extremities.  Psychiatric: Normal judgment and insight. Alert and oriented x 3. Normal mood.   Data Reviewed:  Sodium 144, potassium 3.0, CO2 26, glucose 75, BUN 5, creatinine 0.82, calcium  8.1, anion gap 13, phosphorus 3.2, magnesium  1.8, albumin 3.2, AST 85  Troponin normal  WBC 4300 with normal differential, hemoglobin 12.8, MCV 101.3  Diagnostic imaging as above  Assessment and Plan: Acute alcohol  withdrawal Alcohol withdrawal seizure Patient not taking previously prescribed Keppra No further seizure since arrival-continue seizure precautions as well as acute delirium precautions Continue Ativan  CIWA withdrawal protocol Given still intoxicated based on alcohol level of 278 he is at high risk for very severe alcohol withdrawal symptoms therefore have added Librium  10 mg 3 times daily to his regimen-if symptoms worsen on this schedule of medications he may need to transfer to the ICU and be started on a Precedex infusion Most recent CIWA score appropriate at 3 [facility] consulted for outpatient alcoholism treatment resources In review of notes both from [facility] health system as well as outside hospital systems patient has had repeated presentations to the ED and admissions for seizure activity  Acute hypokalemia Was given potassium 40 mill equivalents once at 3:34 AM-repeat electrolyte panel at 12 noon Magnesium  slightly low at 1.8 and  has history of hypomagnesemia secondary to alcoholism Will start oral magnesium  supplementation while here and go ahead and give IV bolus of 2 g now Follow magnesium  levels daily Phosphate levels were normal  Hepatic steatosis with thrombocytopenia AST elevated consistent with current mild alcoholic hepatitis CT abdomen and pelvis from October 2024 notes severe hepatic steatosis without any hepatic masses Platelets 88,000 therefore no pharmacological DVT prophylaxis Continue to monitor both LFTs and platelets  Macrocytic anemia/vitamin D  deficiency Hemoglobin slightly low at 12.8 and MCV 101.3 Anemia panel April 2024 showed an iron of 242, TIBC of 269, percent sat of 93, ferritin of 1557 B12 was 498 Thiamine  was low at 7-this is currently being replaced per CIWA protocol Vitamin D  was also low at 18.2-will add replacement  Hypertension Not taking previously prescribed lisinopril   History of CVA Not taking aspirin  at home as prescribed   Advance Care Planning:   Code Status: Full Code   VTE prophylaxis: Given thrombocytopenia less than 100,000 will utilize SCDs  Consults: None  Family Communication: Patient only  Severity of Illness: The appropriate patient status for this patient is INPATIENT. Inpatient status is judged to be reasonable and necessary in order to provide the required intensity of service to ensure the patient's safety. The patient's presenting symptoms, physical exam findings, and initial radiographic and laboratory data in the context of their chronic comorbidities is felt to place them at high risk for further clinical deterioration. Furthermore, it is not anticipated that the patient will be medically stable for discharge from the hospital within 2 midnights of admission.   * I certify that at the point of admission it is my clinical judgment that the patient will require inpatient hospital care spanning beyond 2 midnights from the point of admission due to high  intensity of service, high risk for further deterioration and high frequency of surveillance required.*  Author: Isaiah Lever, NP 08/30/2023 10:15 AM  For on call review [email or web address].

## 2023-08-31 DIAGNOSIS — F10930 Alcohol use, unspecified with withdrawal, uncomplicated: Secondary | ICD-10-CM

## 2023-08-31 LAB — COMPREHENSIVE METABOLIC PANEL WITH GFR
ALT: 31 U/L (ref 0–44)
AST: 93 U/L — ABNORMAL HIGH (ref 15–41)
Albumin: 3.1 g/dL — ABNORMAL LOW (ref 3.5–5.0)
Alkaline Phosphatase: 60 U/L (ref 38–126)
Anion gap: 5 (ref 5–15)
BUN: 7 mg/dL (ref 6–20)
CO2: 27 mmol/L (ref 22–32)
Calcium: 8.2 mg/dL — ABNORMAL LOW (ref 8.9–10.3)
Chloride: 101 mmol/L (ref 98–111)
Creatinine, Ser: 0.84 mg/dL (ref 0.61–1.24)
GFR, Estimated: >60 mL/min (ref >60)
Glucose, Bld: 103 mg/dL — ABNORMAL HIGH (ref 70–99)
Potassium: 3.8 mmol/L (ref 3.5–5.1)
Sodium: 134 mmol/L — ABNORMAL LOW (ref 135–145)
Total Bilirubin: 2.4 mg/dL — ABNORMAL HIGH (ref 0.0–1.2)
Total Protein: 6.3 g/dL — ABNORMAL LOW (ref 6.5–8.1)

## 2023-08-31 LAB — CBC
HCT: 35.1 % — ABNORMAL LOW (ref 39.0–52.0)
Hemoglobin: 12.2 g/dL — ABNORMAL LOW (ref 13.0–17.0)
MCH: 34.7 pg — ABNORMAL HIGH (ref 26.0–34.0)
MCHC: 34.8 g/dL (ref 30.0–36.0)
MCV: 99.7 fL (ref 80.0–100.0)
Platelets: 73 10*3/uL — ABNORMAL LOW (ref 150–400)
RBC: 3.52 MIL/uL — ABNORMAL LOW (ref 4.22–5.81)
RDW: 14.1 % (ref 11.5–15.5)
WBC: 4.8 10*3/uL (ref 4.0–10.5)
nRBC: 0 % (ref 0.0–0.2)

## 2023-08-31 LAB — MAGNESIUM: Magnesium: 2 mg/dL (ref 1.7–2.4)

## 2023-08-31 MED ORDER — HYDROXYZINE HCL 10 MG PO TABS
10.0000 mg | ORAL_TABLET | Freq: Three times a day (TID) | ORAL | Status: DC | PRN
  Administered 2023-08-31: 10 mg via ORAL
  Filled 2023-08-31 (×2): qty 1

## 2023-08-31 MED ORDER — CHLORDIAZEPOXIDE HCL 5 MG PO CAPS
5.0000 mg | ORAL_CAPSULE | Freq: Three times a day (TID) | ORAL | Status: DC
  Administered 2023-08-31 – 2023-09-01 (×3): 5 mg via ORAL
  Filled 2023-08-31 (×3): qty 1

## 2023-08-31 MED ORDER — ACETAMINOPHEN 325 MG PO TABS
650.0000 mg | ORAL_TABLET | Freq: Once | ORAL | Status: AC
  Administered 2023-08-31: 650 mg via ORAL
  Filled 2023-08-31: qty 2

## 2023-08-31 NOTE — Progress Notes (Signed)
 PROGRESS NOTE  [facility]    DOB: 1970/05/10, 53 y.o.  FMW:[id number]    Code Status: Full Code   DOA: 08/30/2023   LOS: 1   Brief hospital course  Levi Mejia is a 53 y.o. male with a PMH significant for alcoholism, prior abdominal ultrasound reveals significant hepatic steatosis. Patient was brought in by EMS for evaluation of multiple seizures noted at home by patient's wife. Patient attempted to self detox at home by withdrawing alcohol but was unsuccessful. Because of increasing withdrawal symptoms he did drink some alcohol but apparently did not drink enough to suppress seizure activity. Upon his initial evaluation in the ED he did physician documented he was slow to respond but was able to answer questions. Otherwise exam was unremarkable. No seizure activity noted while in the ED. Labs revealed a low potassium level and an elevated AST. Alcohol level was 278, platelets were 88,000. CT head without evidence of acute intracranial abnormality. There was also stable chronic microvascular ischemic disease and an incidental finding of opacified right maxillary sinus and anterior right ethmoid air cells.   They were initially treated with phenobarbital and ativan .   Patient was admitted to medicine service for further workup and management of alcohol withdrawal as outlined in detail below.  08/31/23 -stable  Assessment & Plan  Principal Problem:   Delirium tremens (HCC)  Acute alcohol withdrawal- alcohol level of 278 Alcohol withdrawal seizure- CIWA scores have been <5 today.  Patient not taking previously prescribed Keppra. No further seizure since arrival -continue seizure precautions as well as acute delirium precautions - Continue Ativan  CIWA withdrawal protocol - T[facility] consulted for outpatient alcoholism treatment resources - wean from librium  - continue ativan  sliding scale with taper   Acute hypokalemia- resolved   Hepatic steatosis with  thrombocytopenia AST elevated consistent with current mild alcoholic hepatitis CT abdomen and pelvis from October 2024 notes severe hepatic steatosis without any hepatic masses Platelets 88,000 therefore no pharmacological DVT prophylaxis Continue to monitor both LFTs and platelets   Macrocytic anemia/vitamin D  deficiency Hemoglobin slightly low at 12.8 and MCV 101.3 Anemia panel April 2024 showed an iron of 242, TIBC of 269, percent sat of 93, ferritin of 1557 B12 was 498 Thiamine  was low at 7-this is currently being replaced per CIWA protocol Vitamin D  was also low at 18.2-will add replacement   Hypertension- well controlled. Not medicated   History of CVA Not taking aspirin  at home as prescribed. Consider restarting  Body mass index is 23.14 kg/m.  VTE ppx: SCDs Start: 08/30/23 0909  Diet:     Diet   Diet regular Room service appropriate? Yes; Fluid consistency: Thin   Consultants: None   Subjective 08/31/23    Pt reports feeling exhausted. Denies headache, tremor. Has some nausea but tolerating food without vomiting.    Objective  Blood pressure 124/89, pulse 89, temperature 98.8 F (37.1 C), resp. rate 16, height 5' 10 (1.778 m), weight 73.2 kg, SpO2 94%.  Intake/Output Summary (Last 24 hours) at 08/31/2023 0821 Last data filed at 08/30/2023 2207 Gross per 24 hour  Intake 246 ml  Output 800 ml  Net -554 ml   Filed Weights   08/31/23 0209  Weight: 73.2 kg    Physical Exam:  General: awake, alert, NAD HEENT: atraumatic, clear conjunctiva, anicteric sclera, MMM, hearing grossly normal Respiratory: normal respiratory effort. Cardiovascular: quick capillary refill, normal S1/S2, RRR, no JVD, murmurs Gastrointestinal: soft, NT, ND Nervous: A&O x3. no gross focal neurologic deficits,  normal speech Extremities: moves all equally, no edema, normal tone Skin: dry, intact, normal temperature, normal color. No rashes, lesions or ulcers on exposed skin Psychiatry:  depressed mood  Labs   I have personally reviewed the following labs and imaging studies CBC    Component Value Date/Time   WBC 4.8 08/31/2023 0350   RBC 3.52 (L) 08/31/2023 0350   HGB 12.2 (L) 08/31/2023 0350   HGB 18.1 (H) 03/25/2014 0729   HCT 35.1 (L) 08/31/2023 0350   HCT 52.8 (H) 03/25/2014 0729   PLT 73 (L) 08/31/2023 0350   PLT 290 03/25/2014 0729   MCV 99.7 08/31/2023 0350   MCV 95 03/25/2014 0729   MCH 34.7 (H) 08/31/2023 0350   MCHC 34.8 08/31/2023 0350   RDW 14.1 08/31/2023 0350   RDW 13.4 03/25/2014 0729   LYMPHSABS 1.5 08/30/2023 0223   MONOABS 0.4 08/30/2023 0223   EOSABS 0.4 08/30/2023 0223   BASOSABS 0.1 08/30/2023 0223      Latest Ref Rng & Units 08/31/2023    3:50 AM 08/30/2023   12:14 PM 08/30/2023    2:23 AM  BMP  Glucose 70 - 99 mg/dL 896  93  75   BUN 6 - 20 mg/dL 7  <5  5   Creatinine 9.38 - 1.24 mg/dL 9.15  9.19  9.17   Sodium 135 - 145 mmol/L 134  141  144   Potassium 3.5 - 5.1 mmol/L 3.8  4.1  3.0   Chloride 98 - 111 mmol/L 101  105  105   CO2 22 - 32 mmol/L 27  26  26    Calcium  8.9 - 10.3 mg/dL 8.2  7.9  8.1     CT Head Wo Contrast Result Date: 08/30/2023 CLINICAL DATA:  Seizure, new-onset, no history of trauma likely ETOH withdrawal seizures EXAM: CT HEAD WITHOUT CONTRAST TECHNIQUE: Contiguous axial images were obtained from the base of the skull through the vertex without intravenous contrast. RADIATION DOSE REDUCTION: This exam was performed according to the departmental dose-optimization program which includes automated exposure control, adjustment of the mA and/or kV according to patient size and/or use of iterative reconstruction technique. COMPARISON:  CT head April 27, 25. FINDINGS: Brain: No evidence of acute infarction, hemorrhage, hydrocephalus, extra-axial collection or mass lesion/mass effect. Similar patchy white matter hypodensities including remote lacunar infarcts in the bifrontal white matter, compatible with chronic microvascular  ischemic disease. Vascular: No hyperdense vessel. Skull: No acute fracture. Sinuses/Orbits: Opacified right maxillary sinus and anterior right ethmoid air cells. Other: No mastoid effusions. IMPRESSION: 1. No evidence of acute intracranial abnormality. 2. Similar chronic microvascular ischemic disease. 3. Opacified right maxillary sinus and anterior right ethmoid air cells. Electronically Signed   By: Gilmore GORMAN Molt M.D.   On: 08/30/2023 04:02   DG Chest 2 View Result Date: 08/29/2023 CLINICAL DATA:  Chest pain EXAM: CHEST - 2 VIEW COMPARISON:  X-ray 02/08/2023.  CT scan 06/25/2023. FINDINGS: No consolidation, pneumothorax or effusion. No edema. Normal cardiopericardial silhouette. IMPRESSION: No acute cardiopulmonary disease. Electronically Signed   By: Ranell Bring M.D.   On: 08/29/2023 16:41    Disposition Plan & Communication  Patient status: Inpatient  Admitted From: Home Planned disposition location: Home Anticipated discharge date: 7/5 pending alcohol withdrawal   Family Communication: none    Author: Marien LITTIE Piety, DO [facility] 08/31/2023, 8:21 AM   Available by Epic secure chat 7AM-7PM. If 7PM-7AM, please contact night-coverage.  [facility] contact information found on [email or web address].

## 2023-08-31 NOTE — Plan of Care (Signed)

## 2023-08-31 NOTE — ED Notes (Signed)
 ED TO INPATIENT HANDOFF REPORT  ED Nurse Name and Phone #: [phone]  S Name/Age/Gender Levi Mejia 53 y.o. male Room/Bed: ED38A/ED38A  Code Status   Code Status: Full Code  Home/SNF/Other Home Patient oriented to: self, place, time, and situation Is this baseline? Yes   Triage Complete: Triage complete  Chief Complaint Delirium tremens [facility]) [F10.931]  Triage Note BIB EMS for DT's and alcohol induced seizures. Seen here earlier for same. Wife called out for approximately 5 episodes of seizure like activity characterized by the patient stiffening up. EMS states its getting more frequent. Wife states that the patient did express SI, but now patient denies any SI.   Allergies No Active Allergies  Level of Care/Admitting Diagnosis ED Disposition     ED Disposition  Admit   Condition  --   Comment  Hospital Area: [facility] [[id number]] Level of Care: Progressive [102] Admit to Progressive based on following criteria: Other see comments Comments: DT Covid Evaluation: Asymptomatic - no recent exposure (last 10 days) testing  not required Diagnosis: Delirium tremens [facility]) [[id number]] Admitting Physician: LAWENCE MADISON LABOR [[id number]] Attending Physician: LAWENCE MADISON LABOR [[id number]] Certification:: I certify this patient will need inpatient services for at least 2 midnights Expecte d Medical Readiness: 09/01/2023          B Medical/Surgery History Past Medical History:  Diagnosis Date   Alcohol abuse    Attention deficit disorder without mention of hyperactivity    HTN (hypertension)    Hyperlipidemia 07/03/2022   ICH (intracerebral hemorrhage) (HCC)    Seizures (HCC)    Stroke [facility])    Past Surgical History:  Procedure Laterality Date   ESOPHAGOGASTRODUODENOSCOPY (EGD) WITH PROPOFOL  N/A 07/04/2022   Procedure: ESOPHAGOGASTRODUODENOSCOPY (EGD) WITH PROPOFOL ;  Surgeon: Unk Corinn Skiff, MD;  Location: [facility] ENDOSCOPY;  Service: Endoscopy;   Laterality: N/A;   Hand tendon reattachment  02/28/1990   Right hand   toe reattachment  02/28/1994   Right 1st toe     A IV Location/Drains/Wounds Patient Lines/Drains/Airways Status     Active Line/Drains/Airways     Name Placement date Placement time Site Days   Peripheral IV 08/30/23 18 G 1 Anterior;Left Forearm 08/30/23  0213  Forearm  1            Intake/Output Last 24 hours  Intake/Output Summary (Last 24 hours) at 08/31/2023 0126 Last data filed at 08/30/2023 2207 Gross per 24 hour  Intake 246 ml  Output 1225 ml  Net -979 ml    Labs/Imaging Results for orders placed or performed during the hospital encounter of 08/30/23 (from the past 48 hours)  Comprehensive metabolic panel     Status: Abnormal   Collection Time: 08/30/23  2:23 AM  Result Value Ref Range   Sodium 144 135 - 145 mmol/L   Potassium 3.0 (L) 3.5 - 5.1 mmol/L   Chloride 105 98 - 111 mmol/L   CO2 26 22 - 32 mmol/L   Glucose, Bld 75 70 - 99 mg/dL    Comment: Glucose reference range applies only to samples taken after fasting for at least 8 hours.   BUN 5 (L) 6 - 20 mg/dL   Creatinine, Ser 9.17 0.61 - 1.24 mg/dL   Calcium  8.1 (L) 8.9 - 10.3 mg/dL   Total Protein 6.5 6.5 - 8.1 g/dL   Albumin 3.2 (L) 3.5 - 5.0 g/dL   AST 85 (H) 15 - 41 U/L   ALT 30 0 - 44 U/L   Alkaline  Phosphatase 59 38 - 126 U/L   Total Bilirubin 0.8 0.0 - 1.2 mg/dL   GFR, Estimated >39 >39 mL/min    Comment: (NOTE) Calculated using the CKD-EPI Creatinine Equation (2021)    Anion gap 13 5 - 15    Comment: Performed at [facility], [address] [place], [place] [address]  CBC with Differential/Platelet     Status: Abnormal   Collection Time: 08/30/23  2:23 AM  Result Value Ref Range   WBC 4.3 4.0 - 10.5 K/uL   RBC 3.76 (L) 4.22 - 5.81 MIL/uL   Hemoglobin 12.8 (L) 13.0 - 17.0 g/dL   HCT 61.8 (L) 60.9 - 47.9 %   MCV 101.3 (H) 80.0 - 100.0 fL   MCH 34.0 26.0 - 34.0 pg   MCHC 33.6 30.0 - 36.0 g/dL   RDW 85.4  88.4 - 84.4 %   Platelets 88 (L) 150 - 400 K/uL    Comment: Immature Platelet Fraction may be clinically indicated, consider ordering this additional test [id number]    nRBC 0.0 0.0 - 0.2 %   Neutrophils Relative % 44 %   Neutro Abs 1.9 1.7 - 7.7 K/uL   Lymphocytes Relative 34 %   Lymphs Abs 1.5 0.7 - 4.0 K/uL   Monocytes Relative 10 %   Monocytes Absolute 0.4 0.1 - 1.0 K/uL   Eosinophils Relative 10 %   Eosinophils Absolute 0.4 0.0 - 0.5 K/uL   Basophils Relative 2 %   Basophils Absolute 0.1 0.0 - 0.1 K/uL   WBC Morphology MORPHOLOGY UNREMARKABLE    RBC Morphology MORPHOLOGY UNREMARKABLE    Smear Review Normal platelet morphology    Immature Granulocytes 0 %   Abs Immature Granulocytes 0.01 0.00 - 0.07 K/uL    Comment: Performed at [facility], [address] [place], [place] [address]  Magnesium      Status: None   Collection Time: 08/30/23  2:23 AM  Result Value Ref Range   Magnesium  1.8 1.7 - 2.4 mg/dL    Comment: Performed at [facility], [address] [place], [place] [address]  Phosphorus     Status: None   Collection Time: 08/30/23  2:23 AM  Result Value Ref Range   Phosphorus 3.2 2.5 - 4.6 mg/dL    Comment: Performed at [facility], [address] [place], [place] [address]  Ethanol     Status: Abnormal   Collection Time: 08/30/23  2:23 AM  Result Value Ref Range   Alcohol, Ethyl (B) 278 (H) <15 mg/dL    Comment: (NOTE) For medical purposes only. Performed at [facility], [address] [place], [place] [address]   CBG monitoring, ED     Status: None   Collection Time: 08/30/23  3:02 AM  Result Value Ref Range   Glucose-Capillary 92 70 - 99 mg/dL    Comment: Glucose reference range applies only to samples taken after fasting for at least 8 hours.  HIV Antibody (routine testing w rflx)     Status: None   Collection Time: 08/30/23  9:59 AM  Result Value Ref Range   HIV Screen 4th Generation wRfx Non Reactive Non  Reactive    Comment: Performed at [facility], [address], [place], [place] [address]  Basic metabolic panel     Status: Abnormal   Collection Time: 08/30/23 12:14 PM  Result Value Ref Range   Sodium 141 135 - 145 mmol/L   Potassium 4.1 3.5 - 5.1  mmol/L   Chloride 105 98 - 111 mmol/L   CO2 26 22 - 32 mmol/L   Glucose, Bld 93 70 - 99 mg/dL    Comment: Glucose reference range applies only to samples taken after fasting for at least 8 hours.   BUN <5 (L) 6 - 20 mg/dL   Creatinine, Ser 9.19 0.61 - 1.24 mg/dL   Calcium  7.9 (L) 8.9 - 10.3 mg/dL   GFR, Estimated >39 >39 mL/min    Comment: (NOTE) Calculated using the CKD-EPI Creatinine Equation (2021)    Anion gap 10 5 - 15    Comment: Performed at [facility], [address] [place], [place] [address]   CT Head Wo Contrast Result Date: 08/30/2023 CLINICAL DATA:  Seizure, new-onset, no history of trauma likely ETOH withdrawal seizures EXAM: CT HEAD WITHOUT CONTRAST TECHNIQUE: Contiguous axial images were obtained from the base of the skull through the vertex without intravenous contrast. RADIATION DOSE REDUCTION: This exam was performed according to the departmental dose-optimization program which includes automated exposure control, adjustment of the mA and/or kV according to patient size and/or use of iterative reconstruction technique. COMPARISON:  CT head April 27, 25. FINDINGS: Brain: No evidence of acute infarction, hemorrhage, hydrocephalus, extra-axial collection or mass lesion/mass effect. Similar patchy white matter hypodensities including remote lacunar infarcts in the bifrontal white matter, compatible with chronic microvascular ischemic disease. Vascular: No hyperdense vessel. Skull: No acute fracture. Sinuses/Orbits: Opacified right maxillary sinus and anterior right ethmoid air cells. Other: No mastoid effusions. IMPRESSION: 1. No evidence of acute intracranial abnormality. 2. Similar chronic microvascular ischemic  disease. 3. Opacified right maxillary sinus and anterior right ethmoid air cells. Electronically Signed   By: Gilmore GORMAN Molt M.D.   On: 08/30/2023 04:02   DG Chest 2 View Result Date: 08/29/2023 CLINICAL DATA:  Chest pain EXAM: CHEST - 2 VIEW COMPARISON:  X-ray 02/08/2023.  CT scan 06/25/2023. FINDINGS: No consolidation, pneumothorax or effusion. No edema. Normal cardiopericardial silhouette. IMPRESSION: No acute cardiopulmonary disease. Electronically Signed   By: Ranell Bring M.D.   On: 08/29/2023 16:41    Pending Labs Unresulted Labs (From admission, onward)     Start     Ordered   08/31/23 0500  Comprehensive metabolic panel  Tomorrow morning,   R        08/30/23 0908   08/31/23 0500  CBC  Tomorrow morning,   R        08/30/23 0908   08/31/23 0500  Magnesium   Tomorrow morning,   R        08/30/23 1029            Vitals/Pain Today's Vitals   08/30/23 1959 08/30/23 2014 08/30/23 2108 08/30/23 2316  BP: 126/77 126/81 115/71 118/79  Pulse: 90 76 69 79  Resp:  19  16  Temp:    97.7 F (36.5 C)  TempSrc:    Oral  SpO2:  98%  94%  PainSc:    0-No pain    Isolation Precautions No active isolations  Medications Medications  LORazepam  (ATIVAN ) injection 0-4 mg ( Intravenous See Alternative 08/30/23 2008)    Or  LORazepam  (ATIVAN ) tablet 0-4 mg (1 mg Oral Given 08/30/23 2008)  LORazepam  (ATIVAN ) injection 0-4 mg (has no administration in time range)    Or  LORazepam  (ATIVAN ) tablet 0-4 mg (has no administration in time range)  thiamine  (VITAMIN B1) tablet 100 mg (100 mg Oral Given 08/30/23 0930)    Or  thiamine  (VITAMIN B1)  injection 100 mg ( Intravenous See Alternative 08/30/23 0930)  chlordiazePOXIDE  (LIBRIUM ) capsule 10 mg (10 mg Oral Given 08/30/23 2203)  sodium chloride  flush (NS) 0.9 % injection 3 mL (3 mLs Intravenous Given 08/30/23 2207)  magnesium  oxide (MAG-OX) tablet 400 mg (400 mg Oral Given 08/30/23 2204)  cholecalciferol (VITAMIN D3) 25 MCG (1000 UNIT) tablet 1,000  Units (1,000 Units Oral Given 08/30/23 1211)  ondansetron  (ZOFRAN -ODT) disintegrating tablet 4 mg (4 mg Oral Given 08/30/23 1559)  ondansetron  (ZOFRAN ) injection 4 mg (has no administration in time range)  PHENObarbital (LUMINAL) injection 130 mg (130 mg Intravenous Given 08/30/23 0256)  potassium chloride  SA (KLOR-CON  M) CR tablet 40 mEq (40 mEq Oral Given 08/30/23 0334)  potassium chloride  10 mEq in 100 mL IVPB (0 mEq Intravenous Stopped 08/30/23 0959)  magnesium  sulfate IVPB 2 g 50 mL (0 g Intravenous Stopped 08/30/23 0449)  magnesium  sulfate IVPB 2 g 50 mL (0 g Intravenous Stopped 08/30/23 1205)    Mobility walks     Focused Assessments Neuro Assessment Handoff:  Swallow screen pass?   Cardiac Rhythm: Normal sinus rhythm       Neuro Assessment: Within Defined Limits Neuro Checks:      Has TPA been given?  If patient is a Neuro Trauma and patient is going to OR before floor call report to 4N Charge nurse: [phone] or [phone]   R Recommendations: See Admitting Provider Note  Report given to:   Additional Notes:

## 2023-09-01 NOTE — Plan of Care (Signed)
  Problem: Education: Goal: Knowledge of General Education information will improve Description: Including pain rating scale, medication(s)/side effects and non-pharmacologic comfort measures Outcome: Progressing   Problem: Clinical Measurements: Goal: Ability to maintain clinical measurements within normal limits will improve Outcome: Progressing   Problem: Activity: Goal: Risk for activity intolerance will decrease Outcome: Progressing   Problem: Nutrition: Goal: Adequate nutrition will be maintained Outcome: Progressing   Problem: Elimination: Goal: Will not experience complications related to bowel motility Outcome: Progressing   Problem: Pain Managment: Goal: General experience of comfort will improve and/or be controlled Outcome: Progressing   Problem: Safety: Goal: Ability to remain free from injury will improve Outcome: Progressing

## 2023-09-01 NOTE — Progress Notes (Signed)
 Discharge instructions given to and reviewed with patient. Patient verbalized understanding of all discharge instructions including follow up care, changes to home medications and resources available to him.

## 2023-09-01 NOTE — Discharge Summary (Signed)
 Physician Discharge Summary   Patient: Levi Mejia MRN: [id number] DOB: 1970-07-31  Admit date:     08/30/2023  Discharge date: 09/01/23  Discharge Physician: Nena Rebel   PCP: Pcp, No   Recommendations at discharge:   Discussed with the patient regarding alcohol rehab.  Patient and his wife [name] agreed on the plan.  They will work themselves.  Discharge Diagnoses: Principal Problem:   Delirium tremens (HCC) Active Problems:   Alcohol use disorder, severe, dependence (HCC)   HTN (hypertension)   Seizure (HCC)  Resolved Problems:   * No resolved hospital problems. *  Hospital Course: 53 year old male W/PMH of alcohol use disorder, history of alcoholic seizure, hepatic steatosis, hypertension who was brought into by ambulance after had multiple seizures at home noted by patient's wife.  Patient tried to attempt self detox at home by withdrawing alcohol but was unsuccessful.  The hospitalist he was treated with CIWA protocol.  His symptoms improved.  He felt better denied having any seizures in the hospital.  He did not have any tremors.  He was alert awake and oriented.  I had discussed with patient and patient's wife [name] over the phone and they agreed to take him home.  Discussed with case manager/social worker they have also given him resources for detox.  Patient's wife advised that they have done that in the past and they will not do at home at this point.  Patient will be discharged home in a stable condition on his home medications.  He was prescribed Keppra in the past and that has been continued at this point.  Assessment and Plan: As mentioned above patient was treated for alcohol withdrawal seizures, hypokalemia, hypertension.  Patient did well and expressed wishes to go home patient will be discharged home today.  Patient was advised to continue his home medications and abstain from alcohol.  He agreed to do that.  He is alert awake and oriented.     Pain  control - [place]  Controlled Substance Reporting System database was reviewed. and patient was instructed, not to drive, operate heavy machinery, perform activities at heights, swimming or participation in water activities or provide baby-sitting services while on Pain, Sleep and Anxiety Medications; until their outpatient Physician has advised to do so again. Also recommended to not to take more than prescribed Pain, Sleep and Anxiety Medications.  Consultants: None Procedures performed: none  Disposition: Home Diet recommendation:  Discharge Diet Orders (From admission, onward)     Start     Ordered   09/01/23 0000  Diet - low sodium heart healthy        09/01/23 1353           Regular diet DISCHARGE MEDICATION: Allergies as of 09/01/2023   No Active Allergies      Medication List     STOP taking these medications    LUTEIN PO       TAKE these medications    aspirin  EC 81 MG tablet Take 1 tablet (81 mg total) by mouth daily.   diclofenac  Sodium 1 % Gel Commonly known as: VOLTAREN  Apply 4 g topically 3 (three) times daily as needed.   feeding supplement Liqd Take 237 mLs by mouth 3 (three) times daily between meals.   folic acid  1 MG tablet Commonly known as: FOLVITE  Take 1 tablet (1 mg total) by mouth daily.   levETIRAcetam 500 MG tablet Commonly known as: KEPPRA Take 500 mg by mouth 2 (two) times daily.  lisinopril  5 MG tablet Commonly known as: ZESTRIL  Take 1 tablet (5 mg total) by mouth daily. What changed: Another medication with the same name was removed. Continue taking this medication, and follow the directions you see here.   losartan  25 MG tablet Commonly known as: COZAAR  Take 0.5 tablets (12.5 mg total) by mouth daily.   multivitamin with minerals Tabs tablet Take 1 tablet by mouth daily.   ondansetron  4 MG disintegrating tablet Commonly known as: ZOFRAN -ODT Take 1 tablet (4 mg total) by mouth every 8 (eight) hours as needed for  nausea or vomiting.   pantoprazole  40 MG tablet Commonly known as: PROTONIX  Take 1 tablet (40 mg total) by mouth 2 (two) times daily before a meal.   potassium chloride  SA 20 MEQ tablet Commonly known as: KLOR-CON  M Take 1 tablet (20 mEq total) by mouth 2 (two) times daily for 5 days.   thiamine  100 MG tablet Commonly known as: VITAMIN B1 Take 100 mg by mouth daily.        Discharge Exam: Filed Weights   08/31/23 0209  Weight: 73.2 kg     Condition at discharge: good  The results of significant diagnostics from this hospitalization (including imaging, microbiology, ancillary and laboratory) are listed below for reference.   Imaging Studies: CT Head Wo Contrast Result Date: 08/30/2023 CLINICAL DATA:  Seizure, new-onset, no history of trauma likely ETOH withdrawal seizures EXAM: CT HEAD WITHOUT CONTRAST TECHNIQUE: Contiguous axial images were obtained from the base of the skull through the vertex without intravenous contrast. RADIATION DOSE REDUCTION: This exam was performed according to the departmental dose-optimization program which includes automated exposure control, adjustment of the mA and/or kV according to patient size and/or use of iterative reconstruction technique. COMPARISON:  CT head April 27, 25. FINDINGS: Brain: No evidence of acute infarction, hemorrhage, hydrocephalus, extra-axial collection or mass lesion/mass effect. Similar patchy white matter hypodensities including remote lacunar infarcts in the bifrontal white matter, compatible with chronic microvascular ischemic disease. Vascular: No hyperdense vessel. Skull: No acute fracture. Sinuses/Orbits: Opacified right maxillary sinus and anterior right ethmoid air cells. Other: No mastoid effusions. IMPRESSION: 1. No evidence of acute intracranial abnormality. 2. Similar chronic microvascular ischemic disease. 3. Opacified right maxillary sinus and anterior right ethmoid air cells. Electronically Signed   By: Gilmore GORMAN Molt M.D.   On: 08/30/2023 04:02   DG Chest 2 View Result Date: 08/29/2023 CLINICAL DATA:  Chest pain EXAM: CHEST - 2 VIEW COMPARISON:  X-ray 02/08/2023.  CT scan 06/25/2023. FINDINGS: No consolidation, pneumothorax or effusion. No edema. Normal cardiopericardial silhouette. IMPRESSION: No acute cardiopulmonary disease. Electronically Signed   By: Ranell Bring M.D.   On: 08/29/2023 16:41    Microbiology: No results found for this or any previous visit.  Labs: CBC: Recent Labs  Lab 08/29/23 1608 08/30/23 0223 08/31/23 0350  WBC 5.6 4.3 4.8  NEUTROABS 2.9 1.9  --   HGB 13.5 12.8* 12.2*  HCT 39.4 38.1* 35.1*  MCV 98.3 101.3* 99.7  PLT 112* 88* 73*   Basic Metabolic Panel: Recent Labs  Lab 08/29/23 1608 08/30/23 0223 08/30/23 1214 08/31/23 0350  NA 144 144 141 134*  K 3.5 3.0* 4.1 3.8  CL 102 105 105 101  CO2 24 26 26 27   GLUCOSE 106* 75 93 103*  BUN 6 5* <5* 7  CREATININE 0.81 0.82 0.80 0.84  CALCIUM  9.0 8.1* 7.9* 8.2*  MG 1.5* 1.8  --  2.0  PHOS  --  3.2  --   --  Liver Function Tests: Recent Labs  Lab 08/29/23 1608 08/30/23 0223 08/31/23 0350  AST 103* 85* 93*  ALT 34 30 31  ALKPHOS 67 59 60  BILITOT 1.0 0.8 2.4*  PROT 7.1 6.5 6.3*  ALBUMIN 3.4* 3.2* 3.1*   CBG: Recent Labs  Lab 08/30/23 0302  GLUCAP 92    Discharge time spent: greater than 31 minutes.  Signed: Nena Rebel, MD [facility] 09/01/2023

## 2023-09-01 NOTE — Hospital Course (Signed)
 53 year old male W/PMH of alcohol use disorder, history of alcoholic seizure, hepatic steatosis, hypertension who was brought into by ambulance after had multiple seizures at home noted by patient's wife.  Patient tried to attempt self detox at home by withdrawing alcohol but was unsuccessful.  The hospitalist he was treated with CIWA protocol.  His symptoms improved.  He felt better denied having any seizures in the hospital.  He did not have any tremors.  He was alert awake and oriented.  I had discussed with patient and patient's wife [name] over the phone and they agreed to take him home.  Discussed with case manager/social worker they have also given him resources for detox.  Patient's wife advised that they have done that in the past and they will not do at home at this point.  Patient will be discharged home in a stable condition on his home medications.  He was prescribed Keppra in the past and that has been continued at this point.

## 2023-09-01 NOTE — TOC Initial Note (Signed)
 Transition of Care [facility]) - Initial/Assessment Note    Patient Details  Name: Levi Mejia MRN: [id number] Date of Birth: [birth year]  Transition of Care [facility]) CM/SW Contact:    Lauraine JAYSON Carpen, LCSW Phone Number: 09/01/2023, 12:01 PM  Clinical Narrative:   CSW met with patient. No family at bedside. CSW introduced role and inquired about not having a PCP or insurance which he confirmed. Gave packet for free/low-cost healthcare in [facility] and intake paperwork for [employer]. Patient is agreeable to SA resources. Added to his AVS. No further concerns. CSW will continue to follow patient for support and facilitate return home once stable. Patient stated he will likely get an [place] home and confirmed he has the app on his phone.               Expected Discharge Plan: Home/Self Care Barriers to Discharge: Continued Medical Work up   Patient Goals and CMS Choice            Expected Discharge Plan and Services     Post Acute Care Choice: NA Living arrangements for the past 2 months: Single Family Home                                      Prior Living Arrangements/Services Living arrangements for the past 2 months: Single Family Home   Patient language and need for interpreter reviewed:: Yes Do you feel safe going back to the place where you live?: Yes      Need for Family Participation in Patient Care: Yes (Comment)     Criminal Activity/Legal Involvement Pertinent to Current Situation/Hospitalization: No - Comment as needed  Activities of Daily Living   ADL Screening (condition at time of admission) Independently performs ADLs?: Yes (appropriate for developmental age) Is the patient deaf or have difficulty hearing?: No Does the patient have difficulty seeing, even when wearing glasses/contacts?: No Does the patient have difficulty concentrating, remembering, or making decisions?: No  Permission Sought/Granted                  Emotional  Assessment Appearance:: Appears stated age Attitude/Demeanor/Rapport: Engaged, Gracious Affect (typically observed): Accepting, Appropriate, Calm, Pleasant Orientation: : Oriented to Self, Oriented to Place, Oriented to  Time, Oriented to Situation Alcohol / Substance Use: Not Applicable Psych Involvement: No (comment)  Admission diagnosis:  Delirium tremens (HCC) [F10.931] Alcohol withdrawal seizure with complication (HCC) [Q89.060, R56.9] Patient Active Problem List   Diagnosis Date Noted   Delirium tremens (HCC) 08/30/2023   Intractable back pain, acute on chronic 06/25/2023   Fall down 12 stairs 06/25/2023   Malnutrition of moderate degree 10/19/2022   Alcohol withdrawal with inpatient treatment (HCC) 10/17/2022   Scleral icterus 10/14/2022   Fatigue 10/14/2022   H/O alcohol dependence (HCC) 10/14/2022   Arthralgia of left hand 08/14/2022   Left hand pain 08/01/2022   Hypophosphatemia 07/30/2022   Weight loss, non-intentional 07/03/2022   Hyperlipidemia 07/03/2022   Screening PSA (prostate specific antigen) 07/03/2022   Colon cancer screening 07/03/2022   History of CVA (cerebrovascular accident) 07/03/2022   Nausea and vomiting 07/02/2022   Esophageal dysphagia 07/02/2022   Protein-calorie malnutrition, severe 07/01/2022   Alcohol withdrawal (HCC) 06/30/2022   Alcoholic hepatitis 06/29/2022   AKI (acute kidney injury) (HCC) 06/29/2022   Lactic acidosis 06/29/2022   Dehydration 06/29/2022   Stroke (HCC) 04/07/2022   Fall at home, initial  encounter 04/07/2022   Hypokalemia 04/07/2022   Hyponatremia 04/07/2022   Abnormal LFTs 04/07/2022   HTN (hypertension) 04/07/2022   Hypomagnesemia 04/07/2022   Seizure (HCC) 05/01/2020   Sensory loss 02/05/2019   Alcohol use disorder, severe, dependence (HCC) 03/02/2018   Cerebrovascular disease 03/02/2018   Syncope, vasovagal 02/02/2018   Preventative health care 09/30/2016   Sleep disorder 09/30/2016   Essential hypertension,  benign 08/23/2011   PCP:  Pcp, No Pharmacy:   [facility] - [place], [place] - [address] [place] [address] Phone: [phone] Fax: [phone]  [id number] [place], [place] - [address] [place] [address] Phone: [phone] Fax: [phone]  [facility] - [place], [place] - [address] [place] [address] Phone: [phone] Fax: [phone]     Social Drivers of Health (SDOH) Social History: SDOH Screenings   Food Insecurity: No Food Insecurity (08/30/2023)  Housing: Low Risk  (08/30/2023)  Transportation Needs: No Transportation Needs (08/30/2023)  Utilities: Not At Risk (08/30/2023)  Alcohol Screen: Medium Risk (08/08/2022)  Depression (PHQ2-9): Low Risk  (10/14/2022)  Financial Resource Strain: Low Risk  (03/22/2023)   [facility]  Physical Activity: Sufficiently Active (08/08/2022)  Recent Concern: Physical Activity - Inactive (07/14/2022)  Social Connections: Unknown (08/08/2022)  Recent Concern: Social Connections - Moderately Isolated (08/08/2022)  Stress: No Stress Concern Present (08/08/2022)  Tobacco Use: Low Risk  (08/30/2023)   SDOH Interventions:     Readmission Risk Interventions    10/18/2022    3:02 PM  Readmission Risk Prevention Plan  Transportation Screening Complete  PCP or Specialist Appt within 3-5 Days Complete  HRI or Home Care Consult Complete  Social Work Consult for Recovery Care Planning/Counseling Complete  Palliative Care Screening Not Complete  Medication Review Oceanographer) Complete

## 2023-09-01 NOTE — TOC Transition Note (Signed)
 Transition of Care [facility]) - Discharge Note   Patient Details  Name: Levi Mejia MRN: [id number] Date of Birth: Jun 16, 1970  Transition of Care [facility]) CM/SW Contact:  Lauraine JAYSON Carpen, LCSW Phone Number: 09/01/2023, 1:55 PM   Clinical Narrative:   Patient has orders to discharge home today. No further concerns. CSW signing off.  Final next level of care: Home/Self Care Barriers to Discharge: Barriers Resolved   Patient Goals and CMS Choice            Discharge Placement                Patient to be transferred to facility by: Gisele   Patient and family notified of of transfer: 09/01/23  Discharge Plan and Services Additional resources added to the After Visit Summary for       Post Acute Care Choice: NA                               Social Drivers of Health (SDOH) Interventions SDOH Screenings   Food Insecurity: No Food Insecurity (08/30/2023)  Housing: Low Risk  (08/30/2023)  Transportation Needs: No Transportation Needs (08/30/2023)  Utilities: Not At Risk (08/30/2023)  Alcohol Screen: Medium Risk (08/08/2022)  Depression (PHQ2-9): Low Risk  (10/14/2022)  Financial Resource Strain: Low Risk  (03/22/2023)   [facility]  Physical Activity: Sufficiently Active (08/08/2022)  Recent Concern: Physical Activity - Inactive (07/14/2022)  Social Connections: Unknown (08/08/2022)  Recent Concern: Social Connections - Moderately Isolated (08/08/2022)  Stress: No Stress Concern Present (08/08/2022)  Tobacco Use: Low Risk  (08/30/2023)     Readmission Risk Interventions    10/18/2022    3:02 PM  Readmission Risk Prevention Plan  Transportation Screening Complete  PCP or Specialist Appt within 3-5 Days Complete  HRI or Home Care Consult Complete  Social Work Consult for Recovery Care Planning/Counseling Complete  Palliative Care Screening Not Complete  Medication Review Oceanographer) Complete

## 2023-09-01 NOTE — Discharge Instructions (Signed)
Intensive Outpatient Programs   [facility] [address] [place],  [place], [place] [phone]  [facility] (Inpatient and [phone] (Suboxone and Methadone) [address] [phone]  ADS: Alcohol & Drug [facility] Programs - Intensive Outpatient [address] [place], [place] [address], [place]  [phone]  [facility] Margo Aye (Outpatient, Inpatient, Chemical [facility] (Groups and Residental) (insurance only) [phone] [place], [place] [phone]   [facility] (for caregivers and family) [address] Laurell Josephs [address] [place], [place] [phone]  Residential Treatment Programs  [facility](2 years) Residential: 28 days)[facility] ([facility] [address] [place], [place], [place] [phone] or [phone]  [facility] Treatment [facility] [address] [place], [place], [place] [phone]  [facility] Residential Treatment FacilityResidential Treatment Services (RTS) [address] [place], [place], [place] [phone] Admissions: 8am-3pm M-F  [facility] Program: Residential Program [phone] Days)             [facility]: [facility]  [place], [place], [place]  [phone] or [phone] in Hours over the weekend or by referral)  [facility] [address] [place], [place] [address] [phone] (Do virtual or phone assessment, offer transportation within 25 miles, have in patient and Outpatient options)   Mobil Crisis: [facility]:[phone] (for crisis  response 24 hours a day)      Addiction and Co-Occurring Disorders   [facility], Intensive Outpatient Program is held three afternoons from 1 to 4pm; Monday, Wednesday and Friday allowing patients the opportunity to participate in a treatment program in the day while attending A meetings in the evening. W e provide educational lectures and films, group therapy, individual counseling and family counseling.  A multidisciplinary team of the [facility] healthcare professionals, headed by the Medical Director, designs an individualized treatment program to suit the needs of each patient. The length of stay in this program is determined by the goals and objectives set by the treatment team, but usually is 6 to 10 weeks. Direct admission to IOP is possible after an individual evaluation by our assessment team. Chemical usage patterns, the progressionof disease in the individual, the possible need for detoxification and the availability of an external support system are factors in determining who is appropriate for. direct OP admission. Each person has the opportunity to embrace a lifestyle of on-going recovery, family involvement and attending a 12 Step Support group. Program Objectives Include  Chemicaldependency education Abstinence for all mind-altering chemicals 12 Step support Relapse Prevention Planning Improved communication skills Spirituality and personal fulfilment Mondav-Wednesday-Friday 1:00pm to 4:00pm Individual and Family Sessions are set by the Counselor ANN EVANS, LCAS, NCC, MA Licensed Clinical Addictions Spcialist [phone]    [facility] Chemical Depencency Intensive OutpatientPrograms Alcohol & Drug Services (ADS) [address] [place] [address] [place]: [phone] [place]: [phone] : [phone] [place]: [phone] Both daytime & evening counseling available Accepts Medicaid and other insurances  [facility] [phone] Meets four times a week for 2 1?2 -3 hours each session Both daytime & evening counseling available Accepts Medicaid and other insurances  [facility] Health CD-IOP - Outpatient Services [address] [place] [address] [phone] Meets 3 days a week for 4 hours Accepts Medicare and  other insurances (not Medicaid)  [employer] Substance Abuse Treatment Center [address] [place] [address] [phone]  These referrals have been provided to you as appropriate for your clinical needs while taking into account your financial concerns. Please be aware that agencies, practitioners and insurance companies sometimes change contracts. When calling to make an appointment have your insurance information available so the professional you aregoing to see can confirm whether they are covered by your plan. Take this form with you in case the person you are seeing needs a copy or to contact us. Assessment Counselor   [facility] Outpatient Programs [facility] Health Outpatient Chemical Dependence Intensive Outpatient Program [address] [place], [place] [address] [phone] Private insurance, Medicare A&B, and [facility] ADS (Alcohol and Drug Services) [address], [place], [place] [address] [phone] Medicaid, Self Pay  [facility] [address] Jamey Ripa [phone] Private Insurance, Self Pay  [facility] and Partial Hospitalization Program [address] [place], [place] [address] [phone] Private Insurance, Medicaid-[place]point and Loann Quill only for partial [facility] [address] [place], [place] [phone] Private Insurance and Self Pay  [facility] 3 7 1  4[facility] [address] [place], [place] [phone] Private Insurance, and Self Pay.  Theatre stage manager (Partial Hospitalization) [address] [place] [address] [phone] [facility] Outpatient [address] [employer] [address] [employer], [place], and Self Pay  [facility]: Methadone Clinic [address] [place], [place], [place] [address] [phone] [address] [address] [place], [place] [address] Phone: [phone] or [phone] [facility] Services [address] [place], [place] [address] [phone] Medicaid, private insurance [facility] [address] [place], [place] [address] [phone] or [phone] Private Insurance Only  [facility] [address] [phone] Self Pay only, sliding scale  [facility] [address] [place], [place] [address] [phone] State funds for uninsured [facility]. [place], [place] [phone] Private Insurance, and Self pay [employer], some Medicaid Crisis Mobile: [facility]: [phone] (for crisis response 24 hours a day) [facility] [phone]   [facility] Adolescents [facility] Ng [phone] Private Insurance, and [facility]  [facility] [address] [place], [place] [phone] Private Insurance, and [place]. [facility]: [phone] ASAP: Adolescent Substance Abuse Program Males ages: 12-17 ASAP: Adolescent Substance abuse Program Females: 12-17 [facility] [employer] Structured Day Program [phone] CrisisMobile: [facility]: [phone] (for crisis response 24 hours a day) [facility] [phone]

## 2023-09-01 NOTE — Progress Notes (Signed)
 Patient left 2A alert by wheelchair with Bryant, NT. Patient discharging home with Gisele driving him.

## 2023-09-12 ENCOUNTER — Emergency Department
Admission: EM | Admit: 2023-09-12 | Discharge: 2023-09-12 | Disposition: A | Discharge status: Home / Self Care | Payer: Self-pay | Attending: Emergency Medicine | Admitting: Emergency Medicine

## 2023-09-12 ENCOUNTER — Other Ambulatory Visit: Payer: Self-pay

## 2023-09-12 DIAGNOSIS — I10 Essential (primary) hypertension: Secondary | ICD-10-CM | POA: Insufficient documentation

## 2023-09-12 DIAGNOSIS — F10120 Alcohol abuse with intoxication, uncomplicated: Secondary | ICD-10-CM | POA: Insufficient documentation

## 2023-09-12 DIAGNOSIS — F10920 Alcohol use, unspecified with intoxication, uncomplicated: Secondary | ICD-10-CM

## 2023-09-12 DIAGNOSIS — Y908 Blood alcohol level of 240 mg/100 ml or more: Secondary | ICD-10-CM | POA: Insufficient documentation

## 2023-09-12 LAB — CBC
HCT: 41.8 % (ref 39.0–52.0)
Hemoglobin: 14.4 g/dL (ref 13.0–17.0)
MCH: 33.8 pg (ref 26.0–34.0)
MCHC: 34.4 g/dL (ref 30.0–36.0)
MCV: 98.1 fL (ref 80.0–100.0)
Platelets: 144 K/uL — ABNORMAL LOW (ref 150–400)
RBC: 4.26 MIL/uL (ref 4.22–5.81)
RDW: 14.3 % (ref 11.5–15.5)
WBC: 6.2 K/uL (ref 4.0–10.5)
nRBC: 0 % (ref 0.0–0.2)

## 2023-09-12 LAB — BASIC METABOLIC PANEL WITH GFR
Anion gap: 15 (ref 5–15)
BUN: 5 mg/dL — ABNORMAL LOW (ref 6–20)
CO2: 24 mmol/L (ref 22–32)
Calcium: 9 mg/dL (ref 8.9–10.3)
Chloride: 104 mmol/L (ref 98–111)
Creatinine, Ser: 0.73 mg/dL (ref 0.61–1.24)
GFR, Estimated: >60 mL/min (ref >60)
Glucose, Bld: 104 mg/dL — ABNORMAL HIGH (ref 70–99)
Potassium: 3.7 mmol/L (ref 3.5–5.1)
Sodium: 143 mmol/L (ref 135–145)

## 2023-09-12 LAB — ETHANOL: Alcohol, Ethyl (B): 379 mg/dL (ref <15)

## 2023-09-12 MED ORDER — CHLORDIAZEPOXIDE HCL 25 MG PO CAPS: ORAL_CAPSULE | ORAL | 0 refills | Status: AC

## 2023-09-12 MED ORDER — PHENOBARBITAL SODIUM 65 MG/ML IJ SOLN
130.0000 mg | Freq: Once | INTRAMUSCULAR | Status: AC
  Administered 2023-09-12: 130 mg via INTRAVENOUS
  Filled 2023-09-12: qty 2

## 2023-09-12 MED ORDER — LORAZEPAM 1 MG PO TABS: 1.0000 mg | ORAL_TABLET | ORAL | Status: DC | PRN

## 2023-09-12 MED ORDER — THIAMINE HCL 100 MG/ML IJ SOLN: 100.0000 mg | Freq: Every day | INTRAMUSCULAR | Status: DC

## 2023-09-12 MED ORDER — LORAZEPAM 2 MG/ML IJ SOLN: 1.0000 mg | INTRAMUSCULAR | Status: DC | PRN

## 2023-09-12 MED ORDER — THIAMINE MONONITRATE 100 MG PO TABS
100.0000 mg | ORAL_TABLET | Freq: Every day | ORAL | Status: DC
  Administered 2023-09-12: 100 mg via ORAL
  Filled 2023-09-12: qty 1

## 2023-09-12 MED ORDER — FOLIC ACID 1 MG PO TABS
1.0000 mg | ORAL_TABLET | Freq: Every day | ORAL | Status: DC
  Administered 2023-09-12: 1 mg via ORAL
  Filled 2023-09-12: qty 1

## 2023-09-12 MED ORDER — ADULT MULTIVITAMIN W/MINERALS CH
1.0000 | ORAL_TABLET | Freq: Every day | ORAL | Status: DC
  Administered 2023-09-12: 1 via ORAL
  Filled 2023-09-12: qty 1

## 2023-09-12 NOTE — ED Provider Notes (Signed)
 [facility] Provider Note   Event Date/Time   First MD Initiated Contact with Patient 09/12/23 1516     (approximate) History  Seizures  HPI Levi Mejia is a 53 y.o. male with a past medical history of hypertension, hyperlipidemia, alcohol abuse, and alcohol withdrawal seizures in the past who presents complaining of symptoms of alcohol withdrawal symptoms similar to a previous admission on July 2 of this year.  Patient states that since he has been discharged from the hospital he has been trying to drink less however recently has increased with the last drink being the night prior to arrival.  Patient states that he has episodes where his upper and lower extremities will lock up and he is unable to speak.  Patient states that he remembers these episodes however they are involuntary. ROS: Patient currently denies any vision changes, tinnitus, difficulty speaking, facial droop, sore throat, chest pain, shortness of breath, abdominal pain, nausea/vomiting/diarrhea, dysuria, or weakness/numbness/paresthesias in any extremity   Physical Exam  Triage Vital Signs: ED Triage Vitals  Encounter Vitals Group     BP 09/12/23 1456 (!) 143/92     Girls Systolic BP Percentile --      Girls Diastolic BP Percentile --      Boys Systolic BP Percentile --      Boys Diastolic BP Percentile --      Pulse Rate 09/12/23 1456 97     Resp 09/12/23 1456 18     Temp 09/12/23 1456 98 F (36.7 C)     Temp src --      SpO2 09/12/23 1456 99 %     Weight --      Height --      Head Circumference --      Peak Flow --      Pain Score 09/12/23 1453 0     Pain Loc --      Pain Education --      Exclude from Growth Chart --    Most recent vital signs: Vitals:   09/12/23 1830 09/12/23 1900  BP: 131/86 114/74  Pulse: 96 91  Resp: 14 11  Temp:    SpO2: 98% 97%   General: Awake, oriented x4. CV:  Good peripheral perfusion. Resp:  Normal effort. Abd:  No  distention. Other:  Middle-aged overweight Caucasian male resting comfortably in no acute distress.  Occasional muscle spasms in bilateral upper extremities and grunting with intact mental status ED Results / Procedures / Treatments  Labs (all labs ordered are listed, but only abnormal results are displayed) Labs Reviewed  CBC - Abnormal; Notable for the following components:      Result Value   Platelets 144 (*)    All other components within normal limits  BASIC METABOLIC PANEL WITH GFR - Abnormal; Notable for the following components:   Glucose, Bld 104 (*)    BUN 5 (*)    All other components within normal limits  ETHANOL - Abnormal; Notable for the following components:   Alcohol, Ethyl (B) 379 (*)    All other components within normal limits  CBG MONITORING, ED   EKG ED ECG REPORT I, Artist MARLA Kerns, the attending physician, personally viewed and interpreted this ECG. Date: 09/12/2023 EKG Time: 1453 Rate: 96 Rhythm: normal sinus rhythm QRS Axis: normal Intervals: normal ST/T Wave abnormalities: normal Narrative Interpretation: no evidence of acute ischemia PROCEDURES: Critical Care performed: No Procedures MEDICATIONS ORDERED IN ED: Medications  LORazepam  (ATIVAN ) tablet 1-4  mg (has no administration in time range)    Or  LORazepam  (ATIVAN ) injection 1-4 mg (has no administration in time range)  thiamine  (VITAMIN B1) tablet 100 mg (100 mg Oral Given 09/12/23 1548)    Or  thiamine  (VITAMIN B1) injection 100 mg ( Intravenous See Alternative 09/12/23 1548)  folic acid  (FOLVITE ) tablet 1 mg (1 mg Oral Given 09/12/23 1548)  multivitamin with minerals tablet 1 tablet (1 tablet Oral Given 09/12/23 1548)  PHENObarbital  (LUMINAL) injection 130 mg (130 mg Intravenous Given 09/12/23 1547)   IMPRESSION / MDM / ASSESSMENT AND PLAN / ED COURSE  I reviewed the triage vital signs and the nursing notes.                             The patient is on the cardiac monitor to evaluate for  evidence of arrhythmia and/or significant heart rate changes. Patient's presentation is most consistent with acute presentation with potential threat to life or bodily function. Presents with altered mental status. +Slurred, sluggish behavior. Stated EtOH intoxication. Airway maintained. Unlikely intracranial bleed, opioid intoxication or coingestion, sepsis, hypothyroidism. Suspect likely transient course of intoxication with expected  improvement of symptoms as patient metabolizes offending agent.  Plan: frequent reassessments  Reassessment Note: Time: 2 hours since initial presentation. Evaluation: Frequent mental status exams showed improving symptoms and evidence that the patient's AMS was secondary to intoxication. Pt able to ambulate without difficulty and PO tolerant. Plan DC home with ride and return precautions. Disposition: Discharge home    FINAL CLINICAL IMPRESSION(S) / ED DIAGNOSES   Final diagnoses:  Alcoholic intoxication without complication (HCC)   Rx / DC Orders   ED Discharge Orders          Ordered    chlordiazePOXIDE  (LIBRIUM ) 25 MG capsule  Multiple Frequencies        09/12/23 1843           Note:  This document was prepared using Dragon voice recognition software and may include unintentional dictation errors.   Jossie Artist POUR, MD 09/12/23 [phone]

## 2023-09-12 NOTE — Discharge Instructions (Signed)
 Support for Substance Use  Crisis Help Are you or is someone you know experiencing a crisis? The good news is that there are several ways to get help.  Anyone living in [facility]'s region can call our toll-free, 24/7 Behavioral Health Crisis Line at [phone]. Your call is confidential. You will speak with a trained professional who can provide immediate crisis intervention by telephone and arrange a face-to-face assessment, based on need, for [employer] members.  The 75 Lifeline is also available 24 hours a day, seven days a week. Call or text 988 or use the chat option on 988lifeline.org to speak with a trained crisis counselor any time, day or night.  Individuals who are hearing-impaired can dial 711 to reach [facility] Relay.  Please note: If you have a medical or life-threatening emergency, call 911 or go to a hospital emergency department immediately.  Facility-based Crisis [facility]) centers provide 24/7 non-hospital medical services to individuals who are experiencing a mental health or substance use crisis. The centers provide community-based treatment alternatives for people seeking care voluntarily, and most accept individuals who have been involuntarily committed to treatment. To inquire about admission to an [facility], we encourage you to call the facility first to assess availability and appropriateness.   [employer] supports FBC centers at the following facilities:  Adult: [facility] [employer]) [facility], [address] [place], [place] [address] Adult: [facility] of [address], [address] [place], [place] [address] Adult: [facility], [address], [place], [place] [address] Adult: [facility], [address], [place], [place] [address] Adult: [facility], [address] [place], [place] [address] Child/Adolescent: Hassel Cure  [facility], [address]  [place], [place] [address]  Weblinks [email or web address].[email or web address]/[email or web address]?option=com_cpx  parricidea.com NAMI [place]-NAMI HelpLine: M-F 10 am - 10 pm ET  Call: 1-800-950-NAMI (3735)  Text: 37359  Chat: BoardConference.com.cy  Email: helpline@nami .org  AA meetings [email or web address]

## 2023-09-12 NOTE — ED Triage Notes (Signed)
 Pt comes with c/o seizures from alcohol withdrawal. Pt states he last drank yesterday. Pt drinks 5th liquor daily for last 5-40yrs. Pt was just here and admitted for same on July 2.   Wife reports pt had seizure in car when she got here. This RN and [employer] observed pt locking up and holding his fist as if cramping and body becomes stiff. No shaking noted. Pt then stops and is completely alert and responsive. Pt grunts.  Pt states headache, nausea and vomiting. Pt denies any belly pain.

## 2023-09-12 NOTE — TOC Progression Note (Signed)
 Transition of Care [facility]) - Progression Note    Patient Details  Name: Levi Mejia MRN: [id number] Date of Birth: [birth year]  Transition of Care [facility]) CM/SW Contact  Edsel DELENA Fischer, LCSW Phone Number: 09/12/2023, 6:40 PM  Clinical Narrative:     SW provided pt with AA resources and list of local pcp to establish care. Pt stated that he has resources and wife is setting up care.  Pt stated that he is waiting to be discharged. Pt stated that wife has located detox facility in [facility] but unsure of name. SW called wife. Calls go straight to voice. Sw left message to call sw back and sw left direct sw contact information.     Barriers to Discharge: Continued Medical Work up  Expected Discharge Plan and Services In-house Referral: Clinical Social Work     Living arrangements for the past 2 months: Single Family Home                                       Social Determinants of Health (SDOH) Interventions SDOH Screenings   Food Insecurity: No Food Insecurity (08/30/2023)  Housing: Low Risk  (08/30/2023)  Transportation Needs: No Transportation Needs (08/30/2023)  Utilities: Not At Risk (08/30/2023)  Alcohol Screen: Medium Risk (08/08/2022)  Depression (PHQ2-9): Low Risk  (10/14/2022)  Financial Resource Strain: Low Risk  (03/22/2023)   Received from [facility]  Physical Activity: Sufficiently Active (08/08/2022)  Recent Concern: Physical Activity - Inactive (07/14/2022)  Social Connections: Unknown (08/08/2022)  Recent Concern: Social Connections - Moderately Isolated (08/08/2022)  Stress: No Stress Concern Present (08/08/2022)  Tobacco Use: Low Risk  (09/12/2023)    Readmission Risk Interventions    10/18/2022    3:02 PM  Readmission Risk Prevention Plan  Transportation Screening Complete  PCP or Specialist Appt within 3-5 Days Complete  HRI or Home Care Consult Complete  Social Work Consult for Recovery Care Planning/Counseling Complete  Palliative  Care Screening Not Complete  Medication Review Oceanographer) Complete

## 2023-09-12 NOTE — ED Notes (Addendum)
 Pt wanting to go home because he has stuff that needs to be done at home, informed pt that his RN will come in and speak with him.

## 2023-09-12 NOTE — TOC Initial Note (Signed)
 Transition of Care [facility]) - Initial/Assessment Note    Patient Details  Name: Levi Mejia MRN: [id number] Date of Birth: 09-May-1970  Transition of Care [facility]) CM/SW Contact:    Levi DELENA Fischer, LCSW Phone Number: 09/12/2023, 5:58 PM  Clinical Narrative:                  SW met with pt at bedside in ED.  Pt gave sw permission to talk with wife if needed. Per pt report: Pt stated that seizures caused by alcohol withdrawal is reason he is in the ED and wife bought him. SW asked pt was he trying to detox. Pt stated yes.  SW asked pt if pt wants to go to detox facility.  Pt stated that his wife is working on that.  Pt is not a veteran but wife is Garment/textile technologist and honorably discharged).  Pt stated that he lives with his wife-Levi Mejia.  Safety/ DV concerns: no.  Pt does not have PCP.  Pt stated that wife will help with locating pt detox facilities. Pt stated that he does not take medication and does not have pharmacy. SW asked if you did take medications where would you go? Pt stated he didn't know.  HH: no. DME: no.  Pt stated that he is not driving at the moment but wife will provide transportation. Pt stated that he is not driving due to seizures.  No financial concerns reported.  ADL/ IADL concerns: no. Incontinence issues: no, CPAP/ Oxygen: no.  Pt stated that he has a history of substance use: alcohol long time, started 2001   SW attached resources to included, PCP options and substance abuse supports   Barriers to Discharge: Continued Medical Work up   Patient Goals and CMS Choice Patient states their goals for this hospitalization and ongoing recovery are:: possible detox center   Choice offered to / list presented to : Patient      Expected Discharge Plan and Services In-house Referral: Clinical Social Work     Living arrangements for the past 2 months: Single Family Home                                      Prior Living Arrangements/Services Living arrangements  for the past 2 months: Single Family Home Lives with:: Spouse Patient language and need for interpreter reviewed:: Yes Do you feel safe going back to the place where you live?: Yes               Activities of Daily Living      Permission Sought/Granted                  Emotional Assessment Appearance:: Appears stated age Attitude/Demeanor/Rapport: Engaged Affect (typically observed): Appropriate Orientation: : Oriented to Situation, Oriented to  Time, Oriented to Place, Oriented to Self Alcohol / Substance Use: Alcohol Use Psych Involvement: No (comment)  Admission diagnosis:  detox Patient Active Problem List   Diagnosis Date Noted   Delirium tremens (HCC) 08/30/2023   Intractable back pain, acute on chronic 06/25/2023   Fall down 12 stairs 06/25/2023   Malnutrition of moderate degree 10/19/2022   Alcohol withdrawal with inpatient treatment (HCC) 10/17/2022   Scleral icterus 10/14/2022   Fatigue 10/14/2022   H/O alcohol dependence (HCC) 10/14/2022   Arthralgia of left hand 08/14/2022   Left hand pain 08/01/2022   Hypophosphatemia 07/30/2022   Weight loss, non-intentional  07/03/2022   Hyperlipidemia 07/03/2022   Screening PSA (prostate specific antigen) 07/03/2022   Colon cancer screening 07/03/2022   History of CVA (cerebrovascular accident) 07/03/2022   Nausea and vomiting 07/02/2022   Esophageal dysphagia 07/02/2022   Protein-calorie malnutrition, severe 07/01/2022   Alcohol withdrawal (HCC) 06/30/2022   Alcoholic hepatitis 06/29/2022   AKI (acute kidney injury) (HCC) 06/29/2022   Lactic acidosis 06/29/2022   Dehydration 06/29/2022   Stroke (HCC) 04/07/2022   Fall at home, initial encounter 04/07/2022   Hypokalemia 04/07/2022   Hyponatremia 04/07/2022   Abnormal LFTs 04/07/2022   HTN (hypertension) 04/07/2022   Hypomagnesemia 04/07/2022   Seizure (HCC) 05/01/2020   Sensory loss 02/05/2019   Alcohol use disorder, severe, dependence (HCC) 03/02/2018    Cerebrovascular disease 03/02/2018   Syncope, vasovagal 02/02/2018   Preventative health care 09/30/2016   Sleep disorder 09/30/2016   Essential hypertension, benign 08/23/2011   PCP:  Pcp, No Pharmacy:   [facility] - [place], [place] - [address] [place] [address] Phone: [phone] Fax: [phone]  [id number] - [place], [place] - [address] [place] [address] Phone: [phone] Fax: [phone]  [facility] - [place], [place] - [address] [place] [address] Phone: [phone] Fax: [phone]     Social Drivers of Health (SDOH) Social History: SDOH Screenings   Food Insecurity: No Food Insecurity (08/30/2023)  Housing: Low Risk  (08/30/2023)  Transportation Needs: No Transportation Needs (08/30/2023)  Utilities: Not At Risk (08/30/2023)  Alcohol Screen: Medium Risk (08/08/2022)  Depression (PHQ2-9): Low Risk  (10/14/2022)  Financial Resource Strain: Low Risk  (03/22/2023)   Received from [facility]  Physical Activity: Sufficiently Active (08/08/2022)  Recent Concern: Physical Activity - Inactive (07/14/2022)  Social Connections: Unknown (08/08/2022)  Recent Concern: Social Connections - Moderately Isolated (08/08/2022)  Stress: No Stress Concern Present (08/08/2022)  Tobacco Use: Low Risk  (09/12/2023)   SDOH Interventions:     Readmission Risk Interventions    10/18/2022    3:02 PM  Readmission Risk Prevention Plan  Transportation Screening Complete  PCP or Specialist Appt within 3-5 Days Complete  HRI or Home Care Consult Complete  Social Work Consult for Recovery Care Planning/Counseling Complete  Palliative Care Screening Not Complete  Medication Review Oceanographer) Complete

## 2023-09-18 ENCOUNTER — Observation Stay
Admission: EM | Admit: 2023-09-18 | Discharge: 2023-09-19 | Disposition: A | Discharge status: Home / Self Care | Payer: Self-pay | Attending: Internal Medicine | Admitting: Internal Medicine

## 2023-09-18 ENCOUNTER — Other Ambulatory Visit: Payer: Self-pay

## 2023-09-18 DIAGNOSIS — F1020 Alcohol dependence, uncomplicated: Principal | ICD-10-CM | POA: Diagnosis present

## 2023-09-18 DIAGNOSIS — Z7901 Long term (current) use of anticoagulants: Secondary | ICD-10-CM | POA: Insufficient documentation

## 2023-09-18 DIAGNOSIS — I10 Essential (primary) hypertension: Secondary | ICD-10-CM | POA: Diagnosis present

## 2023-09-18 DIAGNOSIS — Z8659 Personal history of other mental and behavioral disorders: Secondary | ICD-10-CM | POA: Insufficient documentation

## 2023-09-18 DIAGNOSIS — F10939 Alcohol use, unspecified with withdrawal, unspecified: Principal | ICD-10-CM

## 2023-09-18 DIAGNOSIS — F1021 Alcohol dependence, in remission: Secondary | ICD-10-CM

## 2023-09-18 DIAGNOSIS — Z8673 Personal history of transient ischemic attack (TIA), and cerebral infarction without residual deficits: Secondary | ICD-10-CM | POA: Insufficient documentation

## 2023-09-18 DIAGNOSIS — Z79899 Other long term (current) drug therapy: Secondary | ICD-10-CM | POA: Insufficient documentation

## 2023-09-18 DIAGNOSIS — K760 Fatty (change of) liver, not elsewhere classified: Secondary | ICD-10-CM | POA: Insufficient documentation

## 2023-09-18 DIAGNOSIS — D696 Thrombocytopenia, unspecified: Secondary | ICD-10-CM | POA: Diagnosis present

## 2023-09-18 DIAGNOSIS — R569 Unspecified convulsions: Secondary | ICD-10-CM

## 2023-09-18 LAB — CBC WITH DIFFERENTIAL/PLATELET
Abs Immature Granulocytes: 0.01 K/uL (ref 0.00–0.07)
Basophils Absolute: 0.1 K/uL (ref 0.0–0.1)
Basophils Relative: 2 %
Eosinophils Absolute: 0.3 K/uL (ref 0.0–0.5)
Eosinophils Relative: 7 %
HCT: 40.9 % (ref 39.0–52.0)
Hemoglobin: 14.4 g/dL (ref 13.0–17.0)
Immature Granulocytes: 0 %
Lymphocytes Relative: 32 %
Lymphs Abs: 1.4 K/uL (ref 0.7–4.0)
MCH: 34.1 pg — ABNORMAL HIGH (ref 26.0–34.0)
MCHC: 35.2 g/dL (ref 30.0–36.0)
MCV: 96.9 fL (ref 80.0–100.0)
Monocytes Absolute: 0.3 K/uL (ref 0.1–1.0)
Monocytes Relative: 7 %
Neutro Abs: 2.3 K/uL (ref 1.7–7.7)
Neutrophils Relative %: 52 %
Platelets: 97 K/uL — ABNORMAL LOW (ref 150–400)
RBC: 4.22 MIL/uL (ref 4.22–5.81)
RDW: 14.3 % (ref 11.5–15.5)
WBC: 4.4 K/uL (ref 4.0–10.5)
nRBC: 0 % (ref 0.0–0.2)

## 2023-09-18 LAB — LACTIC ACID, PLASMA
Lactic Acid, Venous: 3.4 mmol/L (ref 0.5–1.9)
Lactic Acid, Venous: 3.6 mmol/L (ref 0.5–1.9)

## 2023-09-18 LAB — COMPREHENSIVE METABOLIC PANEL WITH GFR
ALT: 43 U/L (ref 0–44)
AST: 130 U/L — ABNORMAL HIGH (ref 15–41)
Albumin: 3.7 g/dL (ref 3.5–5.0)
Alkaline Phosphatase: 90 U/L (ref 38–126)
Anion gap: 15 (ref 5–15)
BUN: 6 mg/dL (ref 6–20)
CO2: 27 mmol/L (ref 22–32)
Calcium: 8.6 mg/dL — ABNORMAL LOW (ref 8.9–10.3)
Chloride: 104 mmol/L (ref 98–111)
Creatinine, Ser: 0.78 mg/dL (ref 0.61–1.24)
GFR, Estimated: >60 mL/min (ref >60)
Glucose, Bld: 86 mg/dL (ref 70–99)
Potassium: 3.5 mmol/L (ref 3.5–5.1)
Sodium: 146 mmol/L — ABNORMAL HIGH (ref 135–145)
Total Bilirubin: 1 mg/dL (ref 0.0–1.2)
Total Protein: 7.1 g/dL (ref 6.5–8.1)

## 2023-09-18 LAB — MAGNESIUM: Magnesium: 1.6 mg/dL — ABNORMAL LOW (ref 1.7–2.4)

## 2023-09-18 LAB — VITAMIN B12: Vitamin B-12: 411 pg/mL (ref 180–914)

## 2023-09-18 LAB — TROPONIN I (HIGH SENSITIVITY)
Troponin I (High Sensitivity): 6 ng/L (ref <18)
Troponin I (High Sensitivity): 6 ng/L (ref <18)

## 2023-09-18 LAB — MRSA NEXT GEN BY PCR, NASAL: MRSA by PCR Next Gen: NOT DETECTED

## 2023-09-18 LAB — ETHANOL: Alcohol, Ethyl (B): 204 mg/dL — ABNORMAL HIGH (ref <15)

## 2023-09-18 LAB — PROTIME-INR
INR: 1.1 (ref 0.8–1.2)
Prothrombin Time: 15 s (ref 11.4–15.2)

## 2023-09-18 MED ORDER — THIAMINE HCL 100 MG/ML IJ SOLN: 250.0000 mg | Freq: Every day | INTRAVENOUS | Status: DC

## 2023-09-18 MED ORDER — ADULT MULTIVITAMIN W/MINERALS CH
1.0000 | ORAL_TABLET | Freq: Every day | ORAL | Status: DC
  Administered 2023-09-19: 1 via ORAL
  Filled 2023-09-18: qty 1

## 2023-09-18 MED ORDER — ASPIRIN 81 MG PO TBEC
81.0000 mg | DELAYED_RELEASE_TABLET | Freq: Every day | ORAL | Status: DC
  Administered 2023-09-19: 81 mg via ORAL
  Filled 2023-09-18: qty 1

## 2023-09-18 MED ORDER — PHENOBARBITAL SODIUM 130 MG/ML IJ SOLN
130.0000 mg | Freq: Once | INTRAMUSCULAR | Status: AC
  Administered 2023-09-18: 130 mg via INTRAVENOUS
  Filled 2023-09-18: qty 1

## 2023-09-18 MED ORDER — POLYETHYLENE GLYCOL 3350 17 G PO PACK
17.0000 g | PACK | Freq: Every day | ORAL | Status: DC | PRN
Start: 2023-09-18 — End: 2023-09-20

## 2023-09-18 MED ORDER — PHENOBARBITAL SODIUM 130 MG/ML IJ SOLN: 65.0000 mg | Freq: Three times a day (TID) | INTRAMUSCULAR | Status: DC

## 2023-09-18 MED ORDER — LACTATED RINGERS IV SOLN: INTRAVENOUS | Status: DC

## 2023-09-18 MED ORDER — BISACODYL 5 MG PO TBEC
5.0000 mg | DELAYED_RELEASE_TABLET | Freq: Every day | ORAL | Status: DC | PRN
Start: 2023-09-18 — End: 2023-09-20

## 2023-09-18 MED ORDER — LORAZEPAM 1 MG PO TABS: 1.0000 mg | ORAL_TABLET | ORAL | Status: DC | PRN

## 2023-09-18 MED ORDER — LISINOPRIL 5 MG PO TABS
5.0000 mg | ORAL_TABLET | Freq: Every day | ORAL | Status: DC
  Administered 2023-09-19: 5 mg via ORAL
  Filled 2023-09-18: qty 1

## 2023-09-18 MED ORDER — LORAZEPAM 2 MG/ML IJ SOLN
INTRAMUSCULAR | Status: AC
Start: 2023-09-18 — End: 2023-09-18
  Administered 2023-09-18: 4 mg via INTRAVENOUS
  Filled 2023-09-18: qty 1

## 2023-09-18 MED ORDER — HYDRALAZINE HCL 20 MG/ML IJ SOLN: 5.0000 mg | INTRAMUSCULAR | Status: DC | PRN

## 2023-09-18 MED ORDER — ACETAMINOPHEN 325 MG PO TABS: 650.0000 mg | ORAL_TABLET | Freq: Four times a day (QID) | ORAL | Status: DC | PRN

## 2023-09-18 MED ORDER — MAGNESIUM SULFATE 2 GM/50ML IV SOLN
2.0000 g | Freq: Once | INTRAVENOUS | Status: AC
  Administered 2023-09-18: 2 g via INTRAVENOUS
  Filled 2023-09-18: qty 50

## 2023-09-18 MED ORDER — DOCUSATE SODIUM 100 MG PO CAPS
100.0000 mg | ORAL_CAPSULE | Freq: Two times a day (BID) | ORAL | Status: DC
  Administered 2023-09-19: 100 mg via ORAL
  Filled 2023-09-18: qty 1

## 2023-09-18 MED ORDER — SODIUM CHLORIDE 0.9% FLUSH
3.0000 mL | Freq: Two times a day (BID) | INTRAVENOUS | Status: DC
  Administered 2023-09-18 – 2023-09-19 (×2): 3 mL via INTRAVENOUS

## 2023-09-18 MED ORDER — LORAZEPAM 2 MG/ML IJ SOLN
4.0000 mg | INTRAMUSCULAR | Status: DC | PRN
  Filled 2023-09-18: qty 2

## 2023-09-18 MED ORDER — THIAMINE HCL 100 MG/ML IJ SOLN
500.0000 mg | Freq: Three times a day (TID) | INTRAVENOUS | Status: DC
  Administered 2023-09-19 (×3): 500 mg via INTRAVENOUS
  Filled 2023-09-18 (×4): qty 5

## 2023-09-18 MED ORDER — ACETAMINOPHEN 325 MG RE SUPP: 650.0000 mg | Freq: Four times a day (QID) | RECTAL | Status: DC | PRN

## 2023-09-18 MED ORDER — THIAMINE MONONITRATE 100 MG PO TABS: 100.0000 mg | ORAL_TABLET | Freq: Every day | ORAL | Status: DC

## 2023-09-18 MED ORDER — FOLIC ACID 1 MG PO TABS
1.0000 mg | ORAL_TABLET | Freq: Every day | ORAL | Status: DC
  Administered 2023-09-19: 1 mg via ORAL
  Filled 2023-09-18: qty 1

## 2023-09-18 MED ORDER — ONDANSETRON HCL 4 MG PO TABS: 4.0000 mg | ORAL_TABLET | Freq: Four times a day (QID) | ORAL | Status: DC | PRN

## 2023-09-18 MED ORDER — PHENOBARBITAL SODIUM 65 MG/ML IJ SOLN: 32.5000 mg | Freq: Three times a day (TID) | INTRAMUSCULAR | Status: DC

## 2023-09-18 MED ORDER — PHENOBARBITAL SODIUM 130 MG/ML IJ SOLN
97.5000 mg | Freq: Three times a day (TID) | INTRAMUSCULAR | Status: DC
  Administered 2023-09-18 – 2023-09-19 (×3): 97.5 mg via INTRAVENOUS
  Filled 2023-09-18 (×3): qty 1

## 2023-09-18 MED ORDER — THIAMINE HCL 100 MG/ML IJ SOLN: 100.0000 mg | Freq: Every day | INTRAMUSCULAR | Status: DC

## 2023-09-18 MED ORDER — LORAZEPAM 2 MG/ML IJ SOLN: 1.0000 mg | INTRAMUSCULAR | Status: DC | PRN

## 2023-09-18 MED ORDER — LACTATED RINGERS IV BOLUS
1000.0000 mL | Freq: Once | INTRAVENOUS | Status: AC
  Administered 2023-09-18: 1000 mL via INTRAVENOUS

## 2023-09-18 MED ORDER — ONDANSETRON HCL 4 MG/2ML IJ SOLN: 4.0000 mg | Freq: Four times a day (QID) | INTRAMUSCULAR | Status: DC | PRN

## 2023-09-18 MED ORDER — LEVETIRACETAM (KEPPRA) 500 MG/5 ML ADULT IV PUSH
500.0000 mg | Freq: Once | INTRAVENOUS | Status: DC
  Filled 2023-09-18: qty 5

## 2023-09-18 NOTE — ED Provider Notes (Signed)
 [facility] Provider Note    Event Date/Time   First MD Initiated Contact with Patient 09/18/23 1508     (approximate)   History   Seizures and Alcohol Problem   HPI  Levi Mejia is a 53 y.o. male who presents to the ED for evaluation of Seizures and Alcohol Problem   I reviewed various ED visits for alcohol related complications.  Review of medical DC summary from 7/4.  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  Patient presents from home via EMS for evaluation of multiple seizures.  Per EMS, at least 3 seizures at home and EMS witnessed an additional 4 or 5 generalized tonic-clonic seizures.  Cessation of seizure activity after EMS provided 2.5 mg of IV Versed.  He presents to the ED confused and postictal   Physical Exam   Triage Vital Signs: ED Triage Vitals  Encounter Vitals Group     BP      Girls Systolic BP Percentile      Girls Diastolic BP Percentile      Boys Systolic BP Percentile      Boys Diastolic BP Percentile      Pulse      Resp      Temp      Temp src      SpO2      Weight      Height      Head Circumference      Peak Flow      Pain Score      Pain Loc      Pain Education      Exclude from Growth Chart     Most recent vital signs: Vitals:   09/18/23 1600 09/18/23 1638  BP: 114/80   Pulse: 80   Resp: 11   Temp:  98.3 F (36.8 C)  SpO2: 99%     General: Awake, no distress.  Encephalopathic and nonfocal exam.  Moving all 4 extremities, shifting in bed CV:  Good peripheral perfusion.  Resp:  Normal effort.  Abd:  No distention.  MSK:  No deformity noted.  Poor muscle mass Neuro:  No focal deficits appreciated. Other:     ED Results / Procedures / Treatments   Labs (all labs ordered are listed, but only abnormal results are displayed) Labs Reviewed  MAGNESIUM  - Abnormal; Notable for the following components:      Result Value   Magnesium  1.6 (*)     All other components within normal limits  COMPREHENSIVE METABOLIC PANEL WITH GFR - Abnormal; Notable for the following components:   Sodium 146 (*)    Calcium  8.6 (*)    AST 130 (*)    All other components within normal limits  CBC WITH DIFFERENTIAL/PLATELET - Abnormal; Notable for the following components:   MCH 34.1 (*)    Platelets 97 (*)    All other components within normal limits  PROTIME-INR  URINALYSIS, ROUTINE W REFLEX MICROSCOPIC    EKG Sinus rhythm with a rate of 94 bpm.  Normal axis and intervals without clear signs of acute ischemia.  Wandering baseline.  RADIOLOGY   Official radiology report(s): No results found.  PROCEDURES and INTERVENTIONS:  .1-3 Lead EKG Interpretation  Performed by: Claudene Rover, MD Authorized by: Claudene Rover, MD     Interpretation: normal     ECG rate:  90   ECG rate assessment: normal     Rhythm: sinus rhythm  Ectopy: none     Conduction: normal   .Critical Care  Performed by: Claudene Rover, MD Authorized by: Claudene Rover, MD   Critical care provider statement:    Critical care time (minutes):  30   Critical care time was exclusive of:  Separately billable procedures and treating other patients   Critical care was necessary to treat or prevent imminent or life-threatening deterioration of the following conditions:  CNS failure or compromise   Critical care was time spent personally by me on the following activities:  Development of treatment plan with patient or surrogate, discussions with consultants, evaluation of patient's response to treatment, examination of patient, ordering and review of laboratory studies, ordering and review of radiographic studies, ordering and performing treatments and interventions, pulse oximetry, re-evaluation of patient's condition and review of old charts   Medications  PHENObarbital  (LUMINAL) injection 130 mg (130 mg Intravenous Given 09/18/23 1516)  PHENObarbital  (LUMINAL) injection 130 mg  (130 mg Intravenous Given 09/18/23 1515)  lactated ringers  bolus 1,000 mL (1,000 mLs Intravenous New Bag/Given 09/18/23 1525)  PHENObarbital  (LUMINAL) injection 130 mg (130 mg Intravenous Given 09/18/23 1634)     IMPRESSION / MDM / ASSESSMENT AND PLAN / ED COURSE  I reviewed the triage vital signs and the nursing notes.  Differential diagnosis includes, but is not limited to, alcohol withdrawal seizures, status epilepticus, trauma or head injury, electrolyte derangement, AKI, alcoholic ketoacidosis  {Patient presents with symptoms of an acute illness or injury that is potentially life-threatening.  Patient presents with alcohol withdrawal seizures.  Postictal on arrival and mental status clearing.  Loaded with phenobarbital  and no recurrence of seizure activity here.  No cardiac dysrhythmias or significant metabolic derangements.  Normal CBC.  Will consult medicine for observation admission considering multiple seizures today.  Clinical Course as of 09/18/23 1644  Mon Sep 18, 2023  1523 Reassessed. [DS]  1525 Called our pharmacy to request additional phenobarbital  to restock our Pyxis as we just used the last 2 vials [DS]  1607 reassessed [DS]    Clinical Course User Index [DS] Claudene Rover, MD     FINAL CLINICAL IMPRESSION(S) / ED DIAGNOSES   Final diagnoses:  Alcohol withdrawal seizure with complication [facility])     Rx / DC Orders   ED Discharge Orders     None        Note:  This document was prepared using Dragon voice recognition software and may include unintentional dictation errors.   Claudene Rover, MD 09/18/23 [phone]

## 2023-09-18 NOTE — Progress Notes (Signed)
 Notified Dr. Barbarann at 1750 that patient came from ED without orders for CIWA. Made MD aware that paitent c/o chest tightness 6/10 and headache. MD acknowledged and stated she would place orders. At 1756 notified Dr. Barbarann that patient informed us  that he was getting ready to have a seizure and started starring off and arching his back, bending his knees and sliding his feet up and down in the bed. This lasted to 1 minute and 15 seconds the first time. Patient spoke almost immediately after and answered questions. After initial activity  patient intermittently continued to arch back, stiffen his arms and move legs in bed while starring straight forward in bed, stopping to tell lab tech I hope I'm not showing you my balls and continued the activity. 4 mg of ativan  given per order for seizure activity at 1819. Patient now is A&Ox4 showing pictures of tattoos on his phone that he wants to get.  Continuing to monitor. Dr. Barbarann is aware of all mentioned above.
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
## 2023-09-18 NOTE — ED Triage Notes (Signed)
 Pt arrives via [facility] from home for alcohol withdrawal seizures. Wife witnessed 3 seizures, EMS witnessed 5 tonic clonic seizures. Pt with hx of same and seen on 7/16 and Rx librium . EMS gave 2.5mg  versed en route.   CBG 83

## 2023-09-18 NOTE — H&P (Signed)
 History and Physical    Patient: Levi Mejia FMW:[id number] DOB: [birth year] DOA: 09/18/2023 DOS: the patient was seen and examined on 09/18/2023 PCP: Pcp, No  Patient coming from: Home - lives with wife; NOK: Wife, [name] Grumbles, [phone]   Chief Complaint: Alcohol withdrawal seizures  HPI: Levi Mejia is a 53 y.o. male with medical history significant of severe ETOH use d/o with dependence with withdrawal seizures and hepatic steatosis who presented on 7/21 with seizures.  He was last hospitalized from 7/2-4 for alcohol detox.  He returned to the ER on 7/15 for withdrawal and was discharged.  He is currently somnolent but arousable and oriented x 3.  He reports that he has been drinking, was trying to stop, and suffered from withdrawal seizures.  He recognizes that he needs to stop drinking and has been unsuccessful in his attempts to date.  I spoke with his wife.  Hasn't been feeling well for a couple of days, she has been at work.  Today was very bad.  She heard a noise, walked upstairs, saw him seizing bouncing off the bed.  Previously with seizures, he tensed up and wouldn't respond.  Today, he was with GTC movements, unresponsive.  EMS came, he again had unresponsive episode.  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  She has been looking at rehab options.  [facility] in [place] is $33,000, she already spent $20,000 for a rehab and he came home 2 days later.    After arrival in ICU, patient reported chest pain and headache.  Will check troponin and EKG.  Also with recurrent seizure-like activity, although both ER nurse and ICU nurse report that this is atypical-appearing seizure activity.   ER Course:  ETOH dependence, withdrawing with multiple witnessed seizures today.  Recent admission for the same.  EMS gave Versed.  Arriving postictal and tremulous.  Given phenobarbital  with improvement.     Review of Systems: As  mentioned in the history of present illness. All other systems reviewed and are negative.  Limited by somnolence.  Past Medical History:  Diagnosis Date   Alcohol abuse    Attention deficit disorder without mention of hyperactivity    HTN (hypertension)    Hyperlipidemia 07/03/2022   ICH (intracerebral hemorrhage) (HCC)    Seizures (HCC)    Stroke [facility])    Past Surgical History:  Procedure Laterality Date   ESOPHAGOGASTRODUODENOSCOPY (EGD) WITH PROPOFOL  N/A 07/04/2022   Procedure: ESOPHAGOGASTRODUODENOSCOPY (EGD) WITH PROPOFOL ;  Surgeon: Unk Corinn Skiff, MD;  Location: [facility] ENDOSCOPY;  Service: Endoscopy;  Laterality: N/A;   Hand tendon reattachment  02/28/1990   Right hand   toe reattachment  02/28/1994   Right 1st toe   Social History:  reports that he has never smoked. He has never used smokeless tobacco. He reports current alcohol use. He reports that he does not currently use drugs after having used the following drugs: Marijuana.  No Known Allergies  Family History  Problem Relation Age of Onset   Coronary artery disease Mother 34       Stent   Cancer Mother        Bone   Hypertension Mother    Heart failure Father    Heart attack Father 71   Hypertension Father    Alcohol abuse Father    Hypertension Brother    Diabetes Neg Hx    Prostate cancer Neg Hx    Colon cancer Neg  Hx    Heart disease Neg Hx     Prior to Admission medications   Medication Sig Start Date End Date Taking? Authorizing Provider  aspirin  EC 81 MG tablet Take 1 tablet (81 mg total) by mouth daily. Patient not taking: Reported on 06/26/2023 04/08/22   Alexander, Natalie, DO  diclofenac  Sodium (VOLTAREN ) 1 % GEL Apply 4 g topically 3 (three) times daily as needed. Patient not taking: Reported on 06/26/2023 08/01/22   Dineen Rollene MATSU, FNP  feeding supplement (ENSURE ENLIVE / ENSURE PLUS) LIQD Take 237 mLs by mouth 3 (three) times daily between meals. 10/19/22   Fausto Burnard LABOR, DO  folic acid   (FOLVITE ) 1 MG tablet Take 1 tablet (1 mg total) by mouth daily. Patient not taking: Reported on 06/26/2023 08/18/22   Hope Merle, MD  levETIRAcetam  (KEPPRA ) 500 MG tablet Take 500 mg by mouth 2 (two) times daily. Patient not taking: Reported on 06/26/2023 03/22/23   [provider]  lisinopril  (ZESTRIL ) 5 MG tablet Take 1 tablet (5 mg total) by mouth daily. Patient not taking: Reported on 08/30/2023 06/26/23 09/24/23  Jacolyn Pae, MD  losartan  (COZAAR ) 25 MG tablet Take 0.5 tablets (12.5 mg total) by mouth daily. Patient not taking: Reported on 06/26/2023 08/01/22   Dineen Rollene MATSU, FNP  Multiple Vitamin (MULTIVITAMIN WITH MINERALS) TABS tablet Take 1 tablet by mouth daily. Patient not taking: Reported on 06/26/2023 04/09/22   Alexander, Natalie, DO  ondansetron  (ZOFRAN -ODT) 4 MG disintegrating tablet Take 1 tablet (4 mg total) by mouth every 8 (eight) hours as needed for nausea or vomiting. Patient not taking: Reported on 06/26/2023 12/01/22   Willo Dunnings, MD  pantoprazole  (PROTONIX ) 40 MG tablet Take 1 tablet (40 mg total) by mouth 2 (two) times daily before a meal. Patient not taking: Reported on 06/26/2023 10/06/22   Hope Merle, MD  potassium chloride  SA (KLOR-CON  M) 20 MEQ tablet Take 1 tablet (20 mEq total) by mouth 2 (two) times daily for 5 days. 12/01/22 12/06/22  Willo Dunnings, MD  thiamine  (VITAMIN B1) 100 MG tablet Take 100 mg by mouth daily. Patient not taking: Reported on 06/26/2023 07/04/22   [provider]    Physical Exam: Vitals:   09/18/23 1700 09/18/23 1751 09/18/23 1800 09/18/23 1819  BP: 99/79 123/86 (!) 135/91   Pulse: 83 81 83   Resp: 15 (!) 21 15   Temp:  98.1 F (36.7 C)    TempSrc:  Oral    SpO2: 100% 100% 99%   Weight:    73.6 kg  Height:    5' 10 (1.778 m)   General:  Appears calm and somnolent Eyes:  PERRL, EOMI, normal lids, iris ENT:  grossly normal hearing, lips & tongue (?mild tongue trauma along the left gutter), mmm Neck:  no  LAD, masses or thyromegaly Cardiovascular:  RRR. No LE edema.  Respiratory:   CTA bilaterally with no wheezes/rales/rhonchi.  Normal respiratory effort. Abdomen:  soft, NT, ND Skin:  no rash or induration seen on limited exam Musculoskeletal:  grossly normal tone BUE/BLE, good ROM, no bony abnormality Psychiatric:  somnolent mood and affect, speech sparse but appropriate, AOx3 Neurologic:  CN 2-12 grossly intact, moves all extremities in coordinated fashion   Radiological Exams on Admission: Independently reviewed - see discussion in A/P where applicable  No results found.  EKG: Independently reviewed.  NSR with rate 94 with aberrant complexes; nonspecific ST changes with no evidence of acute ischemia   Labs on Admission: I have  personally reviewed the available labs and imaging studies at the time of the admission.  Pertinent labs:    Na++ 146 Mag++ 1.6 AST 130/ALT 43 WBC 4.4 Platelets 97   Assessment and Plan: Principal Problem:   Alcohol use disorder, severe, dependence (HCC) Active Problems:   Hypomagnesemia   HTN (hypertension)   H/O alcohol dependence (HCC)   Alcohol withdrawal seizure (HCC)   Thrombocytopenia (HCC)    Severe alcohol dependence   Patient with chronic ETOH dependence Number of admissions for management of alcohol withdrawal syndrome (detox): 4 in 6 months with 1 ER visit History of withdrawal seizures, ICU admissions, DTs: yes He is at high risk for complications of withdrawal including seizures, DTs; he is at high risk for needing Precedex to help get through DTs Will observe in progressive care CIWA protocol Phenobarbital  per pharmacy with symptom-triggered BZD (ativan  per CIWA protocol)  Folate, thiamine , and MVI ordered Consider offering a medication for Alcohol Use Disorder at the time of d/c, to include Disulfuram; Naltrexone; or Acamprosate  Possible withdrawal seizures Seizure activity is questionable - prior EEGs including LTM have  been negative EEG ordered STAT but cannot likely get this overnight at [facility], can order Cerebell overnight and get routine EEG tomorrow Will check lactate, prolactin level, ETOH level Discussed with Dr. Jerrie, who will consult Hold Keppra  per neurology  Chest pain Not mentioned to me but mentioned to RN upon arrival in ICU Will check troponin now EKG reassuring; will repeat  Hypomagnesemia Replete and follow  Thrombocytopenia Due to bone marrow suppression from alcohol abuse Monitor daily platelet count   Hepatic steatosis Severe on prior imaging Likely related to ETOH use High risk for developing cirrhosis  HTN Resume lisinopril  (has not been taking at home)  H/o CVA Continue ASA (has not been taking at home)     Advance Care Planning:   Code Status: Full Code   Consults: Neurology  DVT Prophylaxis: SCDs  Family Communication: None present; I spoke with his wife by telephone  Severity of Illness: The appropriate patient status for this patient is OBSERVATION. Observation status is judged to be reasonable and necessary in order to provide the required intensity of service to ensure the patient's safety. The patient's presenting symptoms, physical exam findings, and initial radiographic and laboratory data in the context of their medical condition is felt to place them at decreased risk for further clinical deterioration. Furthermore, it is anticipated that the patient will be medically stable for discharge from the hospital within 2 midnights of admission.   Author: Delon Herald, MD 09/18/2023 6:31 PM  For on call review [email or web address].

## 2023-09-19 ENCOUNTER — Observation Stay: Payer: Self-pay

## 2023-09-19 ENCOUNTER — Other Ambulatory Visit: Payer: Self-pay

## 2023-09-19 ENCOUNTER — Encounter: Payer: Self-pay | Admitting: Internal Medicine

## 2023-09-19 DIAGNOSIS — R569 Unspecified convulsions: Secondary | ICD-10-CM

## 2023-09-19 DIAGNOSIS — Y907 Blood alcohol level of 200-239 mg/100 ml: Secondary | ICD-10-CM

## 2023-09-19 LAB — COMPREHENSIVE METABOLIC PANEL WITH GFR
ALT: 36 U/L (ref 0–44)
AST: 122 U/L — ABNORMAL HIGH (ref 15–41)
Albumin: 3.2 g/dL — ABNORMAL LOW (ref 3.5–5.0)
Alkaline Phosphatase: 74 U/L (ref 38–126)
Anion gap: 12 (ref 5–15)
BUN: 6 mg/dL (ref 6–20)
CO2: 27 mmol/L (ref 22–32)
Calcium: 8.4 mg/dL — ABNORMAL LOW (ref 8.9–10.3)
Chloride: 101 mmol/L (ref 98–111)
Creatinine, Ser: 0.62 mg/dL (ref 0.61–1.24)
GFR, Estimated: >60 mL/min (ref >60)
Glucose, Bld: 79 mg/dL (ref 70–99)
Potassium: 3 mmol/L — ABNORMAL LOW (ref 3.5–5.1)
Sodium: 140 mmol/L (ref 135–145)
Total Bilirubin: 1.4 mg/dL — ABNORMAL HIGH (ref 0.0–1.2)
Total Protein: 6.3 g/dL — ABNORMAL LOW (ref 6.5–8.1)

## 2023-09-19 LAB — PROTIME-INR
INR: 1.2 (ref 0.8–1.2)
Prothrombin Time: 15.9 s — ABNORMAL HIGH (ref 11.4–15.2)

## 2023-09-19 LAB — CBC
HCT: 35.9 % — ABNORMAL LOW (ref 39.0–52.0)
Hemoglobin: 12.3 g/dL — ABNORMAL LOW (ref 13.0–17.0)
MCH: 33.3 pg (ref 26.0–34.0)
MCHC: 34.3 g/dL (ref 30.0–36.0)
MCV: 97.3 fL (ref 80.0–100.0)
Platelets: 65 K/uL — ABNORMAL LOW (ref 150–400)
RBC: 3.69 MIL/uL — ABNORMAL LOW (ref 4.22–5.81)
RDW: 14 % (ref 11.5–15.5)
WBC: 3.7 K/uL — ABNORMAL LOW (ref 4.0–10.5)
nRBC: 0 % (ref 0.0–0.2)

## 2023-09-19 LAB — MAGNESIUM: Magnesium: 1.7 mg/dL (ref 1.7–2.4)

## 2023-09-19 LAB — GLUCOSE, CAPILLARY: Glucose-Capillary: 80 mg/dL (ref 70–99)

## 2023-09-19 MED ORDER — POTASSIUM CHLORIDE CRYS ER 20 MEQ PO TBCR: 40.0000 meq | EXTENDED_RELEASE_TABLET | Freq: Two times a day (BID) | ORAL | Status: DC

## 2023-09-19 MED ORDER — THIAMINE HCL 100 MG PO TABS
100.0000 mg | ORAL_TABLET | Freq: Every day | ORAL | 0 refills | Status: DC
  Filled 2023-09-19: qty 30, 30d supply, fill #0

## 2023-09-19 MED ORDER — ENSURE PLUS HIGH PROTEIN PO LIQD
237.0000 mL | Freq: Two times a day (BID) | ORAL | Status: DC
  Administered 2023-09-19: 237 mL via ORAL

## 2023-09-19 MED ORDER — POTASSIUM CHLORIDE CRYS ER 20 MEQ PO TBCR
40.0000 meq | EXTENDED_RELEASE_TABLET | Freq: Once | ORAL | Status: AC
  Administered 2023-09-19: 40 meq via ORAL
  Filled 2023-09-19: qty 2

## 2023-09-19 MED ORDER — ADULT MULTIVITAMIN W/MINERALS CH
1.0000 | ORAL_TABLET | Freq: Every day | ORAL | 0 refills | Status: DC
  Filled 2023-09-19: qty 30, 30d supply, fill #0

## 2023-09-19 MED ORDER — ASPIRIN 81 MG PO TBEC
81.0000 mg | DELAYED_RELEASE_TABLET | Freq: Every day | ORAL | 0 refills | Status: DC
  Filled 2023-09-19: qty 30, 30d supply, fill #0

## 2023-09-19 MED ORDER — FOLIC ACID 1 MG PO TABS
1.0000 mg | ORAL_TABLET | Freq: Every day | ORAL | 0 refills | Status: DC
  Filled 2023-09-19: qty 30, 30d supply, fill #0

## 2023-09-19 MED ORDER — CHLORHEXIDINE GLUCONATE CLOTH 2 % EX PADS
6.0000 | MEDICATED_PAD | Freq: Every day | CUTANEOUS | Status: DC
  Administered 2023-09-19: 6 via TOPICAL

## 2023-09-19 NOTE — TOC Progression Note (Signed)
 Transition of Care [facility]) - Progression Note    Patient Details  Name: Levi Mejia MRN: [id number] Date of Birth: [birth year]  Transition of Care [facility]) CM/SW Contact  Delphine KANDICE Bring, RN Phone Number: 09/19/2023, 4:37 PM  Clinical Narrative:    No family at bedside. Cm spoke with patient. CM asked if he would like substance about resources . Patient states his wife has it handled. Cm asked for permission to call wife. Patient states yes.CM asked if patient was employed. He stated no. Patient states he has a 21 year old daughter. He denies PCP at this time. CM asked when he is medically ready for d/c who will transport his home. Patient states that he would take an [place].      CM called wife went straight to voice mail.                     Expected Discharge Plan and Services                                              Social Drivers of Health (SDOH) Interventions SDOH Screenings   Food Insecurity: No Food Insecurity (09/18/2023)  Housing: Low Risk  (09/18/2023)  Transportation Needs: No Transportation Needs (09/18/2023)  Utilities: Not At Risk (09/18/2023)  Alcohol Screen: Medium Risk (08/08/2022)  Depression (PHQ2-9): Low Risk  (10/14/2022)  Financial Resource Strain: Low Risk  (03/22/2023)   Received from [facility]  Physical Activity: Sufficiently Active (08/08/2022)  Recent Concern: Physical Activity - Inactive (07/14/2022)  Social Connections: Unknown (08/08/2022)  Recent Concern: Social Connections - Moderately Isolated (08/08/2022)  Stress: No Stress Concern Present (08/08/2022)  Tobacco Use: Low Risk  (09/19/2023)    Readmission Risk Interventions    10/18/2022    3:02 PM  Readmission Risk Prevention Plan  Transportation Screening Complete  PCP or Specialist Appt within 3-5 Days Complete  HRI or Home Care Consult Complete  Social Work Consult for Recovery Care Planning/Counseling Complete  Palliative Care Screening Not Complete   Medication Review Oceanographer) Complete

## 2023-09-19 NOTE — Hospital Course (Signed)
 53yo with h/o ETOH use d/o with dependence with withdrawal seizures and hepatic steatosis who presented on 7/21 with seizures (possibly pseudoseizures).  He was loaded with phenobarbital , EEG ordered, neurology consulting.  ETOH level was >200 on presentation.

## 2023-09-19 NOTE — Consult Note (Signed)
 NEUROLOGY CONSULT NOTE   Date of service: September 19, 2023 Levi Mejia Name: Levi Mejia MRN:  [id number] DOB:  26-Mar-1970 Chief Complaint: seizures due to drinking Requesting Provider: Barbarann Nest, MD  History of Present Illness  Levi Mejia is a 53 y.o. male with hx of alcoholism complicated by seizures, remote right occipital IPH  I am consulted for seizure management. Of note Levi Mejia was recommended to take keppra  per [facility] neurology on admission in Jan 2025 due to concern that at least one of his seizures started as left sided focal seizure with secondary generalization which could localize to the right occipital ICH. However Levi Mejia does not recall being given this medication and notes his seizures are only in the setting of drinking. Per prior notes no other seizure risk factors.   Levi Mejia describes that Levi Mejia can feel them coming on but does not endorse any specific aura when asked for specific typical aura symptoms including vision changes or left sided shaking or post-event left sided weakness (notes just generalized weakness). Levi Mejia reports Levi Mejia is otherwise amnestic to the event.   Event as described in nursing note this admission sounds atypical for seizure Levi Mejia informed us  that Levi Mejia was getting ready to have a seizure and started starring off and arching his back, bending his knees and sliding his feet up and down in the bed. This lasted to 1 minute and 15 seconds the first time. Levi Mejia spoke almost immediately after and answered questions. After initial activity Levi Mejia intermittently continued to arch back, stiffen his arms and move legs in bed while starring straight forward in bed, stopping to tell lab tech I hope I'm not showing you my balls and continued the activity. 4 mg of ativan  given per order for seizure activity at 1819. Levi Mejia now is Levi Mejia showing pictures of tattoos on his phone that Levi Mejia wants to get.  Received phenobarbitatl 130 mg x 3 doses 1515, 1516, 1634; now  on a taper   Levi Mejia has had several prior EEGs though Levi Mejia only recalls one. These have been negative but also generally in the setting of heavy doses of phenobarb / benzos for alcohol withdrawal      ROS  Comprehensive ROS performed and pertinent positives documented in HPI within limits of cognitive impairment due to frequent intoxication  Past History   Past Medical History:  Diagnosis Date   Alcohol abuse    Attention deficit disorder without mention of hyperactivity    HTN (hypertension)    Hyperlipidemia 07/03/2022   ICH (intracerebral hemorrhage) (HCC)    Seizures (HCC)    Stroke [facility])     Past Surgical History:  Procedure Laterality Date   ESOPHAGOGASTRODUODENOSCOPY (EGD) WITH PROPOFOL  N/A 07/04/2022   Procedure: ESOPHAGOGASTRODUODENOSCOPY (EGD) WITH PROPOFOL ;  Surgeon: Unk Corinn Skiff, MD;  Location: [facility] ENDOSCOPY;  Service: Endoscopy;  Laterality: N/A;   Hand tendon reattachment  02/28/1990   Right hand   toe reattachment  02/28/1994   Right 1st toe    Family History: Family History  Problem Relation Age of Onset   Coronary artery disease Mother 87       Stent   Cancer Mother        Bone   Hypertension Mother    Heart failure Father    Heart attack Father 80   Hypertension Father    Alcohol abuse Father    Hypertension Brother    Diabetes Neg Hx    Prostate cancer Neg Hx    Colon cancer Neg Hx  Heart disease Neg Hx     Social History  reports that Levi Mejia has never smoked. Levi Mejia has never used smokeless tobacco. Levi Mejia reports current alcohol use. Levi Mejia reports that Levi Mejia does not currently use drugs after having used the following drugs: Marijuana.  No Known Allergies  Medications   Current Facility-Administered Medications:    acetaminophen  (TYLENOL ) tablet 650 mg, 650 mg, Oral, Q6H PRN **OR** acetaminophen  (TYLENOL ) suppository 650 mg, 650 mg, Rectal, Q6H PRN, Barbarann Nest, MD   aspirin  EC tablet 81 mg, 81 mg, Oral, Daily, Barbarann Nest, MD   bisacodyl   (DULCOLAX) EC tablet 5 mg, 5 mg, Oral, Daily PRN, Barbarann Nest, MD   Chlorhexidine  Gluconate Cloth 2 % PADS 6 each, 6 each, Topical, Daily, Barbarann Nest, MD   docusate sodium  (COLACE) capsule 100 mg, 100 mg, Oral, BID, Barbarann Nest, MD   folic acid  (FOLVITE ) tablet 1 mg, 1 mg, Oral, Daily, Barbarann Nest, MD   hydrALAZINE  (APRESOLINE ) injection 5 mg, 5 mg, Intravenous, Q4H PRN, Barbarann Nest, MD   lactated ringers  infusion, , Intravenous, Continuous, Barbarann Nest, MD, Last Rate: 75 mL/hr at 09/19/23 9285, Infusion Verify at 09/19/23 9285   lisinopril  (ZESTRIL ) tablet 5 mg, 5 mg, Oral, Daily, Barbarann Nest, MD   LORazepam  (ATIVAN ) tablet 1-4 mg, 1-4 mg, Oral, Q1H PRN **OR** LORazepam  (ATIVAN ) injection 1-4 mg, 1-4 mg, Intravenous, Q1H PRN, Barbarann Nest, MD   LORazepam  (ATIVAN ) injection 4 mg, 4 mg, Intravenous, Q5 Min x 2 PRN, Barbarann Nest, MD, 4 mg at 09/18/23 1819   multivitamin with minerals tablet 1 tablet, 1 tablet, Oral, Daily, Barbarann Nest, MD   ondansetron  (ZOFRAN ) tablet 4 mg, 4 mg, Oral, Q6H PRN **OR** ondansetron  (ZOFRAN ) injection 4 mg, 4 mg, Intravenous, Q6H PRN, Barbarann Nest, MD   PHENObarbital  (LUMINAL) injection 97.5 mg, 97.5 mg, Intravenous, Q8H, 97.5 mg at 09/19/23 0617 **FOLLOWED BY** [START ON 09/20/2023] PHENObarbital  (LUMINAL) injection 65 mg, 65 mg, Intravenous, Q8H **FOLLOWED BY** [START ON 09/22/2023] PHENObarbital  (LUMINAL) injection 32.5 mg, 32.5 mg, Intravenous, Q8H, Barbarann Nest, MD   polyethylene glycol (MIRALAX  / GLYCOLAX ) packet 17 g, 17 g, Oral, Daily PRN, Barbarann Nest, MD   potassium chloride  SA (KLOR-CON  M) CR tablet 40 mEq, 40 mEq, Oral, Once, Mansy, Jan A, MD   sodium chloride  flush (NS) 0.9 % injection 3 mL, 3 mL, Intravenous, Q12H, Barbarann Nest, MD, 3 mL at 09/18/23 2211   thiamine  (VITAMIN B1) 500 mg in sodium chloride  0.9 % 50 mL IVPB, 500 mg, Intravenous, Q8H, Stopped at 09/19/23 0101 **FOLLOWED BY** [START ON 09/21/2023]  thiamine  (VITAMIN B1) 250 mg in sodium chloride  0.9 % 50 mL IVPB, 250 mg, Intravenous, Daily **FOLLOWED BY** [START ON 09/27/2023] thiamine  (VITAMIN B1) injection 100 mg, 100 mg, Intravenous, Daily, Khoa Opdahl L, MD  Vitals   Vitals:   09/19/23 0630 09/19/23 0645 09/19/23 0700 09/19/23 0726  BP: (!) 137/97  113/78   Pulse: 70 68 64   Resp: 11 11 12    Temp:    98.1 F (36.7 C)  TempSrc:    Oral  SpO2: 96% 98% 94%   Weight:      Height:        Body mass index is 23.28 kg/m.   Physical Exam   Constitutional: Appears well-developed and well-nourished.  Psych: Affect appropriate to situation, calm, cooperative Eyes: No scleral injection HENT: No oropharyngeal obstruction.  MSK: no joint deformities.  Cardiovascular: Normal rate and regular rhythm. Perfusing extremities well Respiratory: Effort normal, non-labored breathing GI: Soft.  No distension. There is no tenderness.  Skin: Warm dry and intact visible skin  Neurologic Examination   Mental Status: Levi Mejia is awake, alert, oriented to person, place, month, year, and situation but not date (reports 15th instead of 22nd) Levi Mejia is able to give a clear and coherent history. No signs of aphasia or neglect Cranial Nerves: II: Visual Fields are full. Pupils are equal, round, and reactive to light.   III,IV, VI: EOMI other than chronic left lazy eye  V: Facial sensation is symmetric to temperature VII: Facial movement is symmetric.  VIII: hearing is intact to voice X: Uvula elevates symmetrically XI: Shoulder shrug is symmetric. XII: tongue is midline without atrophy or fasciculations.  Motor: Tone is normal. Bulk is normal. 5/5 strength was present in all four extremities other than mild bilateral deltoid 4+/5 weakness Sensory: Sensation is symmetric to light touch and temperature in the arms and legs. Cerebellar: FNF and HKS are intact bilaterally Gait:  Deferred in acute setting    Labs/Imaging/Neurodiagnostic  studies   CBC:  Recent Labs  Lab 2023-10-14 1522 09/19/23 0405  WBC 4.4 3.7*  NEUTROABS 2.3  --   HGB 14.4 12.3*  HCT 40.9 35.9*  MCV 96.9 97.3  PLT 97* 65*   Basic Metabolic Panel:  Lab Results  Component Value Date   NA 140 09/19/2023   K 3.0 (L) 09/19/2023   CO2 27 09/19/2023   GLUCOSE 79 09/19/2023   BUN 6 09/19/2023   CREATININE 0.62 09/19/2023   CALCIUM  8.4 (L) 09/19/2023   GFRNONAA >60 09/19/2023   GFRAA >60 06/21/2018   Lipid Panel:  Lab Results  Component Value Date   LDLCALC 118 (H) 06/23/2022   HgbA1c:  Lab Results  Component Value Date   HGBA1C 5.1 06/23/2022   Urine Drug Screen:     Component Value Date/Time   LABOPIA NONE DETECTED 06/20/2018 2218   COCAINSCRNUR NONE DETECTED 06/20/2018 2218   LABBENZ NONE DETECTED 06/20/2018 2218   AMPHETMU NONE DETECTED 06/20/2018 2218   THCU NONE DETECTED 06/20/2018 2218   LABBARB NONE DETECTED 06/20/2018 2218    Alcohol Level     Component Value Date/Time   ETH 204 (H) 10-14-2023 1829   INR  Lab Results  Component Value Date   INR 1.2 09/19/2023   APTT  Lab Results  Component Value Date   APTT 30 04/25/2023   Lactic Acid, Venous    Component Value Date/Time   LATICACIDVEN 3.6 (HH) Oct 14, 2023 2246     CT Head without contrast(Personally reviewed): 1. No evidence of acute intracranial abnormality. 2. Similar chronic microvascular ischemic disease. 3. Opacified right maxillary sinus and anterior right ethmoid air cells.  MRI Brain(Personally reviewed, agree with radiology) 02/05/2023: Multiple foci of hemosiderin deposition in the bilateral cerebral hemispheres and basal ganglia, favored to represent sequela prior hypertensive microhemorrhages. Redemonstrated more focal area of hemosiderin deposition in the medial right occipital lobe sulcus (series 13, image 33). Mildly advanced cerebral volume loss for age. Multiple remote lacunar infarcts in the basal ganglia and corona radiata. Dilated  perivascular spaces in the basal ganglia.    ASSESSMENT   Thane Age is a 53 y.o. male hx of alcoholism complicated by seizures, remote right occipital IPH. Presented with concern for seizure like activity yesterday, Ethanol level > 204 makes seizures less likely but description also inconsistent with seizures. As Levi Mejia is generally non-adherent to medications when drinking and continues to struggle with drinking primary treatment at this time should center on  treatment of his addiction issues. Do not think there is much benefit to adding keppra  until / unless Levi Mejia has further events when medically stabilized. May also benefit from EMU monitoring to clarify if Levi Mejia does have underlying epilepsy or not, which could be arranged outpatient if Levi Mejia continues to do well without further events while inpatient   RECOMMENDATIONS  - Follow up Prolactin collected at 18:29 7/21, within 30 minutes of seizure-like episode (started at 1756 and ongoing for several minutes) -- note low sensitivity especially given collection > 20 minutes after event, but if elevated would support potential GTC as the etiology - Routine EEG cancelled as back to baseline at this time and would not change management - CIWAS per primary team - Repeat Magnesium  level ordered this AM; continue to aggressively supplement electrolytes  - High dose thiamine  Wernicke's dosing (low risk, high benefit) - If continues to have further events with lack of clarity on whether seizures or pseudoseizures may need transfer for cEEG but will hold off for now - Inpatient neurology will sign off at this time but please reach out if additional questions/concerns arise including additional concern for seizure like activity - Discussed with Dr. Barbarann via phone and secure chat  ______________________________________________________________________   Lola Jernigan MD-PhD [facility] [phone] [facility] coverage  for [facility] is from 8 AM to 4 AM in-house and 4 PM to 8 PM by telephone/video. 8 PM to 8 AM emergent questions or overnight urgent questions should be addressed to Teleneurology On-call or Jolynn Pack neurohospitalist; contact information can be found on AMION

## 2023-09-19 NOTE — Plan of Care (Addendum)
 The patient has been discharged. IV has been removed. Education has been used using the teach back method.   Problem: Education: Goal: Expressions of having a comfortable level of knowledge regarding the disease process will increase Outcome: Completed/Met   Problem: Coping: Goal: Ability to adjust to condition or change in health will improve Outcome: Completed/Met Goal: Ability to identify appropriate support needs will improve Outcome: Completed/Met   Problem: Health Behavior/Discharge Planning: Goal: Compliance with prescribed medication regimen will improve Outcome: Completed/Met   Problem: Medication: Goal: Risk for medication side effects will decrease Outcome: Completed/Met   Problem: Clinical Measurements: Goal: Complications related to the disease process, condition or treatment will be avoided or minimized Outcome: Completed/Met Goal: Diagnostic test results will improve Outcome: Completed/Met   Problem: Safety: Goal: Verbalization of understanding the information provided will improve Outcome: Completed/Met   Problem: Self-Concept: Goal: Level of anxiety will decrease Outcome: Completed/Met Goal: Ability to verbalize feelings about condition will improve Outcome: Completed/Met   Problem: Education: Goal: Knowledge of General Education information will improve Description: Including pain rating scale, medication(s)/side effects and non-pharmacologic comfort measures Outcome: Completed/Met   Problem: Health Behavior/Discharge Planning: Goal: Ability to manage health-related needs will improve Outcome: Completed/Met   Problem: Clinical Measurements: Goal: Ability to maintain clinical measurements within normal limits will improve Outcome: Completed/Met Goal: Will remain free from infection Outcome: Completed/Met Goal: Diagnostic test results will improve Outcome: Completed/Met Goal: Respiratory complications will improve Outcome: Completed/Met Goal:  Cardiovascular complication will be avoided Outcome: Completed/Met   Problem: Activity: Goal: Risk for activity intolerance will decrease Outcome: Completed/Met   Problem: Nutrition: Goal: Adequate nutrition will be maintained Outcome: Completed/Met   Problem: Coping: Goal: Level of anxiety will decrease Outcome: Completed/Met   Problem: Elimination: Goal: Will not experience complications related to bowel motility Outcome: Completed/Met Goal: Will not experience complications related to urinary retention Outcome: Completed/Met   Problem: Pain Managment: Goal: General experience of comfort will improve and/or be controlled Outcome: Completed/Met   Problem: Safety: Goal: Ability to remain free from injury will improve Outcome: Completed/Met   Problem: Skin Integrity: Goal: Risk for impaired skin integrity will decrease Outcome: Completed/Met

## 2023-09-19 NOTE — Plan of Care (Signed)
  Problem: Coping: Goal: Ability to adjust to condition or change in health will improve Outcome: Progressing   Problem: Clinical Measurements: Goal: Diagnostic test results will improve Outcome: Progressing   Problem: Education: Goal: Knowledge of General Education information will improve Description: Including pain rating scale, medication(s)/side effects and non-pharmacologic comfort measures Outcome: Progressing   Problem: Clinical Measurements: Goal: Ability to maintain clinical measurements within normal limits will improve Outcome: Progressing

## 2023-09-19 NOTE — Discharge Summary (Signed)
 Physician Discharge Summary   Patient: Levi Mejia MRN: [id number] DOB: 1970/04/02  Admit date:     09/18/2023  Discharge date: 09/19/23  Discharge Physician: Delon Herald   PCP: Pcp, No   Recommendations at discharge:   Go straight to [facility] for assistance with long-term alcohol cessation Do NOT drink alcohol; this places you at significant risk including death Thiamine , folic acid , and multivitamins have been called in to the pharmacy at [facility] and can be picked up after release from Behavioral Health  Discharge Diagnoses: Principal Problem:   Alcohol use disorder, severe, dependence (HCC) Active Problems:   Hypomagnesemia   HTN (hypertension)   H/O alcohol dependence (HCC)   Alcohol withdrawal seizure (HCC)   Thrombocytopenia [facility])    Hospital Course: 53yo with h/o ETOH use d/o with dependence with withdrawal seizures and hepatic steatosis who presented on 7/21 with seizures (possibly pseudoseizures).  He was loaded with phenobarbital , EEG ordered, neurology consulting.  ETOH level was >200 on presentation.  Assessment and Plan:  Severe alcohol dependence   Patient with chronic ETOH dependence Number of admissions for management of alcohol withdrawal syndrome (detox): 4 in 6 months with 1 ER visit History of withdrawal seizures, ICU admissions, DTs: yes He is at high risk for complications of withdrawal including seizures, DTs but is not actively experiencing frank withdrawal at this time with CIWA scores all less than 4 since presentation Observed in SDU CIWA protocol Phenobarbital  per pharmacy with symptom-triggered BZD (ativan  per CIWA protocol)  - will hold further phenobarb at time of dc and defer to [facility], as he appears likely to need inpatient hospitalization for ongoing substance dependence Folate, thiamine , and MVI ordered Consider offering a medication for Alcohol Use Disorder at the time of d/c, to include  Disulfuram; Naltrexone; or Acamprosate Patient now acknowledges lying about ETOH use, drank 1/5 yesterday prior to presentation   Possible withdrawal seizures Seizure activity is questionable - prior EEGs including LTM have been negative EEG ordered but canceled by neurology since it would not change management and he is back to baseline Neurology consulted, has signed off No further episodes   Chest pain Not mentioned to me but mentioned to RN upon arrival in ICU Troponin negative x 2 EKG reassuring   Thrombocytopenia Due to bone marrow suppression from alcohol abuse   Hepatic steatosis Severe on prior imaging Likely related to ETOH use High risk for developing cirrhosis   HTN Resume lisinopril  (has not been taking at home)   H/o CVA Continue ASA (has not been taking at home)       Consultants: Neurology [facility] team   Procedures: EEG 7/22   Antibiotics: None   Pain control - [place]  Controlled Substance Reporting System database was reviewed. and patient was instructed, not to drive, operate heavy machinery, perform activities at heights, swimming or participation in water activities or provide baby-sitting services while on Pain, Sleep and Anxiety Medications; until their outpatient Physician has advised to do so again. Also recommended to not to take more than prescribed Pain, Sleep and Anxiety Medications.   Disposition: Home Diet recommendation:  Regular diet DISCHARGE MEDICATION: Allergies as of 09/19/2023   No Known Allergies      Medication List     STOP taking these medications    lisinopril  5 MG tablet Commonly known as: ZESTRIL    potassium chloride  SA 20 MEQ tablet Commonly known as: KLOR-CON  M       TAKE these medications  aspirin  EC 81 MG tablet Take 1 tablet (81 mg total) by mouth daily. Swallow whole. Start taking on: September 20, 2023 What changed: additional instructions   folic acid  1 MG tablet Commonly known as:  FOLVITE  Take 1 tablet (1 mg total) by mouth daily. Start taking on: September 20, 2023   multivitamin with minerals Tabs tablet Take 1 tablet by mouth daily. Start taking on: September 20, 2023   thiamine  100 MG tablet Commonly known as: VITAMIN B1 Take 1 tablet (100 mg total) by mouth daily.        Discharge Exam:   Subjective: Continued to report last ETOH was 3 days ago.  When confronted with his ETOH level, he acknowledged drinking 1/5 on day of presentation.  He does want to stop drinking and is planned rehab but is uncertain what this will look like.  He feels like he could go home today.   I spoke with his wife.  They are considering contact [facility].  They cannot afford another rehab.  They will take him to [facility].  She will come and pick him up - no Gisele!   Objective: Vitals:   09/19/23 1500 09/19/23 1600  BP: 116/81 114/78  Pulse: 77 74  Resp: 16 14  Temp:  98.7 F (37.1 C)  SpO2: 100% 96%    Intake/Output Summary (Last 24 hours) at 09/19/2023 1742 Last data filed at 09/19/2023 1601 Gross per 24 hour  Intake 2568.53 ml  Output 1000 ml  Net 1568.53 ml   Filed Weights   09/18/23 1819  Weight: 73.6 kg    Exam:  General:  Appears calm and somnolent Eyes:  PERRL, EOMI, normal lids, iris ENT:  grossly normal hearing, lips & tongue (?mild tongue trauma along the left gutter), mmm Neck:  no LAD, masses or thyromegaly Cardiovascular:  RRR. No LE edema.  Respiratory:   CTA bilaterally with no wheezes/rales/rhonchi.  Normal respiratory effort. Abdomen:  soft, NT, ND Skin:  no rash or induration seen on limited exam Musculoskeletal:  grossly normal tone BUE/BLE, good ROM, no bony abnormality Psychiatric:  somnolent mood and affect, speech sparse but appropriate, AOx3 Neurologic:  CN 2-12 grossly intact, moves all extremities in coordinated fashion  Data Reviewed: I have reviewed the patient's lab results since admission.   Pertinent labs for today include:   K+ 3.0, repleted Albumin 3.2 AST 122/ALT 36/Bili 1.4, 130/43/1.0 on presentation WBC 3.7 Hgb 12.3 Platelets 65 HS troponin 6, 6 Lactate 3.4, 3.6 INR 1.2 ETOH 204      Condition at discharge: serious  The results of significant diagnostics from this hospitalization (including imaging, microbiology, ancillary and laboratory) are listed below for reference.   Imaging Studies: CT Head Wo Contrast Result Date: 08/30/2023 CLINICAL DATA:  Seizure, new-onset, no history of trauma likely ETOH withdrawal seizures EXAM: CT HEAD WITHOUT CONTRAST TECHNIQUE: Contiguous axial images were obtained from the base of the skull through the vertex without intravenous contrast. RADIATION DOSE REDUCTION: This exam was performed according to the departmental dose-optimization program which includes automated exposure control, adjustment of the mA and/or kV according to patient size and/or use of iterative reconstruction technique. COMPARISON:  CT head April 27, 25. FINDINGS: Brain: No evidence of acute infarction, hemorrhage, hydrocephalus, extra-axial collection or mass lesion/mass effect. Similar patchy white matter hypodensities including remote lacunar infarcts in the bifrontal white matter, compatible with chronic microvascular ischemic disease. Vascular: No hyperdense vessel. Skull: No acute fracture. Sinuses/Orbits: Opacified right maxillary sinus  and anterior right ethmoid air cells. Other: No mastoid effusions. IMPRESSION: 1. No evidence of acute intracranial abnormality. 2. Similar chronic microvascular ischemic disease. 3. Opacified right maxillary sinus and anterior right ethmoid air cells. Electronically Signed   By: Gilmore GORMAN Molt M.D.   On: 08/30/2023 04:02   DG Chest 2 View Result Date: 08/29/2023 CLINICAL DATA:  Chest pain EXAM: CHEST - 2 VIEW COMPARISON:  X-ray 02/08/2023.  CT scan 06/25/2023. FINDINGS: No consolidation, pneumothorax or effusion. No edema.  Normal cardiopericardial silhouette. IMPRESSION: No acute cardiopulmonary disease. Electronically Signed   By: Ranell Bring M.D.   On: 08/29/2023 16:41    Microbiology: Results for orders placed or performed during the hospital encounter of 09/18/23  MRSA Next Gen by PCR, Nasal     Status: None   Collection Time: 09/18/23  6:24 PM   Specimen: Nasal Mucosa; Nasal Swab  Result Value Ref Range Status   MRSA by PCR Next Gen NOT DETECTED NOT DETECTED Final    Comment: (NOTE) The GeneXpert MRSA Assay (FDA approved for NASAL specimens only), is one component of a comprehensive MRSA colonization surveillance program. It is not intended to diagnose MRSA infection nor to guide or monitor treatment for MRSA infections. Test performance is not FDA approved in patients less than 59 years old. Performed at [facility], [address] [place], [place] [address]     Labs: CBC: Recent Labs  Lab 09/18/23 1522 09/19/23 0405  WBC 4.4 3.7*  NEUTROABS 2.3  --   HGB 14.4 12.3*  HCT 40.9 35.9*  MCV 96.9 97.3  PLT 97* 65*   Basic Metabolic Panel: Recent Labs  Lab 09/18/23 1522 09/19/23 0405  NA 146* 140  K 3.5 3.0*  CL 104 101  CO2 27 27  GLUCOSE 86 79  BUN 6 6  CREATININE 0.78 0.62  CALCIUM  8.6* 8.4*  MG 1.6* 1.7   Liver Function Tests: Recent Labs  Lab 09/18/23 1522 09/19/23 0405  AST 130* 122*  ALT 43 36  ALKPHOS 90 74  BILITOT 1.0 1.4*  PROT 7.1 6.3*  ALBUMIN 3.7 3.2*   CBG: Recent Labs  Lab 09/18/23 1746  GLUCAP 80    Discharge time spent: greater than 30 minutes.  Signed: Delon Herald, MD [facility] 09/19/2023

## 2023-09-20 ENCOUNTER — Other Ambulatory Visit: Payer: Self-pay

## 2023-09-20 LAB — PROLACTIN: Prolactin: 10.5 ng/mL (ref 3.6–25.2)

## 2023-09-21 LAB — VITAMIN B1: Vitamin B1 (Thiamine): 91.6 nmol/L (ref 66.5–200.0)

## 2023-09-22 ENCOUNTER — Ambulatory Visit: Payer: Self-pay | Admitting: Nurse Practitioner

## 2023-10-02 ENCOUNTER — Other Ambulatory Visit: Payer: Self-pay

## 2023-10-10 ENCOUNTER — Other Ambulatory Visit: Payer: Self-pay

## 2023-10-10 ENCOUNTER — Emergency Department
Admission: EM | Admit: 2023-10-10 | Discharge: 2023-10-12 | Disposition: A | Discharge status: Other Acute Inpatient Hospital | Payer: Self-pay | Attending: Emergency Medicine | Admitting: Emergency Medicine

## 2023-10-10 ENCOUNTER — Emergency Department: Payer: Self-pay

## 2023-10-10 DIAGNOSIS — R112 Nausea with vomiting, unspecified: Secondary | ICD-10-CM | POA: Insufficient documentation

## 2023-10-10 DIAGNOSIS — I10 Essential (primary) hypertension: Secondary | ICD-10-CM | POA: Insufficient documentation

## 2023-10-10 DIAGNOSIS — F32A Depression, unspecified: Secondary | ICD-10-CM | POA: Insufficient documentation

## 2023-10-10 DIAGNOSIS — F1020 Alcohol dependence, uncomplicated: Secondary | ICD-10-CM | POA: Insufficient documentation

## 2023-10-10 DIAGNOSIS — R7989 Other specified abnormal findings of blood chemistry: Secondary | ICD-10-CM

## 2023-10-10 DIAGNOSIS — R7401 Elevation of levels of liver transaminase levels: Secondary | ICD-10-CM | POA: Insufficient documentation

## 2023-10-10 DIAGNOSIS — F1010 Alcohol abuse, uncomplicated: Secondary | ICD-10-CM

## 2023-10-10 LAB — CBC WITH DIFFERENTIAL/PLATELET
Abs Immature Granulocytes: 0.04 K/uL (ref 0.00–0.07)
Basophils Absolute: 0.2 K/uL — ABNORMAL HIGH (ref 0.0–0.1)
Basophils Relative: 2 %
Eosinophils Absolute: 0.5 K/uL (ref 0.0–0.5)
Eosinophils Relative: 6 %
HCT: 48.5 % (ref 39.0–52.0)
Hemoglobin: 17.2 g/dL — ABNORMAL HIGH (ref 13.0–17.0)
Immature Granulocytes: 1 %
Lymphocytes Relative: 29 %
Lymphs Abs: 2.4 K/uL (ref 0.7–4.0)
MCH: 33.7 pg (ref 26.0–34.0)
MCHC: 35.5 g/dL (ref 30.0–36.0)
MCV: 94.9 fL (ref 80.0–100.0)
Monocytes Absolute: 0.4 K/uL (ref 0.1–1.0)
Monocytes Relative: 5 %
Neutro Abs: 4.9 K/uL (ref 1.7–7.7)
Neutrophils Relative %: 57 %
Platelets: 148 K/uL — ABNORMAL LOW (ref 150–400)
RBC: 5.11 MIL/uL (ref 4.22–5.81)
RDW: 12.9 % (ref 11.5–15.5)
WBC: 8.5 K/uL (ref 4.0–10.5)
nRBC: 0 % (ref 0.0–0.2)

## 2023-10-10 LAB — COMPREHENSIVE METABOLIC PANEL WITH GFR
ALT: 55 U/L — ABNORMAL HIGH (ref 0–44)
AST: 241 U/L — ABNORMAL HIGH (ref 15–41)
Albumin: 4.1 g/dL (ref 3.5–5.0)
Alkaline Phosphatase: 106 U/L (ref 38–126)
Anion gap: 20 — ABNORMAL HIGH (ref 5–15)
BUN: 6 mg/dL (ref 6–20)
CO2: 24 mmol/L (ref 22–32)
Calcium: 8.9 mg/dL (ref 8.9–10.3)
Chloride: 99 mmol/L (ref 98–111)
Creatinine, Ser: 0.94 mg/dL (ref 0.61–1.24)
GFR, Estimated: >60 mL/min (ref >60)
Glucose, Bld: 132 mg/dL — ABNORMAL HIGH (ref 70–99)
Potassium: 3.4 mmol/L — ABNORMAL LOW (ref 3.5–5.1)
Sodium: 143 mmol/L (ref 135–145)
Total Bilirubin: 1.3 mg/dL — ABNORMAL HIGH (ref 0.0–1.2)
Total Protein: 8.6 g/dL — ABNORMAL HIGH (ref 6.5–8.1)

## 2023-10-10 LAB — ETHANOL: Alcohol, Ethyl (B): 345 mg/dL (ref <15)

## 2023-10-10 MED ORDER — THIAMINE HCL 100 MG/ML IJ SOLN: 100.0000 mg | Freq: Every day | INTRAMUSCULAR | Status: DC

## 2023-10-10 MED ORDER — THIAMINE MONONITRATE 100 MG PO TABS
100.0000 mg | ORAL_TABLET | Freq: Every day | ORAL | Status: DC
  Administered 2023-10-11 – 2023-10-12 (×3): 100 mg via ORAL
  Filled 2023-10-10 (×2): qty 1

## 2023-10-10 MED ORDER — LORAZEPAM 2 MG/ML IJ SOLN: 0.0000 mg | Freq: Four times a day (QID) | INTRAMUSCULAR | Status: DC

## 2023-10-10 MED ORDER — LORAZEPAM 2 MG PO TABS: 0.0000 mg | ORAL_TABLET | Freq: Two times a day (BID) | ORAL | Status: DC

## 2023-10-10 MED ORDER — LORAZEPAM 2 MG PO TABS
0.0000 mg | ORAL_TABLET | Freq: Four times a day (QID) | ORAL | Status: DC
  Administered 2023-10-10 – 2023-10-11 (×4): 2 mg via ORAL
  Administered 2023-10-11: 1 mg via ORAL
  Administered 2023-10-11: 2 mg via ORAL
  Administered 2023-10-11: 1 mg via ORAL
  Administered 2023-10-11: 2 mg via ORAL
  Administered 2023-10-12 (×2): 1 mg via ORAL
  Filled 2023-10-10 (×6): qty 1

## 2023-10-10 MED ORDER — SODIUM CHLORIDE 0.9 % IV BOLUS
1000.0000 mL | Freq: Once | INTRAVENOUS | Status: AC
  Administered 2023-10-10 (×2): 1000 mL via INTRAVENOUS

## 2023-10-10 MED ORDER — ONDANSETRON HCL 4 MG/2ML IJ SOLN
4.0000 mg | Freq: Once | INTRAMUSCULAR | Status: AC
  Administered 2023-10-10 (×2): 4 mg via INTRAVENOUS
  Filled 2023-10-10: qty 2

## 2023-10-10 MED ORDER — LORAZEPAM 2 MG/ML IJ SOLN: 0.0000 mg | Freq: Two times a day (BID) | INTRAMUSCULAR | Status: DC

## 2023-10-10 NOTE — ED Notes (Signed)
 TTS at bedside.

## 2023-10-10 NOTE — ED Provider Notes (Signed)
 [facility] Provider Note    Event Date/Time   First MD Initiated Contact with Patient 10/10/23 2155     (approximate)   History   Alcohol Intoxication   HPI  Levi Mejia is a 53 y.o. male with history of hypertension, alcohol use, presenting with nausea vomiting since earlier today.  Reports drinking all day.  States that he was brought in because he had made suicidal statements earlier but states that he does not have any SI or HI at this time.  He denies any abdominal pain or any other complaints.  He is willing to stay for psychiatry.  On independent chart review, he was admitted for alcohol withdrawal in the end of July.  He presented that time with seizures or possible pseudoseizures, was loaded phenobarbital , ethanol at presentation was more than 200.     Physical Exam   Triage Vital Signs: ED Triage Vitals  Encounter Vitals Group     BP 10/10/23 2146 (!) 139/96     Girls Systolic BP Percentile --      Girls Diastolic BP Percentile --      Boys Systolic BP Percentile --      Boys Diastolic BP Percentile --      Pulse Rate 10/10/23 2146 (!) 115     Resp 10/10/23 2146 16     Temp 10/10/23 2146 98.4 F (36.9 C)     Temp src --      SpO2 10/10/23 2146 94 %     Weight 10/10/23 2145 162 lb 4.1 oz (73.6 kg)     Height 10/10/23 2145 5' 10 (1.778 m)     Head Circumference --      Peak Flow --      Pain Score 10/10/23 2145 0     Pain Loc --      Pain Education --      Exclude from Growth Chart --     Most recent vital signs: Vitals:   10/10/23 2146  BP: (!) 139/96  Pulse: (!) 115  Resp: 16  Temp: 98.4 F (36.9 C)  SpO2: 94%     General: Awake, no distress.  CV:  Good peripheral perfusion.  Resp:  Normal effort.  Abd:  No distention.  Soft nontender Other:  Calm, no tongue fasciculations or tremors   ED Results / Procedures / Treatments   Labs (all labs ordered are listed, but only abnormal results are  displayed) Labs Reviewed  CBC WITH DIFFERENTIAL/PLATELET - Abnormal; Notable for the following components:      Result Value   Hemoglobin 17.2 (*)    Platelets 148 (*)    Basophils Absolute 0.2 (*)    All other components within normal limits  COMPREHENSIVE METABOLIC PANEL WITH GFR - Abnormal; Notable for the following components:   Potassium 3.4 (*)    Glucose, Bld 132 (*)    Total Protein 8.6 (*)    AST 241 (*)    ALT 55 (*)    Total Bilirubin 1.3 (*)    Anion gap 20 (*)    All other components within normal limits  ETHANOL - Abnormal; Notable for the following components:   Alcohol, Ethyl (B) 345 (*)    All other components within normal limits      PROCEDURES:  Critical Care performed: No  Procedures   MEDICATIONS ORDERED IN ED: Medications  LORazepam  (ATIVAN ) injection 0-4 mg ( Intravenous See Alternative 10/10/23 2221)    Or  LORazepam  (ATIVAN ) tablet 0-4 mg (2 mg Oral Given 10/10/23 2221)  LORazepam  (ATIVAN ) injection 0-4 mg (has no administration in time range)    Or  LORazepam  (ATIVAN ) tablet 0-4 mg (has no administration in time range)  thiamine  (VITAMIN B1) tablet 100 mg (has no administration in time range)    Or  thiamine  (VITAMIN B1) injection 100 mg (has no administration in time range)  ondansetron  (ZOFRAN ) injection 4 mg (4 mg Intravenous Given 10/10/23 2215)  sodium chloride  0.9 % bolus 1,000 mL (0 mLs Intravenous Stopped 10/10/23 2253)     IMPRESSION / MDM / ASSESSMENT AND PLAN / ED COURSE  I reviewed the triage vital signs and the nursing notes.                              Differential diagnosis includes, but is not limited to, alcohol intoxication, depression, suicidal statements, decompensated psych.  Also considered electrolyte derangements, early gastroenteritis.  No evidence of withdrawal at this time.  Will get labs, will given some IV fluids and Zofran  here.  Will put him on CIWA protocol.  Will keep involuntary since he is not actively  suicidal at this time.  Patient's presentation is most consistent with acute presentation with potential threat to life or bodily function.  Independent interpretation of labs and imaging below.  On reassessment patient is tolerating p.o. now.  Patient signed out pending ultrasound, if no acute concerning findings, able to be medically cleared for psych.    Clinical Course as of 10/10/23 2337  Tue Oct 10, 2023  2219 Independent review of labs, ethanol level is elevated, no leukocytosis, electrolytes not severely deranged, his LFTs are elevated but he has no jaundice or abdominal pain at this time, his LFTs have been elevated in the past, suspect this might be due to his alcohol use and hepatic steatosis history, he has no right upper quadrant ultrasound recently and his LFTs are slightly more elevated than before.  Will get a right upper quadrant ultrasound here. [TT]    Clinical Course User Index [TT] Waymond Lorelle Cummins, MD     FINAL CLINICAL IMPRESSION(S) / ED DIAGNOSES   Final diagnoses:  Alcohol abuse  Elevated LFTs  Nausea and vomiting, unspecified vomiting type     Rx / DC Orders   ED Discharge Orders     None        Note:  This document was prepared using Dragon voice recognition software and may include unintentional dictation errors.    Waymond Lorelle Cummins, MD 10/10/23 [phone]

## 2023-10-10 NOTE — ED Triage Notes (Addendum)
 Pt to ED via [facility] for alcohol intoxication, pt reports he has been vomiting since earlier today. Pt reports he has been drinking all day has hx of same. Pt drink a fifth a day.

## 2023-10-11 MED ORDER — ONDANSETRON 4 MG PO TBDP
4.0000 mg | ORAL_TABLET | Freq: Once | ORAL | Status: DC
  Filled 2023-10-11: qty 1

## 2023-10-11 MED ORDER — ONDANSETRON 4 MG PO TBDP
4.0000 mg | ORAL_TABLET | Freq: Once | ORAL | Status: AC
  Administered 2023-10-11 (×2): 4 mg via ORAL
  Filled 2023-10-11: qty 1

## 2023-10-11 MED ORDER — ONDANSETRON 4 MG PO TBDP
4.0000 mg | ORAL_TABLET | Freq: Once | ORAL | Status: AC
  Administered 2023-10-11 (×2): 4 mg via ORAL

## 2023-10-11 NOTE — ED Notes (Signed)
 Took pt to the bathroom, ambulated independently. Pt is back in bed at this time.

## 2023-10-11 NOTE — ED Notes (Signed)
 Patient to ultrasound at this time.

## 2023-10-11 NOTE — BH Assessment (Signed)
 Pt referral refaxed to: Destination  Service Provider Request Status Services Address Phone Fax Patient Preferred  [facility]  Pending - Request Sent -- [address] [place] [place] [address] [phone] --  [facility]  Pending - Request Sent -- [address], [place] [address] [phone] --  [facility]  Pending - Request Sent -- [address] [place] [address] [phone] --  [facility]  Pending - Request Sent -- [address] [place] [address] [phone] --  [facility]  Pending - Request Sent -- [address], [place] [address] [phone] --  [facility]  Pending - Request Sent -- [address] [place] [address] [phone] --  [facility]  Pending - Request Sent -- [address], [place] [address] [phone] --

## 2023-10-11 NOTE — ED Notes (Signed)
 Patient belongings include 1 shirt, 1 shorts, 2 black boots, 2 socks and 1 belt

## 2023-10-11 NOTE — Progress Notes (Signed)
 This provider was contacted by the ED to reassess the need for IVC for this patient.  Patient was evaluated last night and disposition was made for voluntary admission for stabilization.  Later in the day patient was requesting discharge but the documentation stated patient is high risk for suicide given his family members that includes his sister died by suicide.  Provider reached out to patient's wife who informed the team that patient has been drinking a lot and sending texts about wanting to kill himself and making it messy.  Family is afraid for patient's safety.  Patient lacks insight into his severe alcohol use and worsening depression and suicidal ideation.  Patient is a danger to self at this time IVC was initiated for inpatient psychiatric admission.

## 2023-10-11 NOTE — ED Notes (Signed)
 Date and time results received: 10/10/23 2215  Test: Alcohol Critical Value: 345  Name of Provider Notified: MD Waymond

## 2023-10-11 NOTE — ED Notes (Signed)
 Took patients IV out on his right hand.

## 2023-10-11 NOTE — ED Notes (Signed)
 IVC  CONSULT  DONE  PENDING  PLACEMENT

## 2023-10-11 NOTE — Group Note (Deleted)
 Date:  10/11/2023 Time:  2:22 PM  Group Topic/Focus:  Wellness Toolbox:   The focus of this group is to discuss various aspects of wellness, balancing those aspects and exploring ways to increase the ability to experience wellness.  Patients will create a wellness toolbox for use upon discharge.     Participation Level:  {BHH PARTICIPATION OZCZO:77735}  Participation Quality:  {BHH PARTICIPATION QUALITY:22265}  Affect:  {BHH AFFECT:22266}  Cognitive:  {BHH COGNITIVE:22267}  Insight: {BHH Insight2:20797}  Engagement in Group:  {BHH ENGAGEMENT IN HMNLE:77731}  Modes of Intervention:  {BHH MODES OF INTERVENTION:22269}  Additional Comments:  ***  Myra Curtistine BROCKS 10/11/2023, 2:22 PM

## 2023-10-11 NOTE — BH Assessment (Signed)
 Comprehensive Clinical Assessment (CCA) Note  10/11/2023 Bland Rudzinski [facility] [id number] Levi Mejia is a 53 year old, English speaking, white male. Pt presented to [facility] ED voluntarily. Per triage note: Pt to ED via [facility] for alcohol intoxication, pt reports he has been vomiting since earlier today. Pt reports he has been drinking all day has hx of same. Pt drink a fifth a day.  Patient presented for evaluation following concerns related to alcohol use. On assessment, the patient did not endorse suicidal ideation (SI), clarifying that previous statements made while intoxicated do not reflect current intent. Patient stated, "I'm done. I need help," indicating readiness for change. He reported daily consumption of approximately one-fifth of liquor and described experiencing withdrawal symptoms such as nausea, vomiting, and seizures when abstaining from alcohol. Objective: Patient demonstrated good insight into the impact of his alcohol use, acknowledging its interference with daily functioning and its role in straining familial relationships, particularly with his wife and daughter. He identified financial stress and unemployment as significant stressors. Patient was oriented to person, place, time, and situation (oriented x4). Thought processes were relevant and intact. Mood was euthymic with a responsive affect. Patient denied suicidal ideation (SI), homicidal ideation (HI), auditory or visual hallucinations (AV/H). Blood alcohol level (BAL) was 345 upon arrival; urine drug screen (UDS) is pending. Assessment: Patient appears to be in the Action Stage of change, expressing a clear desire to engage in substance abuse treatment and interest in pharmacological support to reduce alcohol cravings. Insight and motivation for recovery are present. No acute safety concerns at this time. Chief Complaint:  Chief Complaint  Patient presents with   Alcohol Intoxication   Visit Diagnosis: Alcohol use  disorder, severe, dependence (HCC)     CCA Screening, Triage and Referral (STR)  Patient Reported Information How did you hear about us ? No data recorded Referral name: No data recorded Referral phone number: No data recorded  Whom do you see for routine medical problems? No data recorded Practice/Facility Name: No data recorded Practice/Facility Phone Number: No data recorded Name of Contact: No data recorded Contact Number: No data recorded Contact Fax Number: No data recorded Prescriber Name: No data recorded Prescriber Address (if known): No data recorded  What Is the Reason for Your Visit/Call Today? No data recorded How Long Has This Been Causing You Problems? No data recorded What Do You Feel Would Help You the Most Today? No data recorded  Have You Recently Been in Any Inpatient Treatment (Hospital/Detox/Crisis Center/28-Day Program)? No data recorded Name/Location of Program/Hospital:No data recorded How Long Were You There? No data recorded When Were You Discharged? No data recorded  Have You Ever Received Services From [facility] Before? No data recorded Who Do You See at [facility]? No data recorded  Have You Recently Had Any Thoughts About Hurting Yourself? No data recorded Are You Planning to Commit Suicide/Harm Yourself At This time? No data recorded  Have you Recently Had Thoughts About Hurting Someone Sherral? No data recorded Explanation: No data recorded  Have You Used Any Alcohol or Drugs in the Past 24 Hours? No data recorded How Long Ago Did You Use Drugs or Alcohol? No data recorded What Did You Use and How Much? No data recorded  Do You Currently Have a Therapist/Psychiatrist? No data recorded Name of Therapist/Psychiatrist: No data recorded  Have You Been Recently Discharged From Any Office Practice or Programs? No data recorded Explanation of Discharge From Practice/Program: No data recorded    CCA Screening Triage Referral  Assessment Type of  Contact: No data recorded Is this Initial or Reassessment? No data recorded Date Telepsych consult ordered in [facility]:  No data recorded Time Telepsych consult ordered in [facility]:  No data recorded  Patient Reported Information Reviewed? No data recorded Patient Left Without Being Seen? No data recorded Reason for Not Completing Assessment: No data recorded  Collateral Involvement: No data recorded  Does Patient Have a Court Appointed Legal Guardian? No data recorded Name and Contact of Legal Guardian: No data recorded If Minor and Not Living with Parent(s), Who has Custody? No data recorded Is CPS involved or ever been involved? No data recorded Is APS involved or ever been involved? No data recorded  Patient Determined To Be At Risk for Harm To Self or Others Based on Review of Patient Reported Information or Presenting Complaint? No data recorded Method: No data recorded Availability of Means: No data recorded Intent: No data recorded Notification Required: No data recorded Additional Information for Danger to Others Potential: No data recorded Additional Comments for Danger to Others Potential: No data recorded Are There Guns or Other Weapons in Your Home? No data recorded Types of Guns/Weapons: No data recorded Are These Weapons Safely Secured?                            No data recorded Who Could Verify You Are Able To Have These Secured: No data recorded Do You Have any Outstanding Charges, Pending Court Dates, Parole/Probation? No data recorded Contacted To Inform of Risk of Harm To Self or Others: No data recorded  Location of Assessment: No data recorded  Does Patient Present under Involuntary Commitment? No data recorded IVC Papers Initial File Date: No data recorded  [place] of Residence: No data recorded  Patient Currently Receiving the Following Services: No data recorded  Determination of Need: No data recorded  Options For Referral: No data recorded    CCA  Biopsychosocial Intake/Chief Complaint:  No data recorded Current Symptoms/Problems: No data recorded  Patient Reported Schizophrenia/Schizoaffective Diagnosis in Past: No data recorded  Strengths: No data recorded Preferences: No data recorded Abilities: No data recorded  Type of Services Patient Feels are Needed: No data recorded  Initial Clinical Notes/Concerns: No data recorded  Mental Health Symptoms Depression:  No data recorded  Duration of Depressive symptoms: No data recorded  Mania:  No data recorded  Anxiety:   No data recorded  Psychosis:  No data recorded  Duration of Psychotic symptoms: No data recorded  Trauma:  No data recorded  Obsessions:  No data recorded  Compulsions:  No data recorded  Inattention:  No data recorded  Hyperactivity/Impulsivity:  No data recorded  Oppositional/Defiant Behaviors:  No data recorded  Emotional Irregularity:  No data recorded  Other Mood/Personality Symptoms:  No data recorded   Mental Status Exam Appearance and self-care  Stature:  No data recorded  Weight:  No data recorded  Clothing:  No data recorded  Grooming:  No data recorded  Cosmetic use:  No data recorded  Posture/gait:  No data recorded  Motor activity:  No data recorded  Sensorium  Attention:  No data recorded  Concentration:  No data recorded  Orientation:  No data recorded  Recall/memory:  No data recorded  Affect and Mood  Affect:  No data recorded  Mood:  No data recorded  Relating  Eye contact:  No data recorded  Facial expression:  No data recorded  Attitude  toward examiner:  No data recorded  Thought and Language  Speech flow: No data recorded  Thought content:  No data recorded  Preoccupation:  No data recorded  Hallucinations:  No data recorded  Organization:  No data recorded  [employer] of Knowledge:  No data recorded  Intelligence:  No data recorded  Abstraction:  No data recorded  Judgement:  No data recorded  Reality  Testing:  No data recorded  Insight:  No data recorded  Decision Making:  No data recorded  Social Functioning  Social Maturity:  No data recorded  Social Judgement:  No data recorded  Stress  Stressors:  No data recorded  Coping Ability:  No data recorded  Skill Deficits:  No data recorded  Supports:  No data recorded    Religion:    Leisure/Recreation:    Exercise/Diet:     CCA Employment/Education Employment/Work Situation:    Education:     CCA Family/Childhood History Family and Relationship History:    Childhood History:     Child/Adolescent Assessment:     CCA Substance Use Alcohol/Drug Use:                           ASAM's:  Six Dimensions of Multidimensional Assessment  Dimension 1:  Acute Intoxication and/or Withdrawal Potential:      Dimension 2:  Biomedical Conditions and Complications:      Dimension 3:  Emotional, Behavioral, or Cognitive Conditions and Complications:     Dimension 4:  Readiness to Change:     Dimension 5:  Relapse, Continued use, or Continued Problem Potential:     Dimension 6:  Recovery/Living Environment:     ASAM Severity Score:    ASAM Recommended Level of Treatment:     Substance use Disorder (SUD)    Recommendations for Services/Supports/Treatments:    DSM5 Diagnoses: Patient Active Problem List   Diagnosis Date Noted   Alcohol withdrawal seizure (HCC) 09/18/2023   Thrombocytopenia (HCC) 09/18/2023   Delirium tremens (HCC) 08/30/2023   Intractable back pain, acute on chronic 06/25/2023   Fall down 12 stairs 06/25/2023   Malnutrition of moderate degree 10/19/2022   Alcohol withdrawal with inpatient treatment (HCC) 10/17/2022   Scleral icterus 10/14/2022   Fatigue 10/14/2022   H/O alcohol dependence (HCC) 10/14/2022   Arthralgia of left hand 08/14/2022   Left hand pain 08/01/2022   Hypophosphatemia 07/30/2022   Weight loss, non-intentional 07/03/2022   Hyperlipidemia 07/03/2022    Screening PSA (prostate specific antigen) 07/03/2022   Colon cancer screening 07/03/2022   History of CVA (cerebrovascular accident) 07/03/2022   Nausea and vomiting 07/02/2022   Esophageal dysphagia 07/02/2022   Protein-calorie malnutrition, severe 07/01/2022   Alcohol withdrawal (HCC) 06/30/2022   Alcoholic hepatitis 06/29/2022   AKI (acute kidney injury) (HCC) 06/29/2022   Lactic acidosis 06/29/2022   Dehydration 06/29/2022   Stroke (HCC) 04/07/2022   Fall at home, initial encounter 04/07/2022   Hypokalemia 04/07/2022   Hyponatremia 04/07/2022   Abnormal LFTs 04/07/2022   HTN (hypertension) 04/07/2022   Hypomagnesemia 04/07/2022   Seizure (HCC) 05/01/2020   Sensory loss 02/05/2019   Alcohol use disorder, severe, dependence (HCC) 03/02/2018   Cerebrovascular disease 03/02/2018   Syncope, vasovagal 02/02/2018   Preventative health care 09/30/2016   Sleep disorder 09/30/2016   Essential hypertension, benign 08/23/2011    Patient Centered Plan: Patient is on the following Treatment Plan(s):  Substance Abuse Treatment/FCB.    Referrals to  Alternative Service(s): Kourtlyn Charlet R Chasady Longwell, LCAS

## 2023-10-11 NOTE — BH Assessment (Signed)
 Patient Faxed out to multiple facility per AC Danika   Destination  Service Provider Request Status Services Address Phone Fax Patient Preferred  [facility]  Pending - Request Sent -- [address] [place] [place] [address] [phone] --  [facility]  Pending - Request Sent -- [address] [place] [address] [phone] --  [facility]  Pending - Request Sent -- [address] [place] [address] [phone] --  [facility]  Pending - Request Sent -- [address] [place] [address] [phone] --  [facility]  Pending - Request Sent -- [address], [place] [address] [phone] --  [facility]  Pending - Request Sent -- [address] [place] [address] [phone] --  [facility]  Pending - Request Sent -- [address], [place] [address]

## 2023-10-11 NOTE — ED Provider Notes (Signed)
 Emergency Medicine Observation Re-evaluation Note  Levi Mejia is a 53 y.o. male, seen on rounds today.  Pt initially presented to the ED for complaints of Alcohol Intoxication Currently, the patient is resting.  Physical Exam  BP 105/68 (BP Location: Left Arm)   Pulse 94   Temp 98 F (36.7 C) (Oral)   Resp 16   Ht 5' 10 (1.778 m)   Wt 73.6 kg   SpO2 96%   BMI 23.28 kg/m   General: No distress   ED Course / MDM  EKG:   I have reviewed the labs performed to date as well as medications administered while in observation.  Recent changes in the last 24 hours include RUQ u/s without acute features. Medically cleared.  Plan  Current plan is for psych eval.    Claudene Rover, MD 10/11/23 [phone]

## 2023-10-11 NOTE — BH Assessment (Signed)
 TTS received phone call from patients wife [name] Grumbles who is looking for her husband. She reports he has been communicating suicidal thoughts and has been drinking heavily lately. She states she is in the [facility] of [facility] with getting him SU/MH treatment but this is a slow process that may take another month. She thinks he needs more help.

## 2023-10-11 NOTE — Consult Note (Signed)
 [facility] Psychiatric Consult Initial  Patient Name: .Levi Mejia [facility]  MRN: [id number]  DOB: 1970-05-01  Consult Order details:  Orders (From admission, onward)     Start     Ordered   10/10/23 2216  CONSULT TO CALL ACT TEAM       Ordering Provider: Waymond Lorelle Cummins, MD  Provider:  (Not yet assigned)  Question:  Reason for Consult?  Answer:  Psych consult   10/10/23 2215   10/10/23 2216  IP CONSULT TO PSYCHIATRY       Ordering Provider: Waymond Lorelle Cummins, MD  Provider:  (Not yet assigned)  Question Answer Comment  Consult Timeframe STAT - requires a response within one hour   STAT timeframe requires provider to provider communication, has the provider to provider communication been completed Yes   Reason for Consult? Consult for medication management   Contact phone number where the requesting provider can be reached [phone]      10/10/23 2215             Mode of Visit: Tele-visit Virtual Statement:TELE PSYCHIATRY ATTESTATION & CONSENT As the provider for this telehealth consult, I attest that I verified the patient's identity using two separate identifiers, introduced myself to the patient, provided my credentials, disclosed my location, and performed this encounter via a HIPAA-compliant, real-time, face-to-face, two-way, interactive audio and video platform and with the full consent and agreement of the patient (or guardian as applicable.) Patient physical location: [facility] . Telehealth provider physical location: home office in state of  .   Video start time:   Video end time:      Psychiatry Consult Evaluation  Service Date: October 11, 2023 LOS:  LOS: 0 days  Chief Complaint Intoxication Substance Abuse  Primary Psychiatric Diagnoses  Substance Induced mood disorder 2. Depression  Assessment  Levi Mejia is a 53 y.o. male admitted: Presented to the [facility] 10/10/2023  9:47 PM for nausea, vomiting, and evaluation after making  suicidal statements in the context of heavy alcohol use. He carries the psychiatric diagnoses of alcohol use disorder and unspecified depressive disorder, and has a past medical history of hypertension and prior alcohol withdrawal admission in July 2025, when he presented with seizures or possible pseudoseizures and had an ethanol level >200.  His current presentation of acute alcohol intoxication with associated suicidal statements is most consistent with alcohol-induced mood disorder in the setting of chronic alcohol use disorder. He meets criteria for alcohol use disorder, severe, based on recurrent heavy drinking, withdrawal symptoms, prior admissions, and functional impairment.   Current outpatient psychotropic medications are not reported, thus his response to psychiatric medications is unknown. On initial examination, patient is alert, cooperative, and medically stable but at high risk for withdrawal.  Diagnoses:  Active Hospital problems: Active Problems:   * No active hospital problems. *    Plan   ## Psychiatric Medication Recommendations:  Detox protocol   ## Medical Decision Making Capacity: Not specifically addressed in this encounter   ## Disposition:-- We recommend inpatient psychiatric hospitalization when medically cleared. Patient is under voluntary admission status at this time; please IVC if attempts to leave hospital.  ## Behavioral / Environmental: - No specific recommendations at this time.     ## Safety and Observation Level:  - Based on my clinical evaluation, I estimate the patient to be at low risk of self harm in the current setting. - At this time, we recommend  routine. This decision is based  on my review of the chart including patient's history and current presentation, interview of the patient, mental status examination, and consideration of suicide risk including evaluating suicidal ideation, plan, intent, suicidal or self-harm behaviors, risk factors, and  protective factors. This judgment is based on our ability to directly address suicide risk, implement suicide prevention strategies, and develop a safety plan while the patient is in the clinical setting. Please contact our team if there is a concern that risk level has changed.  CSSR Risk Category:C-SSRS RISK CATEGORY: No Risk  Suicide Risk Assessment: Patient has following modifiable risk factors for suicide: untreated depression, which we are addressing by inpatient admission. Patient has following non-modifiable or demographic risk factors for suicide: male gender Patient has the following protective factors against suicide: Supportive family  Thank you for this consult request. Recommendations have been communicated to the primary team.  We will recommend inpatient admission at this time.   [name], NP       History of Present Illness  Relevant Aspects of Hospital ED Course:  Admitted on 10/10/2023 for alcohol use disorder.   Patient Report:  Levi Mejia is a 53 year old male with a history of hypertension and alcohol use disorder presenting to the ED with nausea and vomiting since earlier today. He reports drinking alcohol all day. Patient states he was brought in after calling a therapist and making suicidal statements earlier, including that he "wanted to end it." He currently denies suicidal ideation (SI), homicidal ideation (HI), or auditory/visual hallucinations (AVH). He reports a significant family history of suicide and alcohol-related death his sister died by suicide, and his father died from chronic alcohol use. He expresses motivation to seek help for his alcohol use disorder and is agreeable to facility-based crisis [facility]) admission for treatment.  Psych ROS:  Depression: yes Anxiety:  yes Mania (lifetime and current): no Psychosis: (lifetime and current): no   Review of Systems  Constitutional: Negative.   HENT: Negative.    Eyes: Negative.    Respiratory: Negative.    Cardiovascular: Negative.   Gastrointestinal: Negative.   Genitourinary: Negative.   Musculoskeletal: Negative.   Skin: Negative.   Neurological: Negative.   Psychiatric/Behavioral:  Positive for depression and substance abuse.      Psychiatric and Social History  Psychiatric History:  Information collected from Patient  Prev Dx/Sx: Etoh Abuse Current Psych Provider: denies Home Meds (current): denies Previous Med Trials: denies Therapy: denies  Prior Psych Hospitalization: denies  Prior Self Harm: denies Prior Violence: denies  Family Psych History: sister committed suicide, and father died of ETOH abuse related illness Family Hx suicide: sister  Social History:  Developmental Hx: unknown Educational Hx: unknown Occupational Hx: unknown Legal Hx: unknown Living Situation: with wife Spiritual Hx: Not religious Access to weapons/lethal means: no   Substance History Alcohol: yes  Type of alcohol liquor Last Drink yesterday Number of drinks per day a 5th History of alcohol withdrawal seizures unknown History of DT's unknown Tobacco: denies Illicit drugs: denies Prescription drug abuse: denies Rehab hx: yes 1 prior  Exam Findings   Vital Signs:  Temp:  [98 F (36.7 C)-98.4 F (36.9 C)] 98 F (36.7 C) (08/13 0326) Pulse Rate:  [94-115] 94 (08/13 0435) Resp:  [14-16] 16 (08/13 0435) BP: (105-139)/(68-96) 105/68 (08/13 0435) SpO2:  [94 %-98 %] 96 % (08/13 0435) Weight:  [73.6 kg] 73.6 kg (08/12 2145) Blood pressure 105/68, pulse 94, temperature 98 F (36.7 C), temperature source Oral, resp. rate 16, height  5' 10 (1.778 m), weight 73.6 kg, SpO2 96%. Body mass index is 23.28 kg/m.  Physical Exam HENT:     Head: Normocephalic.     Nose: Nose normal.     Mouth/Throat:     Pharynx: Oropharynx is clear.  Pulmonary:     Effort: Pulmonary effort is normal.  Musculoskeletal:        General: Normal range of motion.     Cervical  back: Normal range of motion.  Skin:    General: Skin is dry.  Neurological:     Mental Status: He is alert.        Other History   These have been pulled in through the EMR, reviewed, and updated if appropriate.  Family History:  The patient's family history includes Alcohol abuse in his father; Cancer in his mother; Coronary artery disease (age of onset: 90) in his mother; Heart attack (age of onset: 27) in his father; Heart failure in his father; Hypertension in his brother, father, and mother.  Medical History: Past Medical History:  Diagnosis Date  . Alcohol abuse   . Attention deficit disorder without mention of hyperactivity   . HTN (hypertension)   . Hyperlipidemia 07/03/2022  . ICH (intracerebral hemorrhage) (HCC)   . Seizures (HCC)   . Stroke [facility]     Surgical History: Past Surgical History:  Procedure Laterality Date  . ESOPHAGOGASTRODUODENOSCOPY (EGD) WITH PROPOFOL  N/A 07/04/2022   Procedure: ESOPHAGOGASTRODUODENOSCOPY (EGD) WITH PROPOFOL ;  Surgeon: Unk Corinn Skiff, MD;  Location: [facility] ENDOSCOPY;  Service: Endoscopy;  Laterality: N/A;  . Hand tendon reattachment  02/28/1990   Right hand  . toe reattachment  02/28/1994   Right 1st toe     Medications:   Current Facility-Administered Medications:  .  LORazepam  (ATIVAN ) injection 0-4 mg, 0-4 mg, Intravenous, Q6H **OR** LORazepam  (ATIVAN ) tablet 0-4 mg, 0-4 mg, Oral, Q6H, Tan, Lorelle Cummins, MD, 2 mg at 10/11/23 0334 .  [START ON 10/13/2023] LORazepam  (ATIVAN ) injection 0-4 mg, 0-4 mg, Intravenous, Q12H **OR** [START ON 10/13/2023] LORazepam  (ATIVAN ) tablet 0-4 mg, 0-4 mg, Oral, Q12H, Tan, Lorelle Cummins, MD .  thiamine  (VITAMIN B1) tablet 100 mg, 100 mg, Oral, Daily **OR** thiamine  (VITAMIN B1) injection 100 mg, 100 mg, Intravenous, Daily, Tan, Lorelle Cummins, MD  Current Outpatient Medications:  .  aspirin  EC 81 MG tablet, Take 1 tablet (81 mg total) by mouth daily. Swallow whole., Disp: 30 tablet, Rfl: 0 .  folic acid   (FOLVITE ) 1 MG tablet, Take 1 tablet (1 mg total) by mouth daily., Disp: 30 tablet, Rfl: 0 .  Multiple Vitamin (MULTIVITAMIN WITH MINERALS) TABS tablet, Take 1 tablet by mouth daily., Disp: 30 tablet, Rfl: 0 .  thiamine  (VITAMIN B1) 100 MG tablet, Take 1 tablet (100 mg total) by mouth daily., Disp: 30 tablet, Rfl: 0  Allergies: No Known Allergies  Menna Abeln, NP

## 2023-10-11 NOTE — ED Notes (Signed)
 Patient given bedtime snack at this time.

## 2023-10-12 ENCOUNTER — Inpatient Hospital Stay
Admission: AD | Admit: 2023-10-12 | Discharge: 2023-10-17 | DRG: 885 | Disposition: A | Discharge status: Home / Self Care | Payer: 59 | Source: Intra-hospital | Attending: Psychiatry | Admitting: Psychiatry

## 2023-10-12 ENCOUNTER — Other Ambulatory Visit: Payer: Self-pay

## 2023-10-12 DIAGNOSIS — I10 Essential (primary) hypertension: Secondary | ICD-10-CM | POA: Diagnosis present

## 2023-10-12 DIAGNOSIS — F332 Major depressive disorder, recurrent severe without psychotic features: Principal | ICD-10-CM | POA: Diagnosis present

## 2023-10-12 DIAGNOSIS — F419 Anxiety disorder, unspecified: Secondary | ICD-10-CM | POA: Diagnosis present

## 2023-10-12 DIAGNOSIS — Z7982 Long term (current) use of aspirin: Secondary | ICD-10-CM | POA: Diagnosis not present

## 2023-10-12 DIAGNOSIS — R45851 Suicidal ideations: Secondary | ICD-10-CM | POA: Diagnosis present

## 2023-10-12 DIAGNOSIS — Z560 Unemployment, unspecified: Secondary | ICD-10-CM

## 2023-10-12 DIAGNOSIS — Z818 Family history of other mental and behavioral disorders: Secondary | ICD-10-CM | POA: Diagnosis not present

## 2023-10-12 DIAGNOSIS — Z811 Family history of alcohol abuse and dependence: Secondary | ICD-10-CM

## 2023-10-12 DIAGNOSIS — Z8249 Family history of ischemic heart disease and other diseases of the circulatory system: Secondary | ICD-10-CM

## 2023-10-12 DIAGNOSIS — Z8673 Personal history of transient ischemic attack (TIA), and cerebral infarction without residual deficits: Secondary | ICD-10-CM

## 2023-10-12 DIAGNOSIS — E785 Hyperlipidemia, unspecified: Secondary | ICD-10-CM | POA: Diagnosis present

## 2023-10-12 LAB — URINE DRUG SCREEN, QUALITATIVE (ARMC ONLY)
Amphetamines, Ur Screen: NOT DETECTED
Barbiturates, Ur Screen: POSITIVE — AB
Benzodiazepine, Ur Scrn: POSITIVE — AB
Cannabinoid 50 Ng, Ur ~~LOC~~: NOT DETECTED
Cocaine Metabolite,Ur ~~LOC~~: NOT DETECTED
MDMA (Ecstasy)Ur Screen: NOT DETECTED
Methadone Scn, Ur: NOT DETECTED
Opiate, Ur Screen: NOT DETECTED
Phencyclidine (PCP) Ur S: NOT DETECTED
Tricyclic, Ur Screen: NOT DETECTED

## 2023-10-12 MED ORDER — HALOPERIDOL LACTATE 5 MG/ML IJ SOLN: 10.0000 mg | Freq: Three times a day (TID) | INTRAMUSCULAR | Status: DC | PRN

## 2023-10-12 MED ORDER — ONDANSETRON 4 MG PO TBDP: 4.0000 mg | ORAL_TABLET | Freq: Four times a day (QID) | ORAL | Status: AC | PRN

## 2023-10-12 MED ORDER — ADULT MULTIVITAMIN W/MINERALS CH
1.0000 | ORAL_TABLET | Freq: Every day | ORAL | Status: DC
  Administered 2023-10-13 – 2023-10-17 (×3): 1 via ORAL
  Filled 2023-10-12 (×5): qty 1

## 2023-10-12 MED ORDER — FOLIC ACID 1 MG PO TABS
1.0000 mg | ORAL_TABLET | Freq: Every day | ORAL | Status: DC
  Administered 2023-10-13 – 2023-10-17 (×4): 1 mg via ORAL
  Filled 2023-10-12 (×4): qty 1

## 2023-10-12 MED ORDER — DIPHENHYDRAMINE HCL 50 MG/ML IJ SOLN
50.0000 mg | Freq: Three times a day (TID) | INTRAMUSCULAR | Status: DC | PRN
Start: 2023-10-12 — End: 2023-10-17

## 2023-10-12 MED ORDER — ONDANSETRON 4 MG PO TBDP
4.0000 mg | ORAL_TABLET | Freq: Once | ORAL | Status: AC
  Administered 2023-10-12: 4 mg via ORAL
  Filled 2023-10-12: qty 1

## 2023-10-12 MED ORDER — LORAZEPAM 2 MG/ML IJ SOLN
2.0000 mg | Freq: Three times a day (TID) | INTRAMUSCULAR | Status: DC | PRN
Start: 2023-10-12 — End: 2023-10-17

## 2023-10-12 MED ORDER — LORAZEPAM 2 MG/ML IJ SOLN
2.0000 mg | Freq: Three times a day (TID) | INTRAMUSCULAR | Status: DC | PRN
  Filled 2023-10-12: qty 1

## 2023-10-12 MED ORDER — HYDROXYZINE HCL 25 MG PO TABS
25.0000 mg | ORAL_TABLET | Freq: Four times a day (QID) | ORAL | Status: DC | PRN
  Administered 2023-10-15: 25 mg via ORAL
  Filled 2023-10-12: qty 1

## 2023-10-12 MED ORDER — ALUM & MAG HYDROXIDE-SIMETH 200-200-20 MG/5ML PO SUSP
30.0000 mL | ORAL | Status: DC | PRN
Start: 2023-10-12 — End: 2023-10-17

## 2023-10-12 MED ORDER — MAGNESIUM HYDROXIDE 400 MG/5ML PO SUSP: 30.0000 mL | Freq: Every day | ORAL | Status: DC | PRN

## 2023-10-12 MED ORDER — CHLORDIAZEPOXIDE HCL 25 MG PO CAPS
25.0000 mg | ORAL_CAPSULE | Freq: Three times a day (TID) | ORAL | Status: AC
  Filled 2023-10-12: qty 1

## 2023-10-12 MED ORDER — CHLORDIAZEPOXIDE HCL 25 MG PO CAPS: 25.0000 mg | ORAL_CAPSULE | Freq: Four times a day (QID) | ORAL | Status: DC | PRN

## 2023-10-12 MED ORDER — TRAZODONE HCL 50 MG PO TABS
50.0000 mg | ORAL_TABLET | Freq: Every evening | ORAL | Status: DC | PRN
Start: 2023-10-12 — End: 2023-10-17
  Administered 2023-10-12 – 2023-10-14 (×2): 50 mg via ORAL
  Filled 2023-10-12 (×2): qty 1

## 2023-10-12 MED ORDER — CHLORDIAZEPOXIDE HCL 25 MG PO CAPS
25.0000 mg | ORAL_CAPSULE | Freq: Every day | ORAL | Status: AC
  Administered 2023-10-16: 25 mg via ORAL
  Filled 2023-10-12: qty 1

## 2023-10-12 MED ORDER — THIAMINE HCL 100 MG PO TABS
100.0000 mg | ORAL_TABLET | Freq: Every day | ORAL | Status: DC
  Administered 2023-10-13 – 2023-10-17 (×4): 100 mg via ORAL
  Filled 2023-10-12 (×9): qty 1

## 2023-10-12 MED ORDER — ACETAMINOPHEN 325 MG PO TABS: 650.0000 mg | ORAL_TABLET | Freq: Four times a day (QID) | ORAL | Status: DC | PRN

## 2023-10-12 MED ORDER — CHLORDIAZEPOXIDE HCL 25 MG PO CAPS
25.0000 mg | ORAL_CAPSULE | Freq: Four times a day (QID) | ORAL | Status: AC
  Administered 2023-10-12 – 2023-10-13 (×3): 25 mg via ORAL
  Filled 2023-10-12 (×4): qty 1

## 2023-10-12 MED ORDER — LOPERAMIDE HCL 2 MG PO CAPS: 2.0000 mg | ORAL_CAPSULE | ORAL | Status: AC | PRN

## 2023-10-12 MED ORDER — DIPHENHYDRAMINE HCL 25 MG PO CAPS
50.0000 mg | ORAL_CAPSULE | Freq: Three times a day (TID) | ORAL | Status: DC | PRN
Start: 2023-10-12 — End: 2023-10-17

## 2023-10-12 MED ORDER — DIPHENHYDRAMINE HCL 50 MG/ML IJ SOLN: 50.0000 mg | Freq: Three times a day (TID) | INTRAMUSCULAR | Status: DC | PRN

## 2023-10-12 MED ORDER — CHLORDIAZEPOXIDE HCL 25 MG PO CAPS
25.0000 mg | ORAL_CAPSULE | ORAL | Status: AC
  Administered 2023-10-15 (×2): 25 mg via ORAL
  Filled 2023-10-12 (×2): qty 1

## 2023-10-12 MED ORDER — HALOPERIDOL 5 MG PO TABS: 5.0000 mg | ORAL_TABLET | Freq: Three times a day (TID) | ORAL | Status: DC | PRN

## 2023-10-12 MED ORDER — HALOPERIDOL LACTATE 5 MG/ML IJ SOLN: 5.0000 mg | Freq: Three times a day (TID) | INTRAMUSCULAR | Status: DC | PRN

## 2023-10-12 MED ORDER — ASPIRIN 81 MG PO TBEC
81.0000 mg | DELAYED_RELEASE_TABLET | Freq: Every day | ORAL | Status: DC
  Administered 2023-10-13 – 2023-10-17 (×4): 81 mg via ORAL
  Filled 2023-10-12 (×5): qty 1

## 2023-10-12 NOTE — Tx Team (Signed)
 Initial Treatment Plan 10/12/2023 6:57 PM [name] [facility] FMW:[id number]    PATIENT STRESSORS: Financial difficulties   Occupational concerns   Substance abuse     PATIENT STRENGTHS: Active sense of humor  Supportive family/friends    PATIENT IDENTIFIED PROBLEMS: Alcohol detox  Lack of vocation                   DISCHARGE CRITERIA:  Adequate post-discharge living arrangements Improved stabilization in mood, thinking, and/or behavior Verbal commitment to aftercare and medication compliance Withdrawal symptoms are absent or subacute and managed without 24-hour nursing intervention  PRELIMINARY DISCHARGE PLAN: Attend PHP/IOP Return to previous living arrangement  PATIENT/FAMILY INVOLVEMENT: This treatment plan has been presented to and reviewed with the patient, Levi Mejia.  The patient and family have been given the opportunity to ask questions and make suggestions.  Modelle Vollmer, RN 10/12/2023, 6:57 PM

## 2023-10-12 NOTE — Plan of Care (Signed)
New admission  Problem: Education: Goal: Knowledge of [place] [facility] Education information/materials will improve Outcome: Not Progressing Goal: Emotional status will improve Outcome: Not Progressing Goal: Mental status will improve Outcome: Not Progressing Goal: Verbalization of understanding the information provided will improve Outcome: Not Progressing   Problem: Activity: Goal: Interest or engagement in activities will improve Outcome: Not Progressing Goal: Sleeping patterns will improve Outcome: Not Progressing   Problem: Coping: Goal: Ability to verbalize frustrations and anger appropriately will improve Outcome: Not Progressing Goal: Ability to demonstrate self-control will improve Outcome: Not Progressing   Problem: Health Behavior/Discharge Planning: Goal: Identification of resources available to assist in meeting health care needs will improve Outcome: Not Progressing Goal: Compliance with treatment plan for underlying cause of condition will improve Outcome: Not Progressing   Problem: Physical Regulation: Goal: Ability to maintain clinical measurements within normal limits will improve Outcome: Not Progressing   Problem: Safety: Goal: Periods of time without injury will increase Outcome: Not Progressing

## 2023-10-12 NOTE — Progress Notes (Signed)
 Levi Mejia is a 80M who presented initially for alcohol detox. He reports that he called the emergency crisis number and stated that he just wanted to end it all and was then brought to the ER for detox by police officers. Upon presenting to the unit, became frustrated that he was IVC'd, and did not appear to understand that suicidal statements are a cause for concern, especially because these statements were corroborated by family. States that his goal is to enter a long-term rehab with the [facility]. 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 Denies SI at this time, and says that his prior statements were made as he was just blowing off steam. States that he has been drinking for 25 years, and drinks roughly half of a fifth per day of hard liquor. Hx of seizures r/t alcohol detox. Currently on librium  taper, reports that he takes no other medications.

## 2023-10-12 NOTE — ED Notes (Signed)
Patient given snack at this time. Patient denies any other needs.

## 2023-10-12 NOTE — Group Note (Signed)
 Recreation Therapy Group Note   Group Topic:Healthy Support Systems  Group Date: 10/12/2023 Start Time: 1530 End Time: 1615 Facilitators: Celestia Jeoffrey FORBES ARTICE, CTRS Location: Craft Room  Group Description: Straw Bridge. In groups or individually, patients were given 10 plastic drinking straws and an equal length of masking tape. Using the materials provided, patients were instructed to build a free-standing bridge-like structure to suspend an everyday item (ex: deck of cards) off the floor or table surface. All materials were required to be used in Secondary school teacher. LRT facilitated post-activity discussion reviewing the importance of having strong and healthy support systems in our lives. LRT discussed how the people in our lives serve as the tape and the deck of cards we placed on top of our straw structure are the stressors we face in daily life. LRT and pts discussed what happens in our life when things get too heavy for us , and we don't have strong supports outside of the hospital. Pt shared 2 of their healthy supports in their life aloud in the group.   Goal Area(s) Addressed:  Patient will identify 2 healthy supports in their life. Patient will identify skills to successfully complete activity. Patient will identify correlation of this activity to life post-discharge.  Patient will build on frustration tolerance skills. Patient will increase team building and communication skills.   Affect/Mood: N/A   Participation Level: Did not attend    Clinical Observations/Individualized Feedback: Patient did not attend group.   Plan: Continue to engage patient in RT group sessions 2-3x/week.   Jeoffrey FORBES Celestia, LRT, CTRS 10/12/2023 5:51 PM

## 2023-10-12 NOTE — ED Notes (Signed)
 Pt Breakfast provided at bedside

## 2023-10-12 NOTE — ED Notes (Signed)
 CIWA charted at 11:58 is for 10am- this RN unable to change time to update documentation in epic.

## 2023-10-12 NOTE — ED Notes (Signed)
Patient given a sprite at this time. 

## 2023-10-12 NOTE — Group Note (Signed)
 Date:  10/12/2023 Time:  9:01 PM  Group Topic/Focus:  Personal Choices and Values:   The focus of this group is to help patients assess and explore the importance of values in their lives, how their values affect their decisions, how they express their values and what opposes their expression. Self Esteem Action Plan:   The focus of this group is to help patients create a plan to continue to build self-esteem after discharge.    Participation Level:  Active  Participation Quality:  Appropriate  Affect:  Appropriate  Cognitive:  Appropriate  Insight: Appropriate  Engagement in Group:  Engaged  Modes of Intervention:  Discussion  Additional Comments:    Jasmeet Manton L 10/12/2023, 9:01 PM

## 2023-10-12 NOTE — ED Notes (Addendum)
 Patient admitted to behavioral health unit. Patient aware of admission. Patient transferred with security and tech. All belongings sent with patient.

## 2023-10-13 DIAGNOSIS — F332 Major depressive disorder, recurrent severe without psychotic features: Secondary | ICD-10-CM | POA: Diagnosis not present

## 2023-10-13 MED ORDER — SERTRALINE HCL 25 MG PO TABS
25.0000 mg | ORAL_TABLET | Freq: Every day | ORAL | Status: DC
  Administered 2023-10-13 – 2023-10-17 (×4): 25 mg via ORAL
  Filled 2023-10-13 (×4): qty 1

## 2023-10-13 NOTE — Group Note (Signed)
 Date:  10/13/2023 Time:  10:41 AM  Group Topic/Focus:  Goals Group:   The focus of this group is to help patients establish daily goals to achieve during treatment and discuss how the patient can incorporate goal setting into their daily lives to aide in recovery.    Participation Level:  Did Not Attend   Deitra Clap [facility] 10/13/2023, 10:41 AM

## 2023-10-13 NOTE — [facility] Suicide Risk Assessment (Signed)
 [facility] INPATIENT:  Family/Significant Other Suicide Prevention Education  Suicide Prevention Education:  Patient Refusal for Family/Significant Other Suicide Prevention Education: The patient Elmon Shader has refused to provide written consent for family/significant other to be provided Family/Significant Other Suicide Prevention Education during admission and/or prior to discharge.  Physician notified.  SPE completed with pt, as pt refused to consent to family contact. SPI pamphlet provided to pt and pt was encouraged to share information with support network, ask questions, and talk about any concerns relating to SPE. Pt denies access to guns/firearms and verbalized understanding of information provided. Mobile Crisis information also provided to pt.  Nadara JONELLE Fam 10/13/2023, 2:16 PM

## 2023-10-13 NOTE — H&P (Signed)
 Psychiatric Admission Assessment Adult  Patient Identification: Levi Mejia MRN:  [id number] Date of Evaluation:  10/13/2023 Chief Complaint:  MDD (major depressive disorder), recurrent severe, without psychosis (HCC) [F33.2]   History of Present Illness: Levi Mejia is a 53 y.o. male admitted: Presented to the [facility] 10/10/2023  9:47 PM for nausea, vomiting, and evaluation after making suicidal statements in the context of heavy alcohol use. He carries the psychiatric diagnoses of alcohol use disorder and unspecified depressive disorder, and has a past medical history of hypertension and prior alcohol withdrawal admission in July 2025, when he presented with seizures or possible pseudoseizures and had an ethanol level >200. Patient is admitted to adult psych unit with Q15 min safety monitoring. Multidisciplinary team approach is offered. Medication management; group/milieu therapy is offered.   Patient is noted to be resting in bed.  He was very minimizing and superficially engaged in the interview.  He reports living with his wife and kids of age 70, 75.  He reports having history of depression and reports having depression for the last few months.  He reports being unemployed for the last 3 to 4 months and when asked for the reason he initially kept quiet but did acknowledge that because of his addiction to alcohol and multiple absences to work he lost his job.  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  He did acknowledge going through financial burden because of unemployment.  He reports poor appetite and sleep.  He reports low energy and poor motivation.  He denies current SI/HI/intent/plan but did acknowledge that he did send the text messages about wanting to kill himself and end it all to his wife.  He reports that his wife was at home with his and does text messages.  When asked for the reason for the suicidal thoughts or the duration of suicidal thoughts patient  remains silent and reports I do not know .  Provider tried multiple interventions to give him space to talk about his suicidal thoughts but patient would not engage but clearly he became tearful during the discussion and kept saying I do not know .  He denies auditory/visual hallucinations, denies any anxiety or panic attacks, denies current or previous episodes of mania/hypomania, denies nightmares or flashbacks.  He did acknowledge problems with alcohol for many years and he reports drinking vodka half to 2 fifth bottle on daily basis with history of withdrawal seizures but denies any DTs.  Total Time spent with patient: 1 hour Sleep  Sleep:Sleep: Fair  Past Psychiatric History:  Psychiatric History:  Information collected from patient/chart  Prev Dx/Sx: Etoh Abuse Current Psych Provider: denies Home Meds (current): denies Previous Med Trials: denies Therapy: denies   Prior Psych Hospitalization: denies  Prior Self Harm: denies Prior Violence: denies   Family Psych History: sister committed suicide, and father died of ETOH abuse related illness Family Hx suicide: sister   Social History:  Educational Hx: unknown Occupational Hx: Unemployed Legal Hx: Denies Living Situation: with wife Spiritual Hx: Not religious Access to weapons/lethal means: Wife removed the guns from the house   Substance History Alcohol: yes  Type of alcohol liquor Last Drink yesterday Number of drinks per day a 5th History of alcohol withdrawal seizures yes in the past History of DT's denies Tobacco: denies Illicit drugs: denies Prescription drug abuse: denies Rehab hx: yes 1 prior Is the patient at risk to self? No.  Has the patient been a risk to self in  the past 6 months? Yes.    Has the patient been a risk to self within the distant past? No.  Is the patient a risk to others? No.  Has the patient been a risk to others in the past 6 months? No.  Has the patient been a risk to others within  the distant past? No.   [place] Scale:  Flowsheet Row Admission (Current) from 10/12/2023 in [facility] INPATIENT BEH[facility] MEDICINE ED from 10/10/2023 in [facility] Emergency Department at [facility] ED to Hosp-Admission (Discharged) from 09/18/2023 in [facility] ICU/CCU  C-SSRS RISK CATEGORY Moderate Risk No Risk No Risk     Past Medical History:  Past Medical History:  Diagnosis Date   Alcohol abuse    Attention deficit disorder without mention of hyperactivity    HTN (hypertension)    Hyperlipidemia 07/03/2022   ICH (intracerebral hemorrhage) (HCC)    Seizures (HCC)    Stroke [facility])     Past Surgical History:  Procedure Laterality Date   ESOPHAGOGASTRODUODENOSCOPY (EGD) WITH PROPOFOL  N/A 07/04/2022   Procedure: ESOPHAGOGASTRODUODENOSCOPY (EGD) WITH PROPOFOL ;  Surgeon: Unk Corinn Skiff, MD;  Location: [facility] ENDOSCOPY;  Service: Endoscopy;  Laterality: N/A;   Hand tendon reattachment  02/28/1990   Right hand   toe reattachment  02/28/1994   Right 1st toe   Family History:  Family History  Problem Relation Age of Onset   Coronary artery disease Mother 75       Stent   Cancer Mother        Bone   Hypertension Mother    Heart failure Father    Heart attack Father 63   Hypertension Father    Alcohol abuse Father    Hypertension Brother    Diabetes Neg Hx    Prostate cancer Neg Hx    Colon cancer Neg Hx    Heart disease Neg Hx     Social History:  Social History   Substance and Sexual Activity  Alcohol Use Yes   Comment: 1/5 liquor daily     Social History   Substance and Sexual Activity  Drug Use Not Currently   Types: Marijuana      Allergies:  No Known Allergies Lab Results:  Results for orders placed or performed during the hospital encounter of 10/10/23 (from the past 48 hours)  Urine Drug Screen, Qualitative ([facility] only)     Status: Abnormal   Collection Time: 10/12/23 10:59 AM  Result Value Ref Range   Tricyclic, Ur Screen  NONE DETECTED NONE DETECTED   Amphetamines, Ur Screen NONE DETECTED NONE DETECTED   MDMA (Ecstasy)Ur Screen NONE DETECTED NONE DETECTED   Cocaine Metabolite,Ur [place] NONE DETECTED NONE DETECTED   Opiate, Ur Screen NONE DETECTED NONE DETECTED   Phencyclidine (PCP) Ur S NONE DETECTED NONE DETECTED   Cannabinoid 50 Ng, Ur [facility] NONE DETECTED NONE DETECTED   Barbiturates, Ur Screen POSITIVE (A) NONE DETECTED   Benzodiazepine, Ur Scrn POSITIVE (A) NONE DETECTED   Methadone Scn, Ur NONE DETECTED NONE DETECTED    Comment: (NOTE) Tricyclics + metabolites, urine    Cutoff 1000 ng/mL Amphetamines + metabolites, urine  Cutoff 1000 ng/mL MDMA (Ecstasy), urine              Cutoff 500 ng/mL Cocaine Metabolite, urine          Cutoff 300 ng/mL Opiate + metabolites, urine        Cutoff 300 ng/mL Phencyclidine (PCP), urine  Cutoff 25 ng/mL Cannabinoid, urine                 Cutoff 50 ng/mL Barbiturates + metabolites, urine  Cutoff 200 ng/mL Benzodiazepine, urine              Cutoff 200 ng/mL Methadone, urine                   Cutoff 300 ng/mL  The urine drug screen provides only a preliminary, unconfirmed analytical test result and should not be used for non-medical purposes. Clinical consideration and professional judgment should be applied to any positive drug screen result due to possible interfering substances. A more specific alternate chemical method must be used in order to obtain a confirmed analytical result. Gas chromatography / mass spectrometry (GC/MS) is the preferred confirm atory method. Performed at [facility], [address] [place], [place] [address]     Blood Alcohol level:  Lab Results  Component Value Date   ETH 345 [facility]) 10/10/2023   ETH 204 (H) 09/18/2023    Metabolic Disorder Labs:  Lab Results  Component Value Date   HGBA1C 5.1 06/23/2022   MPG 85.32 04/07/2022   Lab Results  Component Value Date   PROLACTIN 10.5 09/18/2023   Lab Results   Component Value Date   CHOL 228 (H) 06/23/2022   TRIG 74.0 06/23/2022   HDL 95.50 06/23/2022   CHOLHDL 2 06/23/2022   VLDL 14.8 06/23/2022   LDLCALC 118 (H) 06/23/2022   LDLCALC 123 (H) 04/08/2022    Current Medications: Current Facility-Administered Medications  Medication Dose Route Frequency Provider Last Rate Last Admin   acetaminophen  (TYLENOL ) tablet 650 mg  650 mg Oral Q6H PRN Wladyslawa Disbro, MD       alum & mag hydroxide-simeth (MAALOX/MYLANTA) 200-200-20 MG/5ML suspension 30 mL  30 mL Oral Q4H PRN Donnelly Mellow, MD       aspirin  EC tablet 81 mg  81 mg Oral Daily Sakara Lehtinen, MD   81 mg at 10/13/23 9167   chlordiazePOXIDE  (LIBRIUM ) capsule 25 mg  25 mg Oral QID Millington, Matthew E, PA-C   25 mg at 10/13/23 9167   Followed by   NOREEN ON 10/14/2023] chlordiazePOXIDE  (LIBRIUM ) capsule 25 mg  25 mg Oral TID Millington, Matthew E, PA-C       Followed by   NOREEN ON 10/15/2023] chlordiazePOXIDE  (LIBRIUM ) capsule 25 mg  25 mg Oral BH-qamhs Millington, Donnice BRAVO, PA-C       Followed by   NOREEN ON 10/16/2023] chlordiazePOXIDE  (LIBRIUM ) capsule 25 mg  25 mg Oral Daily Millington, Matthew E, PA-C       haloperidol  (HALDOL ) tablet 5 mg  5 mg Oral TID PRN Jazlynn Nemetz, MD       And   diphenhydrAMINE  (BENADRYL ) capsule 50 mg  50 mg Oral TID PRN Vaishnavi Dalby, MD       haloperidol  lactate (HALDOL ) injection 5 mg  5 mg Intramuscular TID PRN Amanie Mcculley, MD       And   diphenhydrAMINE  (BENADRYL ) injection 50 mg  50 mg Intramuscular TID PRN Nalani Andreen, MD       And   LORazepam  (ATIVAN ) injection 2 mg  2 mg Intramuscular TID PRN Ilya Ess, MD       haloperidol  lactate (HALDOL ) injection 10 mg  10 mg Intramuscular TID PRN Shabre Kreher, MD       And   diphenhydrAMINE  (BENADRYL ) injection 50 mg  50 mg Intramuscular TID PRN Israella Hubert,  Reda Gettis, MD       And   LORazepam  (ATIVAN ) injection 2 mg  2 mg Intramuscular TID PRN Clifton Kovacic, MD       folic acid  (FOLVITE )  tablet 1 mg  1 mg Oral Daily Master Touchet, MD   1 mg at 10/13/23 9167   hydrOXYzine  (ATARAX ) tablet 25 mg  25 mg Oral Q6H PRN Donnelly Mellow, MD       loperamide  (IMODIUM ) capsule 2-4 mg  2-4 mg Oral PRN Millington, Matthew E, PA-C       magnesium  hydroxide (MILK OF MAGNESIA) suspension 30 mL  30 mL Oral Daily PRN Britani Beattie, MD       multivitamin with minerals tablet 1 tablet  1 tablet Oral Daily Yuma Blucher, MD   1 tablet at 10/13/23 9167   ondansetron  (ZOFRAN -ODT) disintegrating tablet 4 mg  4 mg Oral Q6H PRN Millington, Matthew E, PA-C       thiamine  (VITAMIN B1) tablet 100 mg  100 mg Oral Daily Ostin Mathey, MD   100 mg at 10/13/23 9167   traZODone  (DESYREL ) tablet 50 mg  50 mg Oral QHS PRN Verbena Boeding, MD   50 mg at 10/12/23 2129   PTA Medications: Medications Prior to Admission  Medication Sig Dispense Refill Last Dose/Taking   aspirin  EC 81 MG tablet Take 1 tablet (81 mg total) by mouth daily. Swallow whole. (Patient not taking: Reported on 10/11/2023) 30 tablet 0    folic acid  (FOLVITE ) 1 MG tablet Take 1 tablet (1 mg total) by mouth daily. (Patient not taking: Reported on 10/11/2023) 30 tablet 0    Multiple Vitamin (MULTIVITAMIN WITH MINERALS) TABS tablet Take 1 tablet by mouth daily. (Patient not taking: Reported on 10/11/2023) 30 tablet 0    thiamine  (VITAMIN B1) 100 MG tablet Take 1 tablet (100 mg total) by mouth daily. (Patient not taking: Reported on 10/11/2023) 30 tablet 0     Psychiatric Specialty Exam:  Presentation  General Appearance:  Appropriate for Environment; Casual  Eye Contact: Fair  Speech: Clear and Coherent  Speech Volume: Normal    Mood and Affect  Mood: Anxious; Depressed  Affect: Blunt; Depressed; Flat   Thought Process  Thought Processes: Coherent  Descriptions of Associations:Intact  Orientation:Full (Time, Place and Person)  Thought Content:Illogical  Hallucinations:Hallucinations: None  Ideas of  Reference:None  Suicidal Thoughts:Suicidal Thoughts: Yes, Passive  Homicidal Thoughts:Homicidal Thoughts: No   Sensorium  Memory: Immediate Fair; Recent Fair; Remote Fair  Judgment: Impaired  Insight: Shallow   Executive Functions  Concentration: Fair  Attention Span: Fair  Recall: [employer] of Knowledge: Fair  Language: Fair   Psychomotor Activity  Psychomotor Activity: Psychomotor Activity: Normal   Assets  Assets: Communication Skills; Desire for Improvement; Resilience; Social Support    Musculoskeletal: Strength & Muscle Tone: within normal limits Gait & Station: normal  Physical Exam: Physical Exam Vitals and nursing note reviewed.  HENT:     Head: Normocephalic.  Cardiovascular:     Rate and Rhythm: Normal rate.  Musculoskeletal:     Cervical back: Normal range of motion.  Neurological:     Mental Status: He is alert.    Review of Systems  Constitutional: Negative.   HENT: Negative.    Eyes: Negative.   Cardiovascular: Negative.   Skin: Negative.    Blood pressure 116/85, pulse 94, temperature 98.6 F (37 C), resp. rate 20, SpO2 96%. There is no height or weight on file to calculate BMI.  Principal Diagnosis:  MDD (major depressive disorder), recurrent severe, without psychosis (HCC) Diagnosis:  Principal Problem:   MDD (major depressive disorder), recurrent severe, without psychosis (HCC)   Clinical Decision Making: Patient currently admitted for severe alcohol use, withdrawal with no complications, depression and suicidal statements made to his wife.  Patient remains superficially engaged and lacks insight into his mental health and substance use problems.  He is admitted on IVC.  He needs to be monitored closely for depression and medication management  Treatment Plan Summary:  Safety and Monitoring:             -- Voluntary admission to inpatient psychiatric unit for safety, stabilization and treatment             -- Daily  contact with patient to assess and evaluate symptoms and progress in treatment             -- Patient's case to be discussed in multi-disciplinary team meeting             -- Observation Level: q15 minute checks             -- Vital signs:  q12 hours             -- Precautions: suicide, elopement, and assault   2. Psychiatric Diagnoses and Treatment:               Initiated Zoloft  25 mg daily to help with the mood and depression CIWA protocol and Librium  taper-patient reports that he is refusing Librium  at this time.  Patient was discussed and explained the reason for Librium  taper and that he will not be taking this at home once he completes the taper.  Patient seems to be reasoning with it and probably will continue the Librium  tap Patient [facility]   -- The risks/benefits/side-effects/alternatives to this medication were discussed in detail with the patient and time was given for questions. The patient consents to medication trial.                -- Metabolic profile and EKG monitoring obtained while on an atypical antipsychotic (BMI: Lipid Panel: HbgA1c: QTc:)              -- Encouraged patient to participate in unit milieu and in scheduled group therapies                            3. Medical Issues Being Addressed:      4. Discharge Planning:              -- Social work and case management to assist with discharge planning and identification of hospital follow-up needs prior to discharge             -- Estimated LOS: 5-7 days             -- Discharge Concerns: Need to establish a safety plan; Medication compliance and effectiveness             -- Discharge Goals: Return home with outpatient referrals follow ups  Physician Treatment Plan for Primary Diagnosis: MDD (major depressive disorder), recurrent severe, without psychosis (HCC) Long Term Goal(s): Improvement in symptoms so as ready for discharge  Short Term Goals: Ability to  identify changes in lifestyle to reduce recurrence of condition will improve, Ability to verbalize feelings will improve, Ability to disclose and discuss suicidal ideas, Ability to demonstrate self-control  will improve, and Ability to identify and develop effective coping behaviors will improve  Physician Treatment Plan for Secondary Diagnosis: Principal Problem:   MDD (major depressive disorder), recurrent severe, without psychosis (HCC)  Long Term Goal(s): Improvement in symptoms so as ready for discharge  Short Term Goals: Ability to identify changes in lifestyle to reduce recurrence of condition will improve, Ability to verbalize feelings will improve, Ability to disclose and discuss suicidal ideas, Ability to demonstrate self-control will improve, Ability to identify and develop effective coping behaviors will improve, and Ability to maintain clinical measurements within normal limits will improve  I certify that inpatient services furnished can reasonably be expected to improve the patient's condition.    Savalas Monje, MD 8/15/202512:24 PM

## 2023-10-13 NOTE — BH IP Treatment Plan (Signed)
 Interdisciplinary Treatment and Diagnostic Plan Update  10/13/2023 Time of Session: 11:19 AM Levi Mejia [facility] MRN: [id number]  Principal Diagnosis: MDD (major depressive disorder), recurrent severe, without psychosis (HCC)  Secondary Diagnoses: Principal Problem:   MDD (major depressive disorder), recurrent severe, without psychosis (HCC)   Current Medications:  Current Facility-Administered Medications  Medication Dose Route Frequency Provider Last Rate Last Admin   acetaminophen  (TYLENOL ) tablet 650 mg  650 mg Oral Q6H PRN Jadapalle, Sree, MD       alum & mag hydroxide-simeth (MAALOX/MYLANTA) 200-200-20 MG/5ML suspension 30 mL  30 mL Oral Q4H PRN Jadapalle, Sree, MD       aspirin  EC tablet 81 mg  81 mg Oral Daily Jadapalle, Sree, MD   81 mg at 10/13/23 9167   chlordiazePOXIDE  (LIBRIUM ) capsule 25 mg  25 mg Oral QID Millington, Matthew E, PA-C   25 mg at 10/13/23 9167   Followed by   NOREEN ON 10/14/2023] chlordiazePOXIDE  (LIBRIUM ) capsule 25 mg  25 mg Oral TID Millington, Matthew E, PA-C       Followed by   NOREEN ON 10/15/2023] chlordiazePOXIDE  (LIBRIUM ) capsule 25 mg  25 mg Oral BH-qamhs Millington, Donnice BRAVO, PA-C       Followed by   NOREEN ON 10/16/2023] chlordiazePOXIDE  (LIBRIUM ) capsule 25 mg  25 mg Oral Daily Millington, Matthew E, PA-C       haloperidol  (HALDOL ) tablet 5 mg  5 mg Oral TID PRN Jadapalle, Sree, MD       And   diphenhydrAMINE  (BENADRYL ) capsule 50 mg  50 mg Oral TID PRN Jadapalle, Sree, MD       haloperidol  lactate (HALDOL ) injection 5 mg  5 mg Intramuscular TID PRN Jadapalle, Sree, MD       And   diphenhydrAMINE  (BENADRYL ) injection 50 mg  50 mg Intramuscular TID PRN Jadapalle, Sree, MD       And   LORazepam  (ATIVAN ) injection 2 mg  2 mg Intramuscular TID PRN Jadapalle, Sree, MD       haloperidol  lactate (HALDOL ) injection 10 mg  10 mg Intramuscular TID PRN Jadapalle, Sree, MD       And   diphenhydrAMINE  (BENADRYL ) injection 50 mg  50 mg Intramuscular  TID PRN Jadapalle, Sree, MD       And   LORazepam  (ATIVAN ) injection 2 mg  2 mg Intramuscular TID PRN Jadapalle, Sree, MD       folic acid  (FOLVITE ) tablet 1 mg  1 mg Oral Daily Jadapalle, Sree, MD   1 mg at 10/13/23 9167   hydrOXYzine  (ATARAX ) tablet 25 mg  25 mg Oral Q6H PRN Jadapalle, Sree, MD       loperamide  (IMODIUM ) capsule 2-4 mg  2-4 mg Oral PRN Millington, Matthew E, PA-C       magnesium  hydroxide (MILK OF MAGNESIA) suspension 30 mL  30 mL Oral Daily PRN Jadapalle, Sree, MD       multivitamin with minerals tablet 1 tablet  1 tablet Oral Daily Jadapalle, Sree, MD   1 tablet at 10/13/23 9167   ondansetron  (ZOFRAN -ODT) disintegrating tablet 4 mg  4 mg Oral Q6H PRN Millington, Matthew E, PA-C       thiamine  (VITAMIN B1) tablet 100 mg  100 mg Oral Daily Jadapalle, Sree, MD   100 mg at 10/13/23 9167   traZODone  (DESYREL ) tablet 50 mg  50 mg Oral QHS PRN Jadapalle, Sree, MD   50 mg at 10/12/23 2129   PTA Medications: Medications Prior to Admission  Medication  Sig Dispense Refill Last Dose/Taking   aspirin  EC 81 MG tablet Take 1 tablet (81 mg total) by mouth daily. Swallow whole. (Patient not taking: Reported on 10/11/2023) 30 tablet 0    folic acid  (FOLVITE ) 1 MG tablet Take 1 tablet (1 mg total) by mouth daily. (Patient not taking: Reported on 10/11/2023) 30 tablet 0    Multiple Vitamin (MULTIVITAMIN WITH MINERALS) TABS tablet Take 1 tablet by mouth daily. (Patient not taking: Reported on 10/11/2023) 30 tablet 0    thiamine  (VITAMIN B1) 100 MG tablet Take 1 tablet (100 mg total) by mouth daily. (Patient not taking: Reported on 10/11/2023) 30 tablet 0     Patient Stressors: Financial difficulties   Occupational concerns   Substance abuse    Patient Strengths: Active sense of humor  Supportive family/friends   Treatment Modalities: Medication Management, Group therapy, Case management,  1 to 1 session with clinician, Psychoeducation, Recreational therapy.   Physician Treatment Plan for  Primary Diagnosis: MDD (major depressive disorder), recurrent severe, without psychosis (HCC) Long Term Goal(s):     Short Term Goals:    Medication Management: Evaluate patient's response, side effects, and tolerance of medication regimen.  Therapeutic Interventions: 1 to 1 sessions, Unit Group sessions and Medication administration.  Evaluation of Outcomes: Not Met  Physician Treatment Plan for Secondary Diagnosis: Principal Problem:   MDD (major depressive disorder), recurrent severe, without psychosis (HCC)  Long Term Goal(s):     Short Term Goals:       Medication Management: Evaluate patient's response, side effects, and tolerance of medication regimen.  Therapeutic Interventions: 1 to 1 sessions, Unit Group sessions and Medication administration.  Evaluation of Outcomes: Not Met   RN Treatment Plan for Primary Diagnosis: MDD (major depressive disorder), recurrent severe, without psychosis (HCC) Long Term Goal(s): Knowledge of disease and therapeutic regimen to maintain health will improve  Short Term Goals: Ability to verbalize frustration and anger appropriately will improve, Ability to demonstrate self-control, Ability to participate in decision making will improve, Ability to verbalize feelings will improve, Ability to disclose and discuss suicidal ideas, and Ability to identify and develop effective coping behaviors will improve  Medication Management: RN will administer medications as ordered by provider, will assess and evaluate patient's response and provide education to patient for prescribed medication. RN will report any adverse and/or side effects to prescribing provider.  Therapeutic Interventions: 1 on 1 counseling sessions, Psychoeducation, Medication administration, Evaluate responses to treatment, Monitor vital signs and CBGs as ordered, Perform/monitor CIWA, COWS, AIMS and Fall Risk screenings as ordered, Perform wound care treatments as ordered.  Evaluation  of Outcomes: Not Met   LCSW Treatment Plan for Primary Diagnosis: MDD (major depressive disorder), recurrent severe, without psychosis (HCC) Long Term Goal(s): Safe transition to appropriate next level of care at discharge, Engage patient in therapeutic group addressing interpersonal concerns.  Short Term Goals: Engage patient in aftercare planning with referrals and resources, Increase social support, Increase ability to appropriately verbalize feelings, Increase emotional regulation, Facilitate acceptance of mental health diagnosis and concerns, Facilitate patient progression through stages of change regarding substance use diagnoses and concerns, Identify triggers associated with mental health/substance abuse issues, and Increase skills for wellness and recovery  Therapeutic Interventions: Assess for all discharge needs, 1 to 1 time with Social worker, Explore available resources and support systems, Assess for adequacy in community support network, Educate family and significant other(s) on suicide prevention, Complete Psychosocial Assessment, Interpersonal group therapy.  Evaluation of Outcomes: Not Met   Progress  in Treatment: Attending groups: Yes. and No. Participating in groups: Yes. and No. Taking medication as prescribed: Yes. Toleration medication: Yes. Family/Significant other contact made: No, will contact:  CSW to contact once permission is granted.  Patient understands diagnosis: Yes. Discussing patient identified problems/goals with staff: Yes. Medical problems stabilized or resolved: Yes. Denies suicidal/homicidal ideation: Yes. Issues/concerns per patient self-inventory: No. Other: None  New problem(s) identified: No, Describe:  None  New Short Term/Long Term Goal(s):detox, elimination of symptoms of psychosis, medication management for mood stabilization; elimination of SI thoughts; development of comprehensive mental wellness/sobriety plan.    Patient Goals:  I'm  here to stop drinking.  Discharge Plan or Barriers: CSW to assist with the development of appropriate discharge plan.    Reason for Continuation of Hospitalization: Anxiety Depression Medication stabilization Suicidal ideation Withdrawal symptoms  Estimated Length of Stay: 1-7 days.   Last 3 [place] Suicide Severity Risk Score: Flowsheet Row Admission (Current) from 10/12/2023 in [facility] INPAT[facility]ICINE ED from 10/10/2023 in [facility] ED to Hosp-Admission (Discharged) from 09/18/2023 in [facility] ICU/CCU  C-SSRS RISK CATEGORY Moderate Risk No Risk No Risk    Last PHQ 2/9 Scores:    10/14/2022    3:51 PM 08/08/2022    2:24 PM 08/01/2022    1:56 PM  Depression screen PHQ 2/9  Decreased Interest 0 0 0  Down, Depressed, Hopeless 0 0 0  PHQ - 2 Score 0 0 0  Altered sleeping 0 0 0  Tired, decreased energy 0 0 0  Change in appetite 3 0 0  Feeling bad or failure about yourself  0 0 0  Trouble concentrating 0 0 0  Moving slowly or fidgety/restless 0 0 0  Suicidal thoughts 0 0 0  PHQ-9 Score 3 0 0  Difficult doing work/chores Very difficult Not difficult at all Not difficult at all    Scribe for Treatment Team: Able Malloy M Susen Haskew, LCSW 10/13/2023 12:04 PM

## 2023-10-13 NOTE — Group Note (Signed)
 Recreation Therapy Group Note   Group Topic:Leisure Education  Group Date: 10/13/2023 Start Time: 1530 End Time: 1635 Facilitators: Celestia Jeoffrey FORBES ARTICE, CTRS  Location: Craft Room  Group Description: Leisure. Patients were given the option to choose from singing karaoke, coloring mandalas, using oil pastels, journaling, painting or playing with play-doh. LRT and pts discussed the meaning of leisure, the importance of participating in leisure during their free time/when they're outside of the hospital, as well as how our leisure interests can also serve as coping skills.   Goal Area(s) Addressed:  Patient will identify a current leisure interest.  Patient will learn the definition of "leisure". Patient will practice making a positive decision. Patient will have the opportunity to try a new leisure activity. Patient will communicate with peers and LRT.    Affect/Mood: Appropriate   Participation Level: Active and Engaged   Participation Quality: Independent   Behavior: Calm and Cooperative   Speech/Thought Process: Coherent   Insight: Fair   Judgement: Fair    Modes of Intervention: Education, Exploration, and Music   Patient Response to Interventions:  Attentive, Engaged, and Receptive   Education Outcome:  Acknowledges education   Clinical Observations/Individualized Feedback: Levi Mejia was active in their participation of session activities and group discussion. Pt identified gaming and cooking as things he does in his free time. Pt chose to listen to music while in group.    Plan: Continue to engage patient in RT group sessions 2-3x/week.   Jeoffrey FORBES Celestia, LRT, CTRS 10/13/2023 5:33 PM

## 2023-10-13 NOTE — Plan of Care (Signed)
  Problem: Education: Goal: Emotional status will improve Outcome: Progressing Goal: Mental status will improve Outcome: Progressing Goal: Verbalization of understanding the information provided will improve Outcome: Progressing   Problem: Coping: Goal: Ability to verbalize frustrations and anger appropriately will improve Outcome: Progressing Goal: Ability to demonstrate self-control will improve Outcome: Progressing   Problem: Health Behavior/Discharge Planning: Goal: Compliance with treatment plan for underlying cause of condition will improve Outcome: Progressing   Problem: Activity: Goal: Interest or engagement in activities will improve Outcome: Not Progressing Note: Patient has slept for majority of the day. Patient reports being sleepy from his scheduled medications Goal: Sleeping patterns will improve Outcome: Not Progressing

## 2023-10-13 NOTE — Group Note (Signed)
 Date:  10/13/2023 Time:  11:31 PM  Group Topic/Focus:  Goals Group:   The focus of this group is to help patients establish daily goals to achieve during treatment and discuss how the patient can incorporate goal setting into their daily lives to aide in recovery. Personal Choices and Values:   The focus of this group is to help patients assess and explore the importance of values in their lives, how their values affect their decisions, how they express their values and what opposes their expression. Self Esteem Action Plan:   The focus of this group is to help patients create a plan to continue to build self-esteem after discharge.    Participation Level:  Active  Participation Quality:  Appropriate and Attentive  Affect:  Appropriate  Cognitive:  Alert, Appropriate, and Oriented  Insight: Appropriate and Good  Engagement in Group:  Engaged  Modes of Intervention:  Discussion and Support  Additional Comments:  N/A  Butler LITTIE Gelineau 10/13/2023, 11:31 PM

## 2023-10-13 NOTE — BHH Suicide Risk Assessment (Signed)
 [facility] Admission Suicide Risk Assessment   Nursing information obtained from:  Patient Demographic factors:  Male, Caucasian, Low socioeconomic status, Access to firearms Current Mental Status:  Suicidal ideation indicated by others (Collateral from family) Loss Factors:  Decrease in vocational status, Financial problems / change in socioeconomic status Historical Factors:  Family history of suicide, Family history of mental illness or substance abuse Risk Reduction Factors:  Sense of responsibility to family, Living with another person, especially a relative, Positive social support, Positive therapeutic relationship  Total Time spent with patient: 30 minutes Principal Problem: MDD (major depressive disorder), recurrent severe, without psychosis (HCC) Diagnosis:  Principal Problem:   MDD (major depressive disorder), recurrent severe, without psychosis (HCC)  Subjective Data: Levi Mejia is a 53 y.o. male admitted: Presented to the [facility] 10/10/2023  9:47 PM for nausea, vomiting, and evaluation after making suicidal statements in the context of heavy alcohol use. He carries the psychiatric diagnoses of alcohol use disorder and unspecified depressive disorder, and has a past medical history of hypertension and prior alcohol withdrawal admission in July 2025, when he presented with seizures or possible pseudoseizures and had an ethanol level >200. Patient is admitted to adult psych unit with Q15 min safety monitoring. Multidisciplinary team approach is offered. Medication management; group/milieu therapy is offered.   Continued Clinical Symptoms:  Alcohol Use Disorder Identification Test Final Score (AUDIT): 30 The Alcohol Use Disorders Identification Test, Guidelines for Use in Primary Care, Second Edition.  World Science writer [facility]). Score between 0-7:  no or low risk or alcohol related problems. Score between 8-15:  moderate risk of alcohol related problems. Score between  16-19:  high risk of alcohol related problems. Score 20 or above:  warrants further diagnostic evaluation for alcohol dependence and treatment.   CLINICAL FACTORS:   Depression:   Comorbid alcohol abuse/dependence   Musculoskeletal: Strength & Muscle Tone: within normal limits Gait & Station: normal Patient leans: N/A  Psychiatric Specialty Exam:  Presentation  General Appearance:  Appropriate for Environment; Casual  Eye Contact: Fair  Speech: Clear and Coherent  Speech Volume: Normal  Handedness: Right   Mood and Affect  Mood: Anxious; Depressed  Affect: Blunt; Depressed; Flat   Thought Process  Thought Processes: Coherent  Descriptions of Associations:Intact  Orientation:Full (Time, Place and Person)  Thought Content:Illogical  History of Schizophrenia/Schizoaffective disorder:No  Duration of Psychotic Symptoms:No data recorded Hallucinations:Hallucinations: None  Ideas of Reference:None  Suicidal Thoughts:Suicidal Thoughts: Yes, Passive  Homicidal Thoughts:Homicidal Thoughts: No   Sensorium  Memory: Immediate Fair; Recent Fair; Remote Fair  Judgment: Impaired  Insight: Shallow   Executive Functions  Concentration: Fair  Attention Span: Fair  Recall: [employer] of Knowledge: Fair  Language: Fair   Psychomotor Activity  Psychomotor Activity: Psychomotor Activity: Normal   Assets  Assets: Communication Skills; Desire for Improvement; Resilience; Social Support   Sleep  Sleep: Sleep: Fair    Physical Exam: Physical Exam ROS Blood pressure 116/85, pulse 94, temperature 98.6 F (37 C), resp. rate 20, SpO2 96%. There is no height or weight on file to calculate BMI.   COGNITIVE FEATURES THAT CONTRIBUTE TO RISK:  None    SUICIDE RISK:   Minimal: No identifiable suicidal ideation.  Patients presenting with no risk factors but with morbid ruminations; may be classified as minimal risk based on the severity  of the depressive symptoms  PLAN OF CARE: Patient is admitted to [facility] psych unit with Q15 min safety monitoring. Multidisciplinary team approach is offered. Medication  management; group/milieu therapy is offered.   I certify that inpatient services furnished can reasonably be expected to improve the patient's condition.   Allyn Foil, MD 10/13/2023, 12:23 PM

## 2023-10-13 NOTE — Progress Notes (Addendum)
 Pt visible and engaging, note to have some difficulty identifying his room; redirected by staff.  Pt is anxious and restless, VSS, except at 0000 hours, HR elevated.  He remains on CIWA with not noted acute withdrawal syndromes.  Support provided, encouraged fluid, and safety maintained.  Pt is care compliant.  When he awoke at about 0600 hours, Pt denied withdrawal symptoms but informed that he had been having elusive dreams.   10/12/23 2200  Psych Admission Type (Psych Patients Only)  Admission Status Involuntary  Psychosocial Assessment  Patient Complaints Anxiety  Eye Contact Fair  Facial Expression Flat  Affect Anxious  Speech Logical/coherent  Interaction Cautious  Motor Activity Slow  Appearance/Hygiene Unremarkable;In scrubs  Behavior Characteristics Anxious  Mood Anxious  Thought Process  Coherency WDL  Content WDL  Delusions None reported or observed  Perception WDL  Hallucination None reported or observed  Judgment Limited  Confusion Mild  Danger to Self  Current suicidal ideation? Denies  Agreement Not to Harm Self Yes  Description of Agreement Verbal  Danger to Others  Danger to Others None reported or observed    Problem: Education: Goal: Knowledge of [facility] Education information/materials will improve Outcome: Progressing Goal: Emotional status will improve Outcome: Progressing Goal: Mental status will improve Outcome: Progressing Goal: Verbalization of understanding the information provided will improve Outcome: Progressing   Problem: Activity: Goal: Interest or engagement in activities will improve Outcome: Progressing Goal: Sleeping patterns will improve Outcome: Progressing   Problem: Coping: Goal: Ability to verbalize frustrations and anger appropriately will improve Outcome: Progressing Goal: Ability to demonstrate self-control will improve Outcome: Progressing
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
## 2023-10-13 NOTE — Progress Notes (Signed)
   10/13/23 9077  Psych Admission Type (Psych Patients Only)  Admission Status Involuntary  Psychosocial Assessment  Patient Complaints None  Eye Contact Fair  Facial Expression Flat  Affect Flat  Speech Logical/coherent  Interaction Assertive  Motor Activity Slow  Appearance/Hygiene Unremarkable  Behavior Characteristics Guarded  Mood Pleasant  Aggressive Behavior  Effect No apparent injury  Thought Process  Coherency WDL  Content WDL  Delusions None reported or observed  Perception WDL  Hallucination None reported or observed  Danger to Self  Current suicidal ideation? Denies  Agreement Not to Harm Self Yes  Description of Agreement verbal  Danger to Others  Danger to Others None reported or observed

## 2023-10-13 NOTE — Group Note (Signed)
 Recreation Therapy Group Note   Group Topic:Health and Wellness  Group Date: 10/13/2023 Start Time: 1020 End Time: 1120 Facilitators: Celestia Jeoffrey BRAVO, LRT, CTRS Location: Courtyard  Group Description: [employer]. LRT and patients played games of basketball, drew with chalk, and played corn hole while outside in the courtyard while getting fresh air and sunlight. Music was being played in the background. LRT and peers conversed about different games they have played before, what they do in their free time and anything else that is on their minds. LRT encouraged pts to drink water after being outside, sweating and getting their heart rate up.  Goal Area(s) Addressed: Patient will build on frustration tolerance skills. Patients will partake in a competitive play game with peers. Patients will gain knowledge of new leisure interest/hobby.    Affect/Mood: N/A   Participation Level: Did not attend    Clinical Observations/Individualized Feedback: Patient did not attend group.   Plan: Continue to engage patient in RT group sessions 2-3x/week.   Jeoffrey BRAVO Celestia, LRT, CTRS 10/13/2023 11:52 AM

## 2023-10-14 NOTE — Group Note (Signed)
 LCSW Group Therapy Note  Group Date: 10/14/2023 Start Time: 1300 End Time: 1400   Type of Therapy and Topic:  Group Therapy - Healthy vs Unhealthy Coping Skills  Participation Level:  Did Not Attend   Description of Group The focus of this group was to determine what unhealthy coping techniques typically are used by group members and what healthy coping techniques would be helpful in coping with various problems. Patients were guided in becoming aware of the differences between healthy and unhealthy coping techniques. Patients were asked to identify 2-3 healthy coping skills they would like to learn to use more effectively.  Therapeutic Goals Patients learned that coping is what human beings do all day long to deal with various situations in their lives Patients defined and discussed healthy vs unhealthy coping techniques Patients identified their preferred coping techniques and identified whether these were healthy or unhealthy Patients determined 2-3 healthy coping skills they would like to become more familiar with and use more often. Patients provided support and ideas to each other   Summary of Patient Progress:  Patient did not attend group.    Therapeutic Modalities Cognitive Behavioral Therapy Motivational Interviewing  Levi Mejia 10/14/2023  2:58 PM

## 2023-10-14 NOTE — Progress Notes (Signed)
   10/14/23 1200  Psych Admission Type (Psych Patients Only)  Admission Status Involuntary  Psychosocial Assessment  Patient Complaints None  Eye Contact Fair  Facial Expression Flat  Affect Flat  Speech Logical/coherent  Interaction Minimal  Motor Activity Slow  Appearance/Hygiene Unremarkable  Behavior Characteristics Unwilling to participate (Patient refusing medications and remaining in his room the majority of the shift.)  Mood Pleasant;Sullen (Patietn writes that his goal for today is going home. Patient reports he is unsure why he is here and that he is ready to go home.)  Thought Process  Coherency WDL  Content WDL  Delusions None reported or observed  Perception WDL  Hallucination None reported or observed  Judgment Poor  Confusion None  Danger to Self  Current suicidal ideation? Denies  Danger to Others  Danger to Others None reported or observed

## 2023-10-14 NOTE — Progress Notes (Signed)
   10/13/23 2300  Psych Admission Type (Psych Patients Only)  Admission Status Involuntary  Psychosocial Assessment  Patient Complaints None  Eye Contact Fair  Facial Expression Flat  Affect Flat  Speech Logical/coherent  Interaction Assertive  Motor Activity Slow  Appearance/Hygiene Unremarkable;In scrubs  Behavior Characteristics Calm;Guarded  Mood Pleasant  Aggressive Behavior  Effect No apparent injury  Thought Process  Coherency WDL  Content WDL  Delusions None reported or observed  Perception WDL  Hallucination None reported or observed  Judgment Limited  Confusion WDL  Danger to Self  Current suicidal ideation? Denies  Agreement Not to Harm Self Yes  Description of Agreement Verbal  Danger to Others  Danger to Others None reported or observed   Pt alert and able to make needs known. Refused librium  stating, I don't like the way it makes me feel.  Resting in bed at this time with no further complaints.

## 2023-10-14 NOTE — Progress Notes (Signed)
 [facility] MD Progress Note  10/14/2023 4:10 PM Caroll Cunnington  MRN:  [id number]  History of Present Illness: Levi Mejia is a 53 y.o. male admitted: Presented to the [facility] 10/10/2023  9:47 PM for nausea, vomiting, and evaluation after making suicidal statements in the context of heavy alcohol use. He carries the psychiatric diagnoses of alcohol use disorder and unspecified depressive disorder, and has a past medical history of hypertension and prior alcohol withdrawal admission in July 2025, when he presented with seizures or possible pseudoseizures and had an ethanol level >200. Patient is admitted to adult psych unit with Q15 min safety monitoring. Multidisciplinary team approach is offered. Medication management; group/milieu therapy is offered.    Patient is noted to be resting in bed.  He was very minimizing and superficially engaged in the interview.  He reports living with his wife and kids of age 75, 35.  He reports having history of depression and reports having depression for the last few months.  He reports being unemployed for the last 3 to 4 months and when asked for the reason he initially kept quiet but did acknowledge that because of his addiction to alcohol and multiple absences to work he lost his job.  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  He did acknowledge going through financial burden because of unemployment.  He reports poor appetite and sleep.  He reports low energy and poor motivation.  He denies current SI/HI/intent/plan but did acknowledge that he did send the text messages about wanting to kill himself and end it all to his wife.  He reports that his wife was at home with his and does text messages.  When asked for the reason for the suicidal thoughts or the duration of suicidal thoughts patient remains silent and reports I do not know .  Provider tried multiple interventions to give him space to talk about his suicidal thoughts but patient would  not engage but clearly he became tearful during the discussion and kept saying I do not know .  He denies auditory/visual hallucinations, denies any anxiety or panic attacks, denies current or previous episodes of mania/hypomania, denies nightmares or flashbacks.  He did acknowledge problems with alcohol for many years and he reports drinking vodka half to 2 fifth bottle on daily basis with history of withdrawal seizures but denies any DTs.  10/14/2023: Chart reviewed, case discussed in multidisciplinary meeting, patient seen during rounds. Patient is reassessed today on the inpatient unit. He has been refusing librium  and states that it makes me feel funny and does not think that he needs it. Patient reports that he is sleeping well, denies withdrawal symptoms, and has questions about his discharge. He endorses improved depression and anxiety symptoms at this time. Denies cravings. Reports good sleep and appetite. Denies SI, HI, AVH.    Sleep: Good  Appetite:  Good  Past Psychiatric History: see h&P Family History:  Family History  Problem Relation Age of Onset   Coronary artery disease Mother 102       Stent   Cancer Mother        Bone   Hypertension Mother    Heart failure Father    Heart attack Father 19   Hypertension Father    Alcohol abuse Father    Hypertension Brother    Diabetes Neg Hx    Prostate cancer Neg Hx    Colon cancer Neg Hx    Heart disease Neg Hx  Social History:  Social History   Substance and Sexual Activity  Alcohol Use Yes   Comment: 1/5 liquor daily     Social History   Substance and Sexual Activity  Drug Use Not Currently   Types: Marijuana    Social History   Socioeconomic History   Marital status: Married    Spouse name: Not on file   Number of children: 1   Years of education: Not on file   Highest education level: 12th grade  Occupational History   Occupation: Curator    Comment: Engineering geologist  Tobacco Use   Smoking status:  Never   Smokeless tobacco: Never  Vaping Use   Vaping status: Former  Substance and Sexual Activity   Alcohol use: Yes    Comment: 1/5 liquor daily   Drug use: Not Currently    Types: Marijuana   Sexual activity: Not Currently  Other Topics Concern   Not on file  Social History Narrative   Divorced   Common law marriage since 2006      1 daughter      Right handed   Social Drivers of Health   Financial Resource Strain: Low Risk  (03/22/2023)   Received [facility]   Overall Financial Resource Strain (CARDIA)    Difficulty of Paying Living Expenses: Not very hard  Food Insecurity: No Food Insecurity (10/12/2023)   Hunger Vital Sign    Worried About Running Out of Food in the Last Year: Never true    Ran Out of Food in the Last Year: Never true  Transportation Needs: No Transportation Needs (10/12/2023)   PRAPARE - Administrator, Civil Service (Medical): No    Lack of Transportation (Non-Medical): No  Physical Activity: Sufficiently Active (08/08/2022)   Exercise Vital Sign    Days of Exercise per Week: 5 days    Minutes of Exercise per Session: 40 min  Recent Concern: Physical Activity - Inactive (07/14/2022)   Exercise Vital Sign    Days of Exercise per Week: 0 days    Minutes of Exercise per Session: 0 min  Stress: No Stress Concern Present (08/08/2022)   [employer] of Occupational Health - Occupational Stress Questionnaire    Feeling of Stress : Not at all  Social Connections: Moderately Integrated (10/12/2023)   Social Connection and Isolation Panel    Frequency of Communication with Friends and Family: Once a week    Frequency of Social Gatherings with Friends and Family: Once a week    Attends Religious Services: More than 4 times per year    Active Member of Clubs or Organizations: No    Attends Engineer, structural: More than 4 times per year    Marital Status: Married   Past Medical History:  Past Medical History:   Diagnosis Date   Alcohol abuse    Attention deficit disorder without mention of hyperactivity    HTN (hypertension)    Hyperlipidemia 07/03/2022   ICH (intracerebral hemorrhage) (HCC)    Seizures (HCC)    Stroke [facility])     Past Surgical History:  Procedure Laterality Date   ESOPHAGOGASTRODUODENOSCOPY (EGD) WITH PROPOFOL  N/A 07/04/2022   Procedure: ESOPHAGOGASTRODUODENOSCOPY (EGD) WITH PROPOFOL ;  Surgeon: Unk Corinn Skiff, MD;  Location: [facility] ENDOSCOPY;  Service: Endoscopy;  Laterality: N/A;   Hand tendon reattachment  02/28/1990   Right hand   toe reattachment  02/28/1994   Right 1st toe    Current Medications: Current Facility-Administered Medications  Medication Dose Route Frequency Provider Last Rate Last Admin   acetaminophen  (TYLENOL ) tablet 650 mg  650 mg Oral Q6H PRN Donnelly Mellow, MD       alum & mag hydroxide-simeth (MAALOX/MYLANTA) 200-200-20 MG/5ML suspension 30 mL  30 mL Oral Q4H PRN Jadapalle, Sree, MD       aspirin  EC tablet 81 mg  81 mg Oral Daily Jadapalle, Sree, MD   81 mg at 10/13/23 9167   chlordiazePOXIDE  (LIBRIUM ) capsule 25 mg  25 mg Oral TID Millington, Matthew E, PA-C       Followed by   NOREEN ON 10/15/2023] chlordiazePOXIDE  (LIBRIUM ) capsule 25 mg  25 mg Oral BH-qamhs Millington, Matthew E, PA-C       Followed by   NOREEN ON 10/16/2023] chlordiazePOXIDE  (LIBRIUM ) capsule 25 mg  25 mg Oral Daily Millington, Matthew E, PA-C       haloperidol  (HALDOL ) tablet 5 mg  5 mg Oral TID PRN Jadapalle, Sree, MD       And   diphenhydrAMINE  (BENADRYL ) capsule 50 mg  50 mg Oral TID PRN Jadapalle, Sree, MD       haloperidol  lactate (HALDOL ) injection 5 mg  5 mg Intramuscular TID PRN Jadapalle, Sree, MD       And   diphenhydrAMINE  (BENADRYL ) injection 50 mg  50 mg Intramuscular TID PRN Jadapalle, Sree, MD       And   LORazepam  (ATIVAN ) injection 2 mg  2 mg Intramuscular TID PRN Jadapalle, Sree, MD       haloperidol  lactate (HALDOL ) injection 10 mg  10 mg  Intramuscular TID PRN Jadapalle, Sree, MD       And   diphenhydrAMINE  (BENADRYL ) injection 50 mg  50 mg Intramuscular TID PRN Jadapalle, Sree, MD       And   LORazepam  (ATIVAN ) injection 2 mg  2 mg Intramuscular TID PRN Jadapalle, Sree, MD       folic acid  (FOLVITE ) tablet 1 mg  1 mg Oral Daily Jadapalle, Sree, MD   1 mg at 10/13/23 9167   hydrOXYzine  (ATARAX ) tablet 25 mg  25 mg Oral Q6H PRN Jadapalle, Sree, MD       loperamide  (IMODIUM ) capsule 2-4 mg  2-4 mg Oral PRN Millington, Matthew E, PA-C       magnesium  hydroxide (MILK OF MAGNESIA) suspension 30 mL  30 mL Oral Daily PRN Donnelly Mellow, MD       multivitamin with minerals tablet 1 tablet  1 tablet Oral Daily Jadapalle, Sree, MD   1 tablet at 10/13/23 9167   ondansetron  (ZOFRAN -ODT) disintegrating tablet 4 mg  4 mg Oral Q6H PRN Millington, Matthew E, PA-C       sertraline  (ZOLOFT ) tablet 25 mg  25 mg Oral Daily Jadapalle, Sree, MD   25 mg at 10/13/23 1654   thiamine  (VITAMIN B1) tablet 100 mg  100 mg Oral Daily Jadapalle, Sree, MD   100 mg at 10/13/23 9167   traZODone  (DESYREL ) tablet 50 mg  50 mg Oral QHS PRN Jadapalle, Sree, MD   50 mg at 10/12/23 2129    Lab Results: No results found for this or any previous visit (from the past 48 hours).  Blood Alcohol level:  Lab Results  Component Value Date   ETH 345 [facility]) 10/10/2023   ETH 204 (H) 09/18/2023    Metabolic Disorder Labs: Lab Results  Component Value Date   HGBA1C 5.1 06/23/2022   MPG 85.32 04/07/2022   Lab Results  Component Value Date  PROLACTIN 10.5 09/18/2023   Lab Results  Component Value Date   CHOL 228 (H) 06/23/2022   TRIG 74.0 06/23/2022   HDL 95.50 06/23/2022   CHOLHDL 2 06/23/2022   VLDL 14.8 06/23/2022   LDLCALC 118 (H) 06/23/2022   LDLCALC 123 (H) 04/08/2022    Physical Findings: AIMS:  , ,  ,  ,    CIWA:  CIWA-Ar Total: 0 COWS:      Psychiatric Specialty Exam:  Presentation  General Appearance:  Appropriate for Environment  Eye  Contact: Good  Speech: Normal Rate  Speech Volume: Normal    Mood and Affect  Mood: Euthymic  Affect: Appropriate   Thought Process  Thought Processes: Linear; Coherent  Descriptions of Associations:Intact  Orientation:Full (Time, Place and Person)  Thought Content:WDL  Hallucinations:Hallucinations: None  Ideas of Reference:None  Suicidal Thoughts:Suicidal Thoughts: No  Homicidal Thoughts:Homicidal Thoughts: No   Sensorium  Memory: Immediate Good; Recent Good; Remote Good  Judgment: Good  Insight: Fair   Art therapist  Concentration: Good  Attention Span: Good  Recall: Good  Fund of Knowledge: Good  Language: Good   Psychomotor Activity  Psychomotor Activity: Psychomotor Activity: Normal  Musculoskeletal: Strength & Muscle Tone: within normal limits Gait & Station: normal Assets  Assets: Communication Skills    Physical Exam: Physical Exam Review of Systems  Psychiatric/Behavioral:  Positive for depression and substance abuse.    Blood pressure 121/85, pulse 80, temperature 97.6 F (36.4 C), resp. rate 17, SpO2 97%. There is no height or weight on file to calculate BMI.  Diagnosis: Principal Problem:   MDD (major depressive disorder), recurrent severe, without psychosis (HCC)   PLAN: Safety and Monitoring:  -- Voluntary admission to inpatient psychiatric unit for safety, stabilization and treatment  -- Daily contact with patient to assess and evaluate symptoms and progress in treatment  -- Patient's case to be discussed in multi-disciplinary team meeting  -- Observation Level : q15 minute checks  -- Vital signs:  q12 hours  -- Precautions: suicide, elopement, and assault -- Encouraged patient to participate in unit milieu and in scheduled group therapies  2. Psychiatric Diagnoses and Treatment:  Will continue sertraline  50 mg for depression and mood. Continue CIWA assessments.        3. Medical Issues  Being Addressed:     4. Discharge Planning:   -- Social work and case management to assist with discharge planning and identification of hospital follow-up needs prior to discharge  -- Estimated LOS: 3-4 days  Daine KATHEE Ober, NP 10/14/2023, 4:10 PM

## 2023-10-14 NOTE — Plan of Care (Signed)
  Problem: Education: Goal: Mental status will improve Outcome: Progressing Goal: Verbalization of understanding the information provided will improve Outcome: Progressing   Problem: Activity: Goal: Sleeping patterns will improve Outcome: Progressing

## 2023-10-14 NOTE — Group Note (Signed)
 Date:  10/14/2023 Time:  9:08 PM  Group Topic/Focus:  Managing Feelings:   The focus of this group is to identify what feelings patients have difficulty handling and develop a plan to handle them in a healthier way upon discharge.    Participation Level:  Active  Participation Quality:  Appropriate and Attentive  Affect:  Appropriate  Cognitive:  Alert and Appropriate  Insight: Appropriate and Good  Engagement in Group:  Engaged and Improving  Modes of Intervention:  Clarification, Discussion, Education, Rapport Building, and Support  Additional Comments:     Levi Mejia 10/14/2023, 9:08 PM

## 2023-10-14 NOTE — Plan of Care (Signed)
  Problem: Education: Goal: Emotional status will improve Outcome: Not Progressing Goal: Mental status will improve Outcome: Not Progressing Goal: Verbalization of understanding the information provided will improve Outcome: Not Progressing   Problem: Health Behavior/Discharge Planning: Goal: Compliance with treatment plan for underlying cause of condition will improve Outcome: Not Progressing   Problem: Physical Regulation: Goal: Ability to maintain clinical measurements within normal limits will improve Outcome: Progressing   Problem: Safety: Goal: Periods of time without injury will increase Outcome: Progressing

## 2023-10-15 NOTE — Plan of Care (Signed)
  Problem: Education: Goal: Emotional status will improve Outcome: Progressing Goal: Mental status will improve Outcome: Progressing Goal: Verbalization of understanding the information provided will improve Outcome: Progressing   Problem: Coping: Goal: Ability to verbalize frustrations and anger appropriately will improve Outcome: Progressing Goal: Ability to demonstrate self-control will improve Outcome: Progressing   Problem: Activity: Goal: Interest or engagement in activities will improve Outcome: Not Progressing Goal: Sleeping patterns will improve Outcome: Not Progressing   Problem: Health Behavior/Discharge Planning: Goal: Compliance with treatment plan for underlying cause of condition will improve Outcome: Not Progressing

## 2023-10-15 NOTE — Group Note (Signed)
 Date:  10/15/2023 Time:  10:42 AM  Group Topic/Focus:  Goals Group:   The focus of this group is to help patients establish daily goals to achieve during treatment and discuss how the patient can incorporate goal setting into their daily lives to aide in recovery.    Participation Level:  Did Not Attend   Levi Mejia 10/15/2023, 10:42 AM

## 2023-10-15 NOTE — Progress Notes (Signed)
   10/15/23 0937  Psych Admission Type (Psych Patients Only)  Admission Status Involuntary  Psychosocial Assessment  Patient Complaints None  Eye Contact Fair  Facial Expression Flat  Affect Flat  Speech Logical/coherent  Interaction Isolative;Minimal  Motor Activity Slow  Appearance/Hygiene Unremarkable  Behavior Characteristics Cooperative  Mood Depressed  Aggressive Behavior  Effect No apparent injury  Thought Process  Coherency WDL  Content WDL  Delusions None reported or observed  Perception WDL  Hallucination None reported or observed  Judgment WDL  Confusion None  Danger to Self  Current suicidal ideation? Denies  Agreement Not to Harm Self Yes  Description of Agreement verbal  Danger to Others  Danger to Others None reported or observed

## 2023-10-15 NOTE — Progress Notes (Signed)
 [facility] MD Progress Note  10/15/2023 2:57 PM Levi Mejia  MRN:  [id number]  History of Present Illness: Levi Mejia is a 53 y.o. male admitted: Presented to the [facility] 10/10/2023  9:47 PM for nausea, vomiting, and evaluation after making suicidal statements in the context of heavy alcohol use. He carries the psychiatric diagnoses of alcohol use disorder and unspecified depressive disorder, and has a past medical history of hypertension and prior alcohol withdrawal admission in July 2025, when he presented with seizures or possible pseudoseizures and had an ethanol level >200. Patient is admitted to adult psych unit with Q15 min safety monitoring. Multidisciplinary team approach is offered. Medication management; group/milieu therapy is offered.    Patient is noted to be resting in bed.  He was very minimizing and superficially engaged in the interview.  He reports living with his wife and kids of age 75, 37.  He reports having history of depression and reports having depression for the last few months.  He reports being unemployed for the last 3 to 4 months and when asked for the reason he initially kept quiet but did acknowledge that because of his addiction to alcohol and multiple absences to work he lost his job.  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  He did acknowledge going through financial burden because of unemployment.  He reports poor appetite and sleep.  He reports low energy and poor motivation.  He denies current SI/HI/intent/plan but did acknowledge that he did send the text messages about wanting to kill himself and end it all to his wife.  He reports that his wife was at home with his and does text messages.  When asked for the reason for the suicidal thoughts or the duration of suicidal thoughts patient remains silent and reports I do not know .  Provider tried multiple interventions to give him space to talk about his suicidal thoughts but patient would  not engage but clearly he became tearful during the discussion and kept saying I do not know .  He denies auditory/visual hallucinations, denies any anxiety or panic attacks, denies current or previous episodes of mania/hypomania, denies nightmares or flashbacks.  He did acknowledge problems with alcohol for many years and he reports drinking vodka half to 2 fifth bottle on daily basis with history of withdrawal seizures but denies any DTs.  10/14/2023: Chart reviewed, case discussed in multidisciplinary meeting, patient seen during rounds. Patient is reassessed today on the inpatient unit. He has been refusing librium  and states that it makes me feel funny and does not think that he needs it. Patient reports that he is sleeping well, denies withdrawal symptoms, and has questions about his discharge. He endorses improved depression and anxiety symptoms at this time. Denies cravings. Reports good sleep and appetite. Denies SI, HI, AVH.   10/15/2023: Chart reviewed, case discussed in multidisciplinary meeting, patient seen during rounds. Patient is reassessed today on the inpatient unit. He continues to deny any psychiatric symptoms, withdrawal symptoms, cravings, or suicidal thoughts. Patient has been minimally engaging in groups and insight continues to be poor regarding his mental  health. He does not believe that he needs ongoing substance abuse treatment. Spoke with his spouse and she does not believe that he is ready to return home and is minimizing his symptoms. Continues to be conflict within the home regarding his presence. Patient is discharge focused.   Sleep: Good  Appetite:  Good  Past Psychiatric History: see h&P  Family History:  Family History  Problem Relation Age of Onset   Coronary artery disease Mother 74       Stent   Cancer Mother        Bone   Hypertension Mother    Heart failure Father    Heart attack Father 81   Hypertension Father    Alcohol abuse Father     Hypertension Brother    Diabetes Neg Hx    Prostate cancer Neg Hx    Colon cancer Neg Hx    Heart disease Neg Hx    Social History:  Social History   Substance and Sexual Activity  Alcohol Use Yes   Comment: 1/5 liquor daily     Social History   Substance and Sexual Activity  Drug Use Not Currently   Types: Marijuana    Social History   Socioeconomic History   Marital status: Married    Spouse name: Not on file   Number of children: 1   Years of education: Not on file   Highest education level: 12th grade  Occupational History   Occupation: Curator    Comment: Engineering geologist  Tobacco Use   Smoking status: Never   Smokeless tobacco: Never  Vaping Use   Vaping status: Former  Substance and Sexual Activity   Alcohol use: Yes    Comment: 1/5 liquor daily   Drug use: Not Currently    Types: Marijuana   Sexual activity: Not Currently  Other Topics Concern   Not on file  Social History Narrative   Divorced   Common law marriage since 2006      1 daughter      Right handed   Social Drivers of Health   Financial Resource Strain: Low Risk  (03/22/2023)   Received from [facility]   Overall Financial Resource Strain (CARDIA)    Difficulty of Paying Living Expenses: Not very hard  Food Insecurity: No Food Insecurity (10/12/2023)   Hunger Vital Sign    Worried About Running Out of Food in the Last Year: Never true    Ran Out of Food in the Last Year: Never true  Transportation Needs: No Transportation Needs (10/12/2023)   PRAPARE - Administrator, Civil Service (Medical): No    Lack of Transportation (Non-Medical): No  Physical Activity: Sufficiently Active (08/08/2022)   Exercise Vital Sign    Days of Exercise per Week: 5 days    Minutes of Exercise per Session: 40 min  Recent Concern: Physical Activity - Inactive (07/14/2022)   Exercise Vital Sign    Days of Exercise per Week: 0 days    Minutes of Exercise per Session: 0 min  Stress: No  Stress Concern Present (08/08/2022)   [employer] of Occupational Health - Occupational Stress Questionnaire    Feeling of Stress : Not at all  Social Connections: Moderately Integrated (10/12/2023)   Social Connection and Isolation Panel    Frequency of Communication with Friends and Family: Once a week    Frequency of Social Gatherings with Friends and Family: Once a week    Attends Religious Services: More than 4 times per year    Active Member of [employer] or Organizations: No    Attends Engineer, structural: More than 4 times per year    Marital Status: Married   Past Medical History:  Past Medical History:  Diagnosis Date   Alcohol abuse    Attention deficit disorder without mention of hyperactivity  HTN (hypertension)    Hyperlipidemia 07/03/2022   ICH (intracerebral hemorrhage) (HCC)    Seizures (HCC)    Stroke [facility])     Past Surgical History:  Procedure Laterality Date   ESOPHAGOGASTRODUODENOSCOPY (EGD) WITH PROPOFOL  N/A 07/04/2022   Procedure: ESOPHAGOGASTRODUODENOSCOPY (EGD) WITH PROPOFOL ;  Surgeon: Unk Corinn Skiff, MD;  Location: [facility] ENDOSCOPY;  Service: Endoscopy;  Laterality: N/A;   Hand tendon reattachment  02/28/1990   Right hand   toe reattachment  02/28/1994   Right 1st toe    Current Medications: Current Facility-Administered Medications  Medication Dose Route Frequency Provider Last Rate Last Admin   acetaminophen  (TYLENOL ) tablet 650 mg  650 mg Oral Q6H PRN Jadapalle, Sree, MD       alum & mag hydroxide-simeth (MAALOX/MYLANTA) 200-200-20 MG/5ML suspension 30 mL  30 mL Oral Q4H PRN Jadapalle, Sree, MD       aspirin  EC tablet 81 mg  81 mg Oral Daily Jadapalle, Sree, MD   81 mg at 10/15/23 0901   chlordiazePOXIDE  (LIBRIUM ) capsule 25 mg  25 mg Oral BH-qamhs Millington, Matthew E, PA-C   25 mg at 10/15/23 0900   Followed by   NOREEN ON 10/16/2023] chlordiazePOXIDE  (LIBRIUM ) capsule 25 mg  25 mg Oral Daily Millington, Matthew E, PA-C        haloperidol  (HALDOL ) tablet 5 mg  5 mg Oral TID PRN Jadapalle, Sree, MD       And   diphenhydrAMINE  (BENADRYL ) capsule 50 mg  50 mg Oral TID PRN Jadapalle, Sree, MD       haloperidol  lactate (HALDOL ) injection 5 mg  5 mg Intramuscular TID PRN Jadapalle, Sree, MD       And   diphenhydrAMINE  (BENADRYL ) injection 50 mg  50 mg Intramuscular TID PRN Jadapalle, Sree, MD       And   LORazepam  (ATIVAN ) injection 2 mg  2 mg Intramuscular TID PRN Jadapalle, Sree, MD       haloperidol  lactate (HALDOL ) injection 10 mg  10 mg Intramuscular TID PRN Jadapalle, Sree, MD       And   diphenhydrAMINE  (BENADRYL ) injection 50 mg  50 mg Intramuscular TID PRN Jadapalle, Sree, MD       And   LORazepam  (ATIVAN ) injection 2 mg  2 mg Intramuscular TID PRN Jadapalle, Sree, MD       folic acid  (FOLVITE ) tablet 1 mg  1 mg Oral Daily Jadapalle, Sree, MD   1 mg at 10/15/23 0901   hydrOXYzine  (ATARAX ) tablet 25 mg  25 mg Oral Q6H PRN Jadapalle, Sree, MD       loperamide  (IMODIUM ) capsule 2-4 mg  2-4 mg Oral PRN Millington, Matthew E, PA-C       magnesium  hydroxide (MILK OF MAGNESIA) suspension 30 mL  30 mL Oral Daily PRN Donnelly Mellow, MD       multivitamin with minerals tablet 1 tablet  1 tablet Oral Daily Jadapalle, Sree, MD   1 tablet at 10/15/23 0900   ondansetron  (ZOFRAN -ODT) disintegrating tablet 4 mg  4 mg Oral Q6H PRN Millington, Matthew E, PA-C       sertraline  (ZOLOFT ) tablet 25 mg  25 mg Oral Daily Jadapalle, Sree, MD   25 mg at 10/15/23 0900   thiamine  (VITAMIN B1) tablet 100 mg  100 mg Oral Daily Jadapalle, Sree, MD   100 mg at 10/15/23 0900   traZODone  (DESYREL ) tablet 50 mg  50 mg Oral QHS PRN Jadapalle, Sree, MD   50 mg at 10/14/23 2149  Lab Results: No results found for this or any previous visit (from the past 48 hours).  Blood Alcohol level:  Lab Results  Component Value Date   ETH 345 (HH) 10/10/2023   ETH 204 (H) 09/18/2023    Metabolic Disorder Labs: Lab Results  Component Value Date    HGBA1C 5.1 06/23/2022   MPG 85.32 04/07/2022   Lab Results  Component Value Date   PROLACTIN 10.5 09/18/2023   Lab Results  Component Value Date   CHOL 228 (H) 06/23/2022   TRIG 74.0 06/23/2022   HDL 95.50 06/23/2022   CHOLHDL 2 06/23/2022   VLDL 14.8 06/23/2022   LDLCALC 118 (H) 06/23/2022   LDLCALC 123 (H) 04/08/2022    Physical Findings: AIMS:  , ,  ,  ,    CIWA:  CIWA-Ar Total: 0 COWS:      Psychiatric Specialty Exam:  Presentation  General Appearance:  Appropriate for Environment  Eye Contact: Good  Speech: Normal Rate  Speech Volume: Normal    Mood and Affect  Mood: Euthymic  Affect: Appropriate   Thought Process  Thought Processes: Linear; Coherent  Descriptions of Associations:Intact  Orientation:Full (Time, Place and Person)  Thought Content:WDL  Hallucinations:Hallucinations: None  Ideas of Reference:None  Suicidal Thoughts:Suicidal Thoughts: No  Homicidal Thoughts:No data recorded   Sensorium  Memory: Immediate Good; Recent Good; Remote Good  Judgment: Good  Insight: Fair   Art therapist  Concentration: Good  Attention Span: Good  Recall: Good  Fund of Knowledge: Good  Language: Good   Psychomotor Activity  Psychomotor Activity: Psychomotor Activity: Normal  Musculoskeletal: Strength & Muscle Tone: within normal limits Gait & Station: normal Assets  Assets: Communication Skills    Physical Exam: Physical Exam Review of Systems  Psychiatric/Behavioral:  Positive for depression and substance abuse.    Blood pressure 110/78, pulse 65, temperature 98 F (36.7 C), resp. rate 18, SpO2 95%. There is no height or weight on file to calculate BMI.  Diagnosis: Principal Problem:   MDD (major depressive disorder), recurrent severe, without psychosis (HCC)   PLAN: Safety and Monitoring:  -- Voluntary admission to inpatient psychiatric unit for safety, stabilization and treatment  -- Daily  contact with patient to assess and evaluate symptoms and progress in treatment  -- Patient's case to be discussed in multi-disciplinary team meeting  -- Observation Level : q15 minute checks  -- Vital signs:  q12 hours  -- Precautions: suicide, elopement, and assault -- Encouraged patient to participate in unit milieu and in scheduled group therapies  2. Psychiatric Diagnoses and Treatment:  Will continue sertraline  50 mg for depression and mood. Continue CIWA assessments.        3. Medical Issues Being Addressed:     4. Discharge Planning:   -- Social work and case management to assist with discharge planning and identification of hospital follow-up needs prior to discharge  -- Estimated LOS: 3-4 days  Daine KATHEE Ober, NP 10/15/2023, 2:57 PM

## 2023-10-15 NOTE — Plan of Care (Signed)
   Problem: Education: Goal: Knowledge of [facility] Education information/materials will improve Outcome: Progressing Goal: Emotional status will improve Outcome: Progressing Goal: Mental status will improve Outcome: Progressing Goal: Verbalization of understanding the information provided will improve Outcome: Progressing

## 2023-10-15 NOTE — Group Note (Signed)
 Date:  10/15/2023 Time:  9:13 PM  Group Topic/Focus:  Wrap-Up Group:   The focus of this group is to help patients review their daily goal of treatment and discuss progress on daily workbooks.    Participation Level:  Active  Participation Quality:  Appropriate and Attentive  Affect:  Appropriate  Cognitive:  Appropriate  Insight: Appropriate and Good  Engagement in Group:  Limited  Modes of Intervention:  Support  Additional Comments:     Kerri Katz 10/15/2023, 9:13 PM

## 2023-10-16 DIAGNOSIS — F332 Major depressive disorder, recurrent severe without psychotic features: Secondary | ICD-10-CM | POA: Diagnosis not present

## 2023-10-16 NOTE — Plan of Care (Signed)
  Problem: Education: Goal: Emotional status will improve Outcome: Progressing Goal: Mental status will improve Outcome: Progressing Goal: Verbalization of understanding the information provided will improve Outcome: Progressing   Problem: Coping: Goal: Ability to verbalize frustrations and anger appropriately will improve Outcome: Progressing Goal: Ability to demonstrate self-control will improve Outcome: Progressing   Problem: Health Behavior/Discharge Planning: Goal: Compliance with treatment plan for underlying cause of condition will improve Outcome: Progressing   Problem: Activity: Goal: Interest or engagement in activities will improve Outcome: Not Progressing Goal: Sleeping patterns will improve Outcome: Not Progressing

## 2023-10-16 NOTE — Progress Notes (Signed)
   10/15/23 2000  Psych Admission Type (Psych Patients Only)  Admission Status Involuntary  Psychosocial Assessment  Patient Complaints None  Eye Contact Brief  Facial Expression Flat  Affect Flat  Speech Logical/coherent  Interaction Guarded  Motor Activity Slow  Appearance/Hygiene Improved  Behavior Characteristics Cooperative;Appropriate to situation;Calm  Aggressive Behavior  Effect No apparent injury  Thought Process  Coherency WDL  Content WDL  Delusions WDL  Hallucination None reported or observed  Judgment WDL  Confusion WDL  Danger to Self  Current suicidal ideation? Denies   Patient is alert and oriented x 4, affect is flat, he was visible in the milieu this evening interacting with peers, his thoughts are organized, affect is congruent with mood. 15 minutes safety checks maintained.

## 2023-10-16 NOTE — Group Note (Signed)
 [facility] LCSW Group Therapy Note    Group Date: 10/16/2023 Start Time: 1300 End Time: 1400  Type of Therapy and Topic:  Group Therapy:  Overcoming Obstacles  Participation Level:  [facility] PARTICIPATION LEVEL: Did Not Attend   Description of Group:   In this group patients will be encouraged to explore what they see as obstacles to their own wellness and recovery. They will be guided to discuss their thoughts, feelings, and behaviors related to these obstacles. The group will process together ways to cope with barriers, with attention given to specific choices patients can make. Each patient will be challenged to identify changes they are motivated to make in order to overcome their obstacles. This group will be process-oriented, with patients participating in exploration of their own experiences as well as giving and receiving support and challenge from other group members.  Therapeutic Goals: 1. Patient will identify personal and current obstacles as they relate to admission. 2. Patient will identify barriers that currently interfere with their wellness or overcoming obstacles.  3. Patient will identify feelings, thought process and behaviors related to these barriers. 4. Patient will identify two changes they are willing to make to overcome these obstacles:    Summary of Patient Progress X   Therapeutic Modalities:   Cognitive Behavioral Therapy Solution Focused Therapy Motivational Interviewing Relapse Prevention Therapy   Nadara JONELLE Fam, LCSW

## 2023-10-16 NOTE — Group Note (Signed)
 Date:  10/16/2023 Time:  10:32 AM  Group Topic/Focus:  Goals Group:   The focus of this group is to help patients establish daily goals to achieve during treatment and discuss how the patient can incorporate goal setting into their daily lives to aide in recovery.    Participation Level:  Active  Participation Quality:  Appropriate  Affect:  Appropriate  Cognitive:  Alert  Insight: Appropriate  Engagement in Group:  Engaged  Modes of Intervention:  Activity, Discussion, and Education  Additional Comments:    Skippy LITTIE Bennett 10/16/2023, 10:32 AM

## 2023-10-16 NOTE — Progress Notes (Signed)
   10/16/23 0951  Psych Admission Type (Psych Patients Only)  Admission Status Involuntary  Psychosocial Assessment  Patient Complaints None  Eye Contact Fair  Facial Expression Flat  Affect Flat  Speech Logical/coherent  Interaction Isolative;Minimal  Motor Activity Slow  Appearance/Hygiene Unremarkable  Behavior Characteristics Cooperative;Guarded  Mood Pleasant  Aggressive Behavior  Effect No apparent injury  Thought Process  Coherency WDL  Content WDL  Delusions None reported or observed  Perception WDL  Hallucination None reported or observed  Judgment WDL  Confusion None  Danger to Self  Current suicidal ideation? Denies  Agreement Not to Harm Self Yes  Description of Agreement verbal  Danger to Others  Danger to Others None reported or observed

## 2023-10-16 NOTE — Group Note (Signed)
 Recreation Therapy Group Note   Group Topic:Coping Skills  Group Date: 10/16/2023 Start Time: 1035 End Time: 1140 Facilitators: Celestia Jeoffrey BRAVO, LRT, CTRS Location: Craft Room  Group Description: Leisure. Patients were given the option to choose from singing karaoke, coloring mandalas, using oil pastels, journaling, painting or playing with play-doh. LRT and pts discussed the meaning of leisure, the importance of participating in leisure during their free time/when they're outside of the hospital, as well as how our leisure interests can also serve as coping skills.   Goal Area(s) Addressed:  Patient will identify a current leisure interest.  Patient will learn the definition of "leisure". Patient will practice making a positive decision. Patient will have the opportunity to try a new leisure activity. Patient will communicate with peers and LRT.    Affect/Mood: N/A   Participation Level: Did not attend    Clinical Observations/Individualized Feedback: Patient did not attend group.   Plan: Continue to engage patient in RT group sessions 2-3x/week.   Jeoffrey BRAVO Celestia, LRT, CTRS 10/16/2023 2:22 PM

## 2023-10-16 NOTE — Plan of Care (Signed)
   Problem: Education: Goal: Knowledge of [facility] Education information/materials will improve Outcome: Progressing Goal: Emotional status will improve Outcome: Progressing Goal: Mental status will improve Outcome: Progressing

## 2023-10-16 NOTE — Group Note (Signed)
 Date:  10/16/2023 Time:  4:55 PM  Group Topic/Focus:  Healthy Communication:   The focus of this group is to discuss communication, barriers to communication, as well as healthy ways to communicate with others.    Participation Level:  Active  Participation Quality:  Appropriate  Affect:  Appropriate  Cognitive:  Alert  Insight: Appropriate  Engagement in Group:  Engaged  Modes of Intervention:  Activity, Discussion, and Education  Additional Comments:    Skippy LITTIE Bennett 10/16/2023, 4:55 PM

## 2023-10-16 NOTE — Progress Notes (Signed)
 [facility] MD Progress Note  10/16/2023 4:17 PM Levi Mejia  MRN:  [id number]  History of Present Illness: Levi Mejia is a 53 y.o. male admitted: Presented to the [facility] 10/10/2023  9:47 PM for nausea, vomiting, and evaluation after making suicidal statements in the context of heavy alcohol use. He carries the psychiatric diagnoses of alcohol use disorder and unspecified depressive disorder, and has a past medical history of hypertension and prior alcohol withdrawal admission in July 2025, when he presented with seizures or possible pseudoseizures and had an ethanol level >200. Patient is admitted to adult psych unit with Q15 min safety monitoring. Multidisciplinary team approach is offered. Medication management; group/milieu therapy is offered.   Subject to: Chart is reviewed and discussed the case in the treatment team.  Patient is noted to be resting in his bed.  He was discharged focused and reports that the weekend doctor told him that he will be discharged today.  Patient denies SI/HI/plan and reports that he is taking Zoloft  with no reported side effects.  He reports that he discussed his discharge planning with his wife.  He is willing to participate in outpatient substance use and mental health services.  Per nursing and social work team patient wife wants reached out on discharge planning and she has no safety concerns at this time.  Discharge will be set for 10/17/2023 Sleep: Good  Appetite:  Good  Past Psychiatric History: see h&P Family History:  Family History  Problem Relation Age of Onset   Coronary artery disease Mother 71       Stent   Cancer Mother        Bone   Hypertension Mother    Heart failure Father    Heart attack Father 15   Hypertension Father    Alcohol abuse Father    Hypertension Brother    Diabetes Neg Hx    Prostate cancer Neg Hx    Colon cancer Neg Hx    Heart disease Neg Hx    Social History:  Social History   Substance and Sexual  Activity  Alcohol Use Yes   Comment: 1/5 liquor daily     Social History   Substance and Sexual Activity  Drug Use Not Currently   Types: Marijuana    Social History   Socioeconomic History   Marital status: Married    Spouse name: Not on file   Number of children: 1   Years of education: Not on file   Highest education level: 12th grade  Occupational History   Occupation: Curator    Comment: Engineering geologist  Tobacco Use   Smoking status: Never   Smokeless tobacco: Never  Vaping Use   Vaping status: Former  Substance and Sexual Activity   Alcohol use: Yes    Comment: 1/5 liquor daily   Drug use: Not Currently    Types: Marijuana   Sexual activity: Not Currently  Other Topics Concern   Not on file  Social History Narrative   Divorced   Common law marriage since 2006      1 daughter      Right handed   Social Drivers of Health   Financial Resource Strain: Low Risk  (03/22/2023)   Received from [facility]   Overall Financial Resource Strain (CARDIA)    Difficulty of Paying Living Expenses: Not very hard  Food Insecurity: No Food Insecurity (10/12/2023)   Hunger Vital Sign    Worried About Running Out of Food  in the Last Year: Never true    Ran Out of Food in the Last Year: Never true  Transportation Needs: No Transportation Needs (10/12/2023)   PRAPARE - Administrator, Civil Service (Medical): No    Lack of Transportation (Non-Medical): No  Physical Activity: Sufficiently Active (08/08/2022)   Exercise Vital Sign    Days of Exercise per Week: 5 days    Minutes of Exercise per Session: 40 min  Recent Concern: Physical Activity - Inactive (07/14/2022)   Exercise Vital Sign    Days of Exercise per Week: 0 days    Minutes of Exercise per Session: 0 min  Stress: No Stress Concern Present (08/08/2022)   [employer] of Occupational Health - Occupational Stress Questionnaire    Feeling of Stress : Not at all  Social Connections: Moderately  Integrated (10/12/2023)   Social Connection and Isolation Panel    Frequency of Communication with Friends and Family: Once a week    Frequency of Social Gatherings with Friends and Family: Once a week    Attends Religious Services: More than 4 times per year    Active Member of Clubs or Organizations: No    Attends Engineer, structural: More than 4 times per year    Marital Status: Married   Past Medical History:  Past Medical History:  Diagnosis Date   Alcohol abuse    Attention deficit disorder without mention of hyperactivity    HTN (hypertension)    Hyperlipidemia 07/03/2022   ICH (intracerebral hemorrhage) (HCC)    Seizures (HCC)    Stroke [facility])     Past Surgical History:  Procedure Laterality Date   ESOPHAGOGASTRODUODENOSCOPY (EGD) WITH PROPOFOL  N/A 07/04/2022   Procedure: ESOPHAGOGASTRODUODENOSCOPY (EGD) WITH PROPOFOL ;  Surgeon: Unk Corinn Skiff, MD;  Location: [facility] ENDOSCOPY;  Service: Endoscopy;  Laterality: N/A;   Hand tendon reattachment  02/28/1990   Right hand   toe reattachment  02/28/1994   Right 1st toe    Current Medications: Current Facility-Administered Medications  Medication Dose Route Frequency Provider Last Rate Last Admin   acetaminophen  (TYLENOL ) tablet 650 mg  650 mg Oral Q6H PRN Deisi Salonga, MD       alum & mag hydroxide-simeth (MAALOX/MYLANTA) 200-200-20 MG/5ML suspension 30 mL  30 mL Oral Q4H PRN Braley Luckenbaugh, MD       aspirin  EC tablet 81 mg  81 mg Oral Daily Levon Boettcher, MD   81 mg at 10/16/23 9147   haloperidol  (HALDOL ) tablet 5 mg  5 mg Oral TID PRN Abrey Bradway, MD       And   diphenhydrAMINE  (BENADRYL ) capsule 50 mg  50 mg Oral TID PRN Tan Clopper, MD       haloperidol  lactate (HALDOL ) injection 5 mg  5 mg Intramuscular TID PRN Dondrell Loudermilk, MD       And   diphenhydrAMINE  (BENADRYL ) injection 50 mg  50 mg Intramuscular TID PRN Eldrick Penick, MD       And   LORazepam  (ATIVAN ) injection 2 mg  2 mg  Intramuscular TID PRN Geriann Lafont, MD       haloperidol  lactate (HALDOL ) injection 10 mg  10 mg Intramuscular TID PRN Kei Langhorst, MD       And   diphenhydrAMINE  (BENADRYL ) injection 50 mg  50 mg Intramuscular TID PRN Ines Warf, MD       And   LORazepam  (ATIVAN ) injection 2 mg  2 mg Intramuscular TID PRN Donnelly Mellow, MD  folic acid  (FOLVITE ) tablet 1 mg  1 mg Oral Daily Oracio Galen, MD   1 mg at 10/16/23 9147   hydrOXYzine  (ATARAX ) tablet 25 mg  25 mg Oral Q6H PRN Cletis Muma, MD   25 mg at 10/15/23 2128   magnesium  hydroxide (MILK OF MAGNESIA) suspension 30 mL  30 mL Oral Daily PRN Lizzett Nobile, MD       multivitamin with minerals tablet 1 tablet  1 tablet Oral Daily Moni Rothrock, MD   1 tablet at 10/15/23 0900   sertraline  (ZOLOFT ) tablet 25 mg  25 mg Oral Daily Patti Shorb, MD   25 mg at 10/16/23 9147   thiamine  (VITAMIN B1) tablet 100 mg  100 mg Oral Daily Kimbery Harwood, MD   100 mg at 10/16/23 9147   traZODone  (DESYREL ) tablet 50 mg  50 mg Oral QHS PRN Sidhant Helderman, MD   50 mg at 10/14/23 2149    Lab Results: No results found for this or any previous visit (from the past 48 hours).  Blood Alcohol level:  Lab Results  Component Value Date   ETH 345 (HH) 10/10/2023   ETH 204 (H) 09/18/2023    Metabolic Disorder Labs: Lab Results  Component Value Date   HGBA1C 5.1 06/23/2022   MPG 85.32 04/07/2022   Lab Results  Component Value Date   PROLACTIN 10.5 09/18/2023   Lab Results  Component Value Date   CHOL 228 (H) 06/23/2022   TRIG 74.0 06/23/2022   HDL 95.50 06/23/2022   CHOLHDL 2 06/23/2022   VLDL 14.8 06/23/2022   LDLCALC 118 (H) 06/23/2022   LDLCALC 123 (H) 04/08/2022    Physical Findings: AIMS:  , ,  ,  ,    CIWA:  CIWA-Ar Total: 0 COWS:      Psychiatric Specialty Exam:  Presentation  General Appearance:  Appropriate for Environment  Eye Contact: Good  Speech: Normal Rate  Speech  Volume: Normal    Mood and Affect  Mood: Euthymic  Affect: Appropriate   Thought Process  Thought Processes: Linear; Coherent  Descriptions of Associations:Intact  Orientation:Full (Time, Place and Person)  Thought Content:WDL  Hallucinations: Denies  Ideas of Reference:None  Suicidal Thoughts: Denies  Homicidal Thoughts: Denies   Sensorium  Memory: Immediate Good; Recent Good; Remote Good  Judgment: Good  Insight: Fair   Art therapist  Concentration: Good  Attention Span: Good  Recall: Good  Fund of Knowledge: Good  Language: Good   Psychomotor Activity  Psychomotor Activity: No data recorded  Musculoskeletal: Strength & Muscle Tone: within normal limits Gait & Station: normal Assets  Assets: Communication Skills    Physical Exam: Physical Exam Vitals and nursing note reviewed.    Review of Systems  Psychiatric/Behavioral:  Positive for depression and substance abuse.    Blood pressure 111/83, pulse 78, temperature 97.8 F (36.6 C), resp. rate 18, SpO2 100%. There is no height or weight on file to calculate BMI.  Diagnosis: Principal Problem:   MDD (major depressive disorder), recurrent severe, without psychosis (HCC)   PLAN: Safety and Monitoring:  -- Voluntary admission to inpatient psychiatric unit for safety, stabilization and treatment  -- Daily contact with patient to assess and evaluate symptoms and progress in treatment  -- Patient's case to be discussed in multi-disciplinary team meeting  -- Observation Level : q15 minute checks  -- Vital signs:  q12 hours  -- Precautions: suicide, elopement, and assault -- Encouraged patient to participate in unit milieu and in scheduled group therapies  2. Psychiatric Diagnoses and Treatment:  Will continue sertraline  50 mg for depression and mood. Continue CIWA assessments.        3. Medical Issues Being Addressed:     4. Discharge Planning:   -- Social work  and case management to assist with discharge planning and identification of hospital follow-up needs prior to discharge  -- Estimated LOS: 3-4 days  Quynh Basso, MD 10/16/2023, 4:17 PM

## 2023-10-17 ENCOUNTER — Ambulatory Visit: Payer: Self-pay | Admitting: Nurse Practitioner

## 2023-10-17 DIAGNOSIS — F332 Major depressive disorder, recurrent severe without psychotic features: Secondary | ICD-10-CM | POA: Diagnosis not present

## 2023-10-17 MED ORDER — ADULT MULTIVITAMIN W/MINERALS CH: 1.0000 | ORAL_TABLET | Freq: Every day | ORAL | 0 refills | Status: AC

## 2023-10-17 MED ORDER — THIAMINE HCL 100 MG PO TABS: 100.0000 mg | ORAL_TABLET | Freq: Every day | ORAL | 0 refills | Status: AC

## 2023-10-17 MED ORDER — SERTRALINE HCL 50 MG PO TABS: 50.0000 mg | ORAL_TABLET | Freq: Every day | ORAL | 0 refills | Status: DC

## 2023-10-17 MED ORDER — FOLIC ACID 1 MG PO TABS: 1.0000 mg | ORAL_TABLET | Freq: Every day | ORAL | 0 refills | Status: AC

## 2023-10-17 NOTE — Progress Notes (Signed)
 Discharge Note:  Patient denies SI/HI/AVH at this time. Discharge instructions, AVS, prescriptions, and transition record gone over with patient. Patient agrees to comply with medication management, follow-up visit, and outpatient therapy. Patient belongings returned to patient. Patient questions and concerns addressed and answered. Patient ambulatory off unit. Will D/C via Taxi

## 2023-10-17 NOTE — Progress Notes (Signed)
 Tour of Duty:  Levi KATHEE Flemings, RN, 10/17/23, Tour of Duty: 0700-1900  SI/HI/AVH: Denies  Self-Reported   Mood: Positive  Anxiety: Denies Depression: Denies Irritability: Denies  Broset  Violence Prevention Guidelines *See Row Information*: Small Violence Risk interventions implemented   LBM  Last BM Date : 10/17/23   Pain: not present  Patient Refusals (including Rx): No  Shift Summary:  Patient observed to be calm on unit. Patient able to make needs known. Patient observed to engage appropriately with staff and peers. Patient taking medications as prescribed. This shift, no PRN medication requested or required. No observed or reported side effects to medication. No observed or reported agitation, aggression, or other acute emotional distress. No observed or reported physical abnormalities or concerns.   Last Vitals   Vitals Weight: (not recorded) Temp: 97.9 F (36.6 C) Temp Source: Oral Pulse Rate: 64 Resp: 12 BP: 111/70 Patient Position: (not recorded)  Admission Type  Psych Admission Type (Psych Patients Only) Admission Status: Involuntary Date 72 hour document signed : (not recorded) Time 72 hour document signed : (not recorded) Provider Notified (First and Last Name) (see details for LINK to note): (not recorded)   Psychosocial Assessment  Psychosocial Assessment Patient Complaints: None Eye Contact: Fair Facial Expression: Flat Affect: Flat Speech: Logical/coherent Interaction: Minimal Motor Activity: Other (Comment) (WNL) Appearance/Hygiene: Unremarkable Behavior Characteristics: Cooperative Mood: Pleasant   Aggressive Behavior  Targets: (not recorded)   Thought Process  Thought Process Coherency: Within Defined Limits Content: Within Defined Limits Delusions: None reported or observed Perception: Within Defined Limits Hallucination: None reported or observed Judgment: Within Defined Limits Confusion: None  Danger to  Self/Others  Danger to Self Current suicidal ideation?: Denies Description of Suicide Plan: (not recorded) Self-Injurious Behavior: (not recorded) Agreement Not to Harm Self: Yes Description of Agreement: verbal Danger to Others: None reported or observed

## 2023-10-17 NOTE — Progress Notes (Signed)
  [facility] Adult Case Management Discharge Plan :  Will you be returning to the same living situation after discharge:  Yes,  Patient to return home.  At discharge, do you have transportation home?: Yes,  CSW to arrange transportation patient's behalf.  Do you have the ability to pay for your medications: Yes,  [facility]  Release of information consent forms completed and in the chart;  Patient's signature needed at discharge.  Patient to Follow up at:  Follow-up Information     [facility] Follow up.   Why: In person assessment for therapy and medication management is 10/23/23 at 11 AM. Contact information: [address] [place] [address] [phone]                 Next level of care provider has access to [facility] Link:no  Safety Planning and Suicide Prevention discussed: Yes,  SPE completed with patient. Patient Refusal for Family/Significant Other Suicide Prevention Education: The patient Levi Mejia has refused to provide written consent for family/significant other to be provided Family/Significant Other Suicide Prevention Education during admission and/or prior to discharge.  Physician notified.   SPE completed with pt, as pt refused to consent to family contact. SPI pamphlet provided to pt and pt was encouraged to share information with support network, ask questions, and talk about any concerns relating to SPE. Pt denies access to guns/firearms and verbalized understanding of information provided. Mobile Crisis information also provided to pt.    Has patient been referred to the Quitline?: Patient does not use tobacco/nicotine products  Patient has been referred for addiction treatment: Yes, the patient will follow up with an outpatient provider for substance use disorder. Psychiatrist/APP: appointment made and Therapist: appointment made  Alveta CHRISTELLA Kerns, LCSW 10/17/2023, 8:53 AM

## 2023-10-17 NOTE — Discharge Summary (Signed)
 Physician Discharge Summary Note  Patient:  Levi Mejia is an 53 y.o., male MRN:  [id number] DOB:  11-02-1970 Patient phone:  [phone] (home)  Patient address:   [address] [place] [address],   Total time spent: 40 min Date of Admission:  10/12/2023 Date of Discharge: 10/17/23  Reason for Admission:  Levi Mejia is a 53 y.o. male admitted: Presented to the [facility] 10/10/2023 9:47 PM for nausea, vomiting, and evaluation after making suicidal statements in the context of heavy alcohol use. He carries the psychiatric diagnoses of alcohol use disorder and unspecified depressive disorder, and has a past medical history of hypertension and prior alcohol withdrawal admission in July 2025, when he presented with seizures or possible pseudoseizures and had an ethanol level >200. Patient is admitted to adult psych unit with Q15 min safety monitoring. Multidisciplinary team approach is offered. Medication management; group/milieu therapy is offered.   Principal Problem: MDD (major depressive disorder), recurrent severe, without psychosis (HCC) Discharge Diagnoses: Principal Problem:   MDD (major depressive disorder), recurrent severe, without psychosis (HCC)   Past Psychiatric History: see h&p  Family Psychiatric  History: see h&p Social History:  Social History   Substance and Sexual Activity  Alcohol Use Yes   Comment: 1/5 liquor daily     Social History   Substance and Sexual Activity  Drug Use Not Currently   Types: Marijuana    Social History   Socioeconomic History   Marital status: Married    Spouse name: Not on file   Number of children: 1   Years of education: Not on file   Highest education level: 12th grade  Occupational History   Occupation: Curator    Comment: Engineering geologist  Tobacco Use   Smoking status: Never   Smokeless tobacco: Never  Vaping Use   Vaping status: Former  Substance and Sexual Activity   Alcohol use: Yes     Comment: 1/5 liquor daily   Drug use: Not Currently    Types: Marijuana   Sexual activity: Not Currently  Other Topics Concern   Not on file  Social History Narrative   Divorced   Common law marriage since 2006      1 daughter      Right handed   Social Drivers of Health   Financial Resource Strain: Low Risk  (03/22/2023)   Received from [facility]   Overall Financial Resource Strain (CARDIA)    Difficulty of Paying Living Expenses: Not very hard  Food Insecurity: No Food Insecurity (10/12/2023)   Hunger Vital Sign    Worried About Running Out of Food in the Last Year: Never true    Ran Out of Food in the Last Year: Never true  Transportation Needs: No Transportation Needs (10/12/2023)   PRAPARE - Administrator, Civil Service (Medical): No    Lack of Transportation (Non-Medical): No  Physical Activity: Sufficiently Active (08/08/2022)   Exercise Vital Sign    Days of Exercise per Week: 5 days    Minutes of Exercise per Session: 40 min  Recent Concern: Physical Activity - Inactive (07/14/2022)   Exercise Vital Sign    Days of Exercise per Week: 0 days    Minutes of Exercise per Session: 0 min  Stress: No Stress Concern Present (08/08/2022)   [employer] of Occupational Health - Occupational Stress Questionnaire    Feeling of Stress : Not at all  Social Connections: Moderately Integrated (10/12/2023)   Social Connection  and Isolation Panel    Frequency of Communication with Friends and Family: Once a week    Frequency of Social Gatherings with Friends and Family: Once a week    Attends Religious Services: More than 4 times per year    Active Member of Clubs or Organizations: No    Attends Engineer, structural: More than 4 times per year    Marital Status: Married   Past Medical History:  Past Medical History:  Diagnosis Date   Alcohol abuse    Attention deficit disorder without mention of hyperactivity    HTN (hypertension)     Hyperlipidemia 07/03/2022   ICH (intracerebral hemorrhage) (HCC)    Seizures (HCC)    Stroke [facility])     Past Surgical History:  Procedure Laterality Date   ESOPHAGOGASTRODUODENOSCOPY (EGD) WITH PROPOFOL  N/A 07/04/2022   Procedure: ESOPHAGOGASTRODUODENOSCOPY (EGD) WITH PROPOFOL ;  Surgeon: Unk Corinn Skiff, MD;  Location: [facility] ENDOSCOPY;  Service: Endoscopy;  Laterality: N/A;   Hand tendon reattachment  02/28/1990   Right hand   toe reattachment  02/28/1994   Right 1st toe   Family History:  Family History  Problem Relation Age of Onset   Coronary artery disease Mother 35       Stent   Cancer Mother        Bone   Hypertension Mother    Heart failure Father    Heart attack Father 48   Hypertension Father    Alcohol abuse Father    Hypertension Brother    Diabetes Neg Hx    Prostate cancer Neg Hx    Colon cancer Neg Hx    Heart disease Neg Hx     Hospital Course:  Levi Mejia is a 53 y.o. male admitted: Presented to the EDfor 10/10/2023 9:47 PM for nausea, vomiting, and evaluation after making suicidal statements in the context of heavy alcohol use. He carries the psychiatric diagnoses of alcohol use disorder and unspecified depressive disorder, and has a past medical history of hypertension and prior alcohol withdrawal admission in July 2025, when he presented with seizures or possible pseudoseizures and had an ethanol level >200. Patient is admitted to adult psych unit with Q15 min safety monitoring. Multidisciplinary team approach is offered. Medication management; group/milieu therapy is offered.  Detailed risk assessment is complete based on clinical exam and individual risk factors and acute suicide risk is low and acute violence risk is low.   Admission patient was maintained on CIWA protocol and he had no complications with alcohol detox.  He was initiated on Zoloft  25 mg to help with underlying anxiety.  Patient maintained the medications with no reported side  effects.  On the day of discharge she denies consistently SI/HI/plan and denied hallucinations.  Patient maintained safe behaviors throughout the admission and remains future oriented and is willing to participate in outpatient mental health resources. Currently, all modifiable risk of harm to self/harm to others have been addressed and patient is no longer appropriate for the acute inpatient setting and is able to continue treatment for mental health needs in the community with the supports as indicated below.  Patient is educated and verbalized understanding of discharge plan of care including medications, follow-up appointments, mental health resources and further crisis services in the community.  He is instructed to call 911 or present to the nearest emergency room should he experience any decompensation in mood, disturbance of bowel or return of suicidal/homicidal ideations.  Patient verbalizes understanding of this education and  agrees to this plan of care  Physical Findings: AIMS:  , ,  ,  ,    CIWA:  CIWA-Ar Total: 0 COWS:        Psychiatric Specialty Exam:  Presentation  General Appearance:  Appropriate for Environment; Casual  Eye Contact: Fair  Speech: Clear and Coherent  Speech Volume: Normal    Mood and Affect  Mood: Euthymic  Affect: Appropriate   Thought Process  Thought Processes: Linear  Descriptions of Associations:Intact  Orientation:Full (Time, Place and Person)  Thought Content:Logical  Hallucinations:Hallucinations: None  Ideas of Reference:None  Suicidal Thoughts:Suicidal Thoughts: No  Homicidal Thoughts:Homicidal Thoughts: No   Sensorium  Memory: Immediate Fair; Recent Fair; Remote Fair  Judgment: Fair  Insight: Fair   Art therapist  Concentration: Fair  Attention Span: Fair  Recall: [employer] of Knowledge: Fair  Language: Fair   Psychomotor Activity  Psychomotor Activity: Psychomotor Activity:  Normal  Musculoskeletal: Strength & Muscle Tone: within normal limits Gait & Station: normal Assets  Assets: Manufacturing systems engineer; Desire for Improvement; Resilience; Social Support   Sleep  Sleep: Sleep: Fair    Physical Exam: Physical Exam Vitals and nursing note reviewed.    ROS Blood pressure 111/70, pulse 64, temperature 97.9 F (36.6 C), resp. rate 12, SpO2 96%. There is no height or weight on file to calculate BMI.   Social History   Tobacco Use  Smoking Status Never  Smokeless Tobacco Never   Tobacco Cessation:  N/A, patient does not currently use tobacco products   Blood Alcohol level:  Lab Results  Component Value Date   ETH 345 (HH) 10/10/2023   ETH 204 (H) 09/18/2023    Metabolic Disorder Labs:  Lab Results  Component Value Date   HGBA1C 5.1 06/23/2022   MPG 85.32 04/07/2022   Lab Results  Component Value Date   PROLACTIN 10.5 09/18/2023   Lab Results  Component Value Date   CHOL 228 (H) 06/23/2022   TRIG 74.0 06/23/2022   HDL 95.50 06/23/2022   CHOLHDL 2 06/23/2022   VLDL 14.8 06/23/2022   LDLCALC 118 (H) 06/23/2022   LDLCALC 123 (H) 04/08/2022    See Psychiatric Specialty Exam and Suicide Risk Assessment completed by Attending Physician prior to discharge.  Discharge destination:  Home  Is patient on multiple antipsychotic therapies at discharge:  No   Has Patient had three or more failed trials of antipsychotic monotherapy by history:  No  Recommended Plan for Multiple Antipsychotic Therapies: NA  Discharge Instructions     Diet - low sodium heart healthy   Complete by: As directed    Increase activity slowly   Complete by: As directed       Allergies as of 10/17/2023   No Known Allergies      Medication List     STOP taking these medications    aspirin  EC 81 MG tablet       TAKE these medications      Indication  folic acid  1 MG tablet Commonly known as: FOLVITE  Take 1 tablet (1 mg total) by mouth  daily.  Indication: Anemia From Inadequate Folic Acid    multivitamin with minerals Tabs tablet Take 1 tablet by mouth daily.  Indication: alcohol use   sertraline  50 MG tablet Commonly known as: ZOLOFT  Take 1 tablet (50 mg total) by mouth daily. Start taking on: October 18, 2023  Indication: Major Depressive Disorder   thiamine  100 MG tablet Commonly known as: VITAMIN B1 Take 1 tablet (100 mg  total) by mouth daily.  Indication: Deficiency of Vitamin B1        Follow-up Information     [facility] Follow up.   Why: In person assessment for therapy and medication management is 10/23/23 at 11 AM. Contact information: [address] [place] [address] [phone]                 Follow-up recommendations:  Activity:  As tolearted    Signed: Allyn Foil, MD 10/17/2023, 9:34 AM

## 2023-10-17 NOTE — Plan of Care (Signed)
  Problem: Education: Goal: Mental status will improve Outcome: Progressing   Problem: Coping: Goal: Ability to verbalize frustrations and anger appropriately will improve Outcome: Progressing   Problem: Safety: Goal: Periods of time without injury will increase Outcome: Progressing

## 2023-10-17 NOTE — BHH Suicide Risk Assessment (Signed)
 [facility] Discharge Suicide Risk Assessment   Principal Problem: MDD (major depressive disorder), recurrent severe, without psychosis (HCC) Discharge Diagnoses: Principal Problem:   MDD (major depressive disorder), recurrent severe, without psychosis (HCC)   Total Time spent with patient: 30 minutes  Musculoskeletal: Strength & Muscle Tone: within normal limits Gait & Station: normal Patient leans: N/A  Psychiatric Specialty Exam  Presentation  General Appearance:  Appropriate for Environment; Casual  Eye Contact: Fair  Speech: Clear and Coherent  Speech Volume: Normal  Handedness: Right   Mood and Affect  Mood: Euthymic  Duration of Depression Symptoms: Greater than two weeks  Affect: Appropriate   Thought Process  Thought Processes: Linear  Descriptions of Associations:Intact  Orientation:Full (Time, Place and Person)  Thought Content:Logical  History of Schizophrenia/Schizoaffective disorder:No  Duration of Psychotic Symptoms:No data recorded Hallucinations:Hallucinations: None  Ideas of Reference:None  Suicidal Thoughts:Suicidal Thoughts: No  Homicidal Thoughts:Homicidal Thoughts: No   Sensorium  Memory: Immediate Fair; Recent Fair; Remote Fair  Judgment: Fair  Insight: Fair   Art therapist  Concentration: Fair  Attention Span: Fair  Recall: [employer] of Knowledge: Fair  Language: Fair   Psychomotor Activity  Psychomotor Activity: Psychomotor Activity: Normal   Assets  Assets: Communication Skills; Desire for Improvement; Resilience; Social Support   Sleep  Sleep: Sleep: Fair  Estimated Sleeping Duration (Last 24 Hours): 7.25-8.75 hours  Physical Exam: Physical Exam ROS Blood pressure 111/70, pulse 64, temperature 97.9 F (36.6 C), resp. rate 12, SpO2 96%. There is no height or weight on file to calculate BMI.  Mental Status Per Nursing Assessment::   On Admission:  Suicidal ideation indicated  by others (Collateral from family)  Demographic Factors:  Male  Loss Factors: Decrease in vocational status  Historical Factors: Impulsivity  Risk Reduction Factors:   Living with another person, especially a relative, Positive social support, Positive therapeutic relationship, and Positive coping skills or problem solving skills  Continued Clinical Symptoms:  Depression:   Comorbid alcohol abuse/dependence  Cognitive Features That Contribute To Risk:  None    Suicide Risk:  Minimal: No identifiable suicidal ideation.  Patients presenting with no risk factors but with morbid ruminations; may be classified as minimal risk based on the severity of the depressive symptoms   Follow-up Information     [facility] Follow up.   Why: In person assessment for therapy and medication management is 10/23/23 at 11 AM. Contact information: [address] [place] [address] [phone]                 Plan Of Care/Follow-up recommendations:  Activity:  As tolerated  Allyn Foil, MD 10/17/2023, 9:34 AM

## 2023-10-17 NOTE — Progress Notes (Signed)
   10/16/23 2200  Psych Admission Type (Psych Patients Only)  Admission Status Involuntary  Psychosocial Assessment  Patient Complaints None  Eye Contact Fair  Facial Expression Flat  Affect Flat  Speech Logical/coherent  Interaction Isolative;Minimal  Motor Activity Tremors  Appearance/Hygiene Unremarkable  Behavior Characteristics Cooperative;Guarded  Mood Pleasant  Aggressive Behavior  Effect No apparent injury  Thought Process  Coherency WDL  Content WDL  Delusions None reported or observed  Perception WDL  Hallucination None reported or observed  Judgment WDL  Confusion None  Danger to Self  Current suicidal ideation? Denies  Agreement Not to Harm Self Yes  Description of Agreement verbal  Danger to Others  Danger to Others None reported or observed

## 2023-10-17 NOTE — Plan of Care (Signed)
   Problem: Education: Goal: Knowledge of [facility] Education information/materials will improve Outcome: Progressing Goal: Emotional status will improve Outcome: Progressing Goal: Mental status will improve Outcome: Progressing Goal: Verbalization of understanding the information provided will improve Outcome: Progressing

## 2023-11-19 ENCOUNTER — Telehealth: Payer: Self-pay

## 2023-11-30 ENCOUNTER — Encounter: Payer: Self-pay | Admitting: Emergency Medicine

## 2023-11-30 ENCOUNTER — Other Ambulatory Visit: Payer: Self-pay

## 2023-11-30 ENCOUNTER — Emergency Department
Admission: EM | Admit: 2023-11-30 | Discharge: 2023-11-30 | Disposition: A | Discharge status: Home / Self Care | Payer: Self-pay | Attending: Emergency Medicine | Admitting: Emergency Medicine

## 2023-11-30 DIAGNOSIS — E860 Dehydration: Secondary | ICD-10-CM | POA: Insufficient documentation

## 2023-11-30 DIAGNOSIS — I10 Essential (primary) hypertension: Secondary | ICD-10-CM | POA: Insufficient documentation

## 2023-11-30 DIAGNOSIS — R112 Nausea with vomiting, unspecified: Secondary | ICD-10-CM

## 2023-11-30 LAB — CBC WITH DIFFERENTIAL/PLATELET
Abs Immature Granulocytes: 0.03 K/uL (ref 0.00–0.07)
Basophils Absolute: 0.2 K/uL — ABNORMAL HIGH (ref 0.0–0.1)
Basophils Relative: 2 %
Eosinophils Absolute: 0.1 K/uL (ref 0.0–0.5)
Eosinophils Relative: 1 %
HCT: 44.4 % (ref 39.0–52.0)
Hemoglobin: 15.8 g/dL (ref 13.0–17.0)
Immature Granulocytes: 0 %
Lymphocytes Relative: 20 %
Lymphs Abs: 2.2 K/uL (ref 0.7–4.0)
MCH: 33.5 pg (ref 26.0–34.0)
MCHC: 35.6 g/dL (ref 30.0–36.0)
MCV: 94.1 fL (ref 80.0–100.0)
Monocytes Absolute: 0.9 K/uL (ref 0.1–1.0)
Monocytes Relative: 8 %
Neutro Abs: 7.4 K/uL (ref 1.7–7.7)
Neutrophils Relative %: 69 %
Platelets: 175 K/uL (ref 150–400)
RBC: 4.72 MIL/uL (ref 4.22–5.81)
RDW: 13.9 % (ref 11.5–15.5)
WBC: 10.7 K/uL — ABNORMAL HIGH (ref 4.0–10.5)
nRBC: 0 % (ref 0.0–0.2)

## 2023-11-30 LAB — BASIC METABOLIC PANEL WITH GFR
Anion gap: 18 — ABNORMAL HIGH (ref 5–15)
BUN: 6 mg/dL (ref 6–20)
CO2: 25 mmol/L (ref 22–32)
Calcium: 7.7 mg/dL — ABNORMAL LOW (ref 8.9–10.3)
Chloride: 97 mmol/L — ABNORMAL LOW (ref 98–111)
Creatinine, Ser: 0.82 mg/dL (ref 0.61–1.24)
GFR, Estimated: >60 mL/min (ref >60)
Glucose, Bld: 93 mg/dL (ref 70–99)
Potassium: 3.3 mmol/L — ABNORMAL LOW (ref 3.5–5.1)
Sodium: 140 mmol/L (ref 135–145)

## 2023-11-30 LAB — COMPREHENSIVE METABOLIC PANEL WITH GFR
ALT: 38 U/L (ref 0–44)
AST: 125 U/L — ABNORMAL HIGH (ref 15–41)
Albumin: 4 g/dL (ref 3.5–5.0)
Alkaline Phosphatase: 81 U/L (ref 38–126)
Anion gap: 24 — ABNORMAL HIGH (ref 5–15)
BUN: 9 mg/dL (ref 6–20)
CO2: 25 mmol/L (ref 22–32)
Calcium: 8.6 mg/dL — ABNORMAL LOW (ref 8.9–10.3)
Chloride: 92 mmol/L — ABNORMAL LOW (ref 98–111)
Creatinine, Ser: 1 mg/dL (ref 0.61–1.24)
GFR, Estimated: >60 mL/min (ref >60)
Glucose, Bld: 121 mg/dL — ABNORMAL HIGH (ref 70–99)
Potassium: 3.4 mmol/L — ABNORMAL LOW (ref 3.5–5.1)
Sodium: 141 mmol/L (ref 135–145)
Total Bilirubin: 1.6 mg/dL — ABNORMAL HIGH (ref 0.0–1.2)
Total Protein: 8 g/dL (ref 6.5–8.1)

## 2023-11-30 LAB — URINALYSIS, ROUTINE W REFLEX MICROSCOPIC
Bilirubin Urine: NEGATIVE
Glucose, UA: NEGATIVE mg/dL
Hgb urine dipstick: NEGATIVE
Ketones, ur: 20 mg/dL — AB
Leukocytes,Ua: NEGATIVE
Nitrite: NEGATIVE
Protein, ur: 30 mg/dL — AB
RBC / HPF: 0 RBC/hpf (ref 0–5)
Specific Gravity, Urine: 1.02 (ref 1.005–1.030)
Squamous Epithelial / HPF: 0 /HPF (ref 0–5)
pH: 5 (ref 5.0–8.0)

## 2023-11-30 LAB — ETHANOL: Alcohol, Ethyl (B): 307 mg/dL (ref <15)

## 2023-11-30 LAB — LIPASE, BLOOD: Lipase: 27 U/L (ref 11–51)

## 2023-11-30 MED ORDER — LACTATED RINGERS IV BOLUS
1000.0000 mL | Freq: Once | INTRAVENOUS | Status: AC
  Administered 2023-11-30: 1000 mL via INTRAVENOUS

## 2023-11-30 MED ORDER — SODIUM CHLORIDE 0.9 % IV BOLUS
1000.0000 mL | Freq: Once | INTRAVENOUS | Status: AC
  Administered 2023-11-30: 1000 mL via INTRAVENOUS

## 2023-11-30 MED ORDER — LORAZEPAM 2 MG/ML IJ SOLN
1.0000 mg | Freq: Once | INTRAMUSCULAR | Status: AC
  Administered 2023-11-30: 1 mg via INTRAVENOUS
  Filled 2023-11-30: qty 1

## 2023-11-30 MED ORDER — ONDANSETRON HCL 4 MG/2ML IJ SOLN
4.0000 mg | INTRAMUSCULAR | Status: AC
Start: 2023-11-30 — End: 2023-11-30
  Administered 2023-11-30: 4 mg via INTRAVENOUS
  Filled 2023-11-30: qty 2

## 2023-11-30 MED ORDER — ONDANSETRON HCL 4 MG/2ML IJ SOLN
4.0000 mg | Freq: Once | INTRAMUSCULAR | Status: AC
  Administered 2023-11-30: 4 mg via INTRAVENOUS
  Filled 2023-11-30: qty 2

## 2023-11-30 MED ORDER — SODIUM CHLORIDE 0.9 % IV BOLUS
1000.0000 mL | Freq: Once | INTRAVENOUS | Status: AC
Start: 2023-11-30 — End: 2023-11-30
  Administered 2023-11-30: 1000 mL via INTRAVENOUS

## 2023-11-30 MED ORDER — LORAZEPAM 2 MG/ML IJ SOLN
1.0000 mg | Freq: Once | INTRAMUSCULAR | Status: AC
Start: 2023-11-30 — End: 2023-11-30
  Administered 2023-11-30: 1 mg via INTRAVENOUS
  Filled 2023-11-30: qty 1

## 2023-11-30 MED ORDER — SODIUM CHLORIDE 0.9 % IV SOLN
12.5000 mg | Freq: Once | INTRAVENOUS | Status: AC
  Administered 2023-11-30: 12.5 mg via INTRAVENOUS
  Filled 2023-11-30: qty 12.5

## 2023-11-30 MED ORDER — PROMETHAZINE HCL 25 MG PO TABS: 25.0000 mg | ORAL_TABLET | Freq: Four times a day (QID) | ORAL | 0 refills | Status: AC | PRN

## 2023-11-30 NOTE — ED Notes (Signed)
 Ethanol critical at 307.

## 2023-11-30 NOTE — ED Notes (Signed)
 RN attempted to obtain UA sample using a urinal at bedside. Patient was unable to urinate at this time.

## 2023-11-30 NOTE — ED Triage Notes (Signed)
 Patient ambulatory to triage with unsteady gait, without difficulty or distress noted; pt reports having N/V since Tues; denies any abd pain; pt reports last ETOH was on Monday

## 2023-11-30 NOTE — Discharge Instructions (Addendum)
 Please take your nausea medication as prescribed as needed.  Please drink plenty fluids over the next several days.  If you are unable to tolerate fluids or you begin vomiting once again please return to the emergency department for further evaluation and IV hydration.  Please follow-up with your doctor in the next couple days for recheck.

## 2023-11-30 NOTE — ED Notes (Signed)
 Urinal at bedside.

## 2023-11-30 NOTE — ED Provider Notes (Signed)
 [facility] Provider Note    Event Date/Time   First MD Initiated Contact with Patient 11/30/23 0700     (approximate)  History   Chief Complaint: Emesis  HPI  Yossi Hinchman is a 53 y.o. male with a past medical history of alcohol abuse, hyperlipidemia, hypertension, presents to the emergency department for nausea and vomiting.  According to the patient he has been nauseated with frequent episodes of vomiting he has been unable to keep down much alcohol he states since Monday and feels like he is withdrawing as well.  Patient states this happens frequently.  Denies any abdominal pain.  No fever.  No diarrhea.  Patient actively vomiting in the emergency department.  Physical Exam   Triage Vital Signs: ED Triage Vitals  Encounter Vitals Group     BP 11/30/23 0618 (!) 150/95     Girls Systolic BP Percentile --      Girls Diastolic BP Percentile --      Boys Systolic BP Percentile --      Boys Diastolic BP Percentile --      Pulse Rate 11/30/23 0618 (!) 115     Resp --      Temp 11/30/23 0618 98.7 F (37.1 C)     Temp Source 11/30/23 0618 Oral     SpO2 11/30/23 0618 96 %     Weight 11/30/23 0614 170 lb (77.1 kg)     Height 11/30/23 0614 5' 9 (1.753 m)     Head Circumference --      Peak Flow --      Pain Score --      Pain Loc --      Pain Education --      Exclude from Growth Chart --     Most recent vital signs: Vitals:   11/30/23 0618 11/30/23 0710  BP: (!) 150/95 (!) 140/92  Pulse: (!) 115 (!) 113  Temp: 98.7 F (37.1 C)   SpO2: 96%     General: Awake, no distress.  CV:  Good peripheral perfusion.  Regular rate and rhythm  Resp:  Normal effort.  Equal breath sounds bilaterally.  Abd:  No distention.  Soft, nontender.   ED Results / Procedures / Treatments   MEDICATIONS ORDERED IN ED: Medications  LORazepam  (ATIVAN ) injection 1 mg (has no administration in time range)  ondansetron  (ZOFRAN ) injection 4 mg (has no  administration in time range)  sodium chloride  0.9 % bolus 1,000 mL (has no administration in time range)  ondansetron  (ZOFRAN ) injection 4 mg (4 mg Intravenous Given 11/30/23 0709)  lactated ringers  bolus 1,000 mL (1,000 mLs Intravenous New Bag/Given 11/30/23 0710)     IMPRESSION / MDM / ASSESSMENT AND PLAN / ED COURSE  I reviewed the triage vital signs and the nursing notes.  Patient's presentation is most consistent with acute presentation with potential threat to life or bodily function.  Patient presents to the emergency department for nausea and vomiting.  Patient states frequent alcohol use has not been able to drink much alcohol since Monday due to nausea and vomiting.  No abdominal pain no fever no other complaints.  Patient states this occurs fairly frequently.  We will dose Ativan  Zofran  IV fluids.  Will check labs and continue to closely monitor.  Patient agreeable to plan.  Lab work has resulted showing a reassuring CBC.  Patient's alcohol level significantly elevated at 307 and the patient's anion gap elevated 24 consistent with likely alcoholic ketoacidosis.  Patient has received Ativan  and Zofran  and fluids.  Repeat labs show significant improvement in the patient's chemistry has anion gap went from 24 down to 18.  Patient is now tolerating p.o. fluids.  I discussed with the patient admission to the hospital for nausea control and continued hydration.  Patient states he is feeling better and he wants to go home.  He states the last medication we used Phenergan  seem to work really well for him.  We will discharge with Phenergan .  I discussed with the patient if his nausea returns and he is unable to tolerate fluids he can return to the emergency department.  Patient agreeable to this plan.  I also discussed with the patient increasing oral hydration over the next 2 days.  FINAL CLINICAL IMPRESSION(S) / ED DIAGNOSES   Nausea and vomiting Dehydration  Note:  This document was  prepared using Dragon voice recognition software and may include unintentional dictation errors.   Dorothyann Drivers, MD 11/30/23 1434

## 2023-12-01 ENCOUNTER — Telehealth: Payer: Self-pay

## 2023-12-01 ENCOUNTER — Ambulatory Visit (INDEPENDENT_AMBULATORY_CARE_PROVIDER_SITE_OTHER): Payer: Self-pay | Admitting: Nurse Practitioner

## 2023-12-01 ENCOUNTER — Encounter: Payer: Self-pay | Admitting: Nurse Practitioner

## 2023-12-01 VITALS — BP 114/78 | HR 74 | Temp 99.0°F | Ht 69.0 in | Wt 159.6 lb

## 2023-12-01 DIAGNOSIS — F332 Major depressive disorder, recurrent severe without psychotic features: Secondary | ICD-10-CM

## 2023-12-01 DIAGNOSIS — G4700 Insomnia, unspecified: Secondary | ICD-10-CM

## 2023-12-01 DIAGNOSIS — R112 Nausea with vomiting, unspecified: Secondary | ICD-10-CM

## 2023-12-01 DIAGNOSIS — K7010 Alcoholic hepatitis without ascites: Secondary | ICD-10-CM

## 2023-12-01 MED ORDER — LORAZEPAM 0.5 MG PO TABS: 0.5000 mg | ORAL_TABLET | Freq: Every day | ORAL | 0 refills | Status: AC

## 2023-12-01 MED ORDER — SERTRALINE HCL 50 MG PO TABS: 50.0000 mg | ORAL_TABLET | Freq: Every day | ORAL | 1 refills | Status: AC

## 2023-12-01 NOTE — Telephone Encounter (Signed)
 Copied from CRM #[id number]. Topic: Clinical - Medication Question >> Dec 01, 2023 11:54 AM Thersia BROCKS wrote: Reason for RMF:Ejupzwu wife [name], called in regarding prescription. Stated she was under the impression that a prescription for the patient to sleep would be sent to the pharmacy, stated he only received the prescription sertraline  (ZOLOFT ) 50 MG tablet . Would like for someone to give her a callback to followup on this

## 2023-12-01 NOTE — Telephone Encounter (Signed)
 Please call and inform them the medication has been sent.

## 2023-12-01 NOTE — Progress Notes (Signed)
 Established Patient Office Visit  Subjective:  Patient ID: Levi Mejia, male    DOB: 03-04-1970  Age: 53 y.o. MRN: [id number]  CC:  Chief Complaint  Patient presents with   Establish Care   Discussed the use of a AI scribe software for clinical note transcription with the patient, who gave verbal consent to proceed.  HPI  Levi Mejia presents for transition of care following a recent [facility] visit for intoxication and vomiting. . His previous PCP was Dr. Hope. He is accompanied by his wife.  Alcohol dependence: He was recently hospitalized for extreme intoxication and vomiting, treated with Zofran  and Ativan , and discharged with Phenergan , which has not controlled his vomiting. He has not eaten properly in four days, managing only a bite of food due to esophageal issues. He consumes approximately one large bottle of alcohol daily, with a blood alcohol level of 307 at the [facility]. He previously attended a rehabilitation facility but relapsed.   Hypertension: Asymptomatic. Not taking losartan  at present.    Anxiety He experiences anxiety and difficulty sleeping, previously managed with Zoloft , which requires a refill.   Alcoholic hepatitis: Currently not under the care of a gastroenterologist.  Lab Results  Component Value Date   ALT 38 11/30/2023   AST 125 (H) 11/30/2023   ALKPHOS 81 11/30/2023   BILITOT 1.6 (H) 11/30/2023     He has a history of suicidal thoughts when intoxicated, with a recent hospitalization for threatening self-harm, but denies current suicidal ideation. No current recreational drug use or smoking.  History of TIA:  In 04/07/22 was admitted to [facility] with right-sided facial droop, dizziness left-sided weakness and numbness.  While on aspirin  therapy not taking aspirin  present. Has not seen neurology.   History of seizures: Denied any seizure activity. Chart review revealed admission to [facility] for seizure activity 12/20/2021 where  he was found to have right occipital lobe intraparenchymal hemorrhage. EtOH at that time.   HPI   Past Medical History:  Diagnosis Date   Alcohol abuse    Attention deficit disorder without mention of hyperactivity    Depression    HTN (hypertension)    Hyperlipidemia 07/03/2022   ICH (intracerebral hemorrhage) (HCC)    Seizures (HCC)    Stroke [facility])     Past Surgical History:  Procedure Laterality Date   ESOPHAGOGASTRODUODENOSCOPY (EGD) WITH PROPOFOL  N/A 07/04/2022   Procedure: ESOPHAGOGASTRODUODENOSCOPY (EGD) WITH PROPOFOL ;  Surgeon: Unk Corinn Skiff, MD;  Location: [facility] ENDOSCOPY;  Service: Endoscopy;  Laterality: N/A;   Hand tendon reattachment  02/28/1990   Right hand   toe reattachment  02/28/1994   Right 1st toe    Family History  Problem Relation Age of Onset   COPD Mother    Coronary artery disease Mother 61       Stent   Cancer Mother        Bone   Hypertension Mother    Early death Father    Heart failure Father    Heart attack Father 61   Hypertension Father    Alcohol abuse Father    Early death Sister    Drug abuse Sister    Depression Sister    Hyperlipidemia Brother    Hypertension Brother    Depression Daughter    Diabetes Neg Hx    Prostate cancer Neg Hx    Colon cancer Neg Hx    Heart disease Neg Hx     Social History   Socioeconomic History   Marital  status: Married    Spouse name: Not on file   Number of children: 1   Years of education: Not on file   Highest education level: 12th grade  Occupational History   Occupation: Curator    Comment: Engineering geologist  Tobacco Use   Smoking status: Never   Smokeless tobacco: Never  Vaping Use   Vaping status: Former  Substance and Sexual Activity   Alcohol use: Yes    Comment: 1 bottle a day   Drug use: Not Currently    Types: Marijuana   Sexual activity: Not Currently  Other Topics Concern   Not on file  Social History Narrative   Married      1 daughter      Right handed    Social Drivers of Health   Financial Resource Strain: Low Risk  (03/22/2023)   Received from [facility]   Overall Financial Resource Strain (CARDIA)    Difficulty of Paying Living Expenses: Not very hard  Food Insecurity: No Food Insecurity (10/12/2023)   Hunger Vital Sign    Worried About Running Out of Food in the Last Year: Never true    Ran Out of Food in the Last Year: Never true  Transportation Needs: No Transportation Needs (10/12/2023)   PRAPARE - Administrator, Civil Service (Medical): No    Lack of Transportation (Non-Medical): No  Physical Activity: Sufficiently Active (08/08/2022)   Exercise Vital Sign    Days of Exercise per Week: 5 days    Minutes of Exercise per Session: 40 min  Recent Concern: Physical Activity - Inactive (07/14/2022)   Exercise Vital Sign    Days of Exercise per Week: 0 days    Minutes of Exercise per Session: 0 min  Stress: No Stress Concern Present (08/08/2022)   [employer] of Occupational Health - Occupational Stress Questionnaire    Feeling of Stress : Not at all  Social Connections: Moderately Integrated (10/12/2023)   Social Connection and Isolation Panel    Frequency of Communication with Friends and Family: Once a week    Frequency of Social Gatherings with Friends and Family: Once a week    Attends Religious Services: More than 4 times per year    Active Member of [employer] or Organizations: No    Attends Engineer, structural: More than 4 times per year    Marital Status: Married  Catering manager Violence: Not At Risk (10/12/2023)   Humiliation, Afraid, Rape, and Kick questionnaire    Fear of Current or Ex-Partner: No    Emotionally Abused: No    Physically Abused: No    Sexually Abused: No     Outpatient Medications Prior to Visit  Medication Sig Dispense Refill   Multiple Vitamin (MULTIVITAMIN WITH MINERALS) TABS tablet Take 1 tablet by mouth daily. 30 tablet 0   promethazine  (PHENERGAN ) 25 MG tablet  Take 1 tablet (25 mg total) by mouth every 6 (six) hours as needed for nausea or vomiting. 20 tablet 0   sertraline  (ZOLOFT ) 50 MG tablet Take 1 tablet (50 mg total) by mouth daily. 30 tablet 0   folic acid  (FOLVITE ) 1 MG tablet Take 1 tablet (1 mg total) by mouth daily. (Patient not taking: Reported on 12/01/2023) 30 tablet 0   naltrexone (DEPADE) 50 MG tablet Take 50 mg by mouth daily. (Patient not taking: Reported on 12/01/2023)     thiamine  (VITAMIN B1) 100 MG tablet Take 1 tablet (100 mg total) by mouth  daily. (Patient not taking: Reported on 12/01/2023) 30 tablet 0   No facility-administered medications prior to visit.    No Known Allergies  ROS Review of Systems Negative unless indicated in HPI.    Objective:    Physical Exam  BP 114/78   Pulse 74   Temp 99 F (37.2 C)   Ht 5' 9 (1.753 m)   Wt 159 lb 9.6 oz (72.4 kg)   SpO2 97%   BMI 23.57 kg/m  Wt Readings from Last 3 Encounters:  12/01/23 159 lb 9.6 oz (72.4 kg)  11/30/23 170 lb (77.1 kg)  10/10/23 162 lb 4.1 oz (73.6 kg)     Health Maintenance  Topic Date Due   Hepatitis B Vaccines 19-59 Average Risk (1 of 3 - 19+ 3-dose series) Never done   Fecal DNA (Cologuard)  Never done   DTaP/Tdap/Td (3 - Td or Tdap) 06/16/2020   Pneumococcal Vaccine: 50+ Years (1 of 1 - PCV) Never done   Zoster Vaccines- Shingrix (1 of 2) Never done   COVID-19 Vaccine (3 - 2025-26 season) 10/30/2023   Influenza Vaccine  05/28/2024 (Originally 09/29/2023)   Hepatitis C Screening  Completed   HIV Screening  Completed   HPV VACCINES  Aged Out   Meningococcal B Vaccine  Aged Out       Topic Date Due   Hepatitis B Vaccines 19-59 Average Risk (1 of 3 - 19+ 3-dose series) Never done    Lab Results  Component Value Date   TSH 1.13 06/23/2022   Lab Results  Component Value Date   WBC 10.7 (H) 11/30/2023   HGB 15.8 11/30/2023   HCT 44.4 11/30/2023   MCV 94.1 11/30/2023   PLT 175 11/30/2023   Lab Results  Component Value Date    NA 140 11/30/2023   K 3.3 (L) 11/30/2023   CO2 25 11/30/2023   GLUCOSE 93 11/30/2023   BUN 6 11/30/2023   CREATININE 0.82 11/30/2023   BILITOT 1.6 (H) 11/30/2023   ALKPHOS 81 11/30/2023   AST 125 (H) 11/30/2023   ALT 38 11/30/2023   PROT 8.0 11/30/2023   ALBUMIN 4.0 11/30/2023   CALCIUM  7.7 (L) 11/30/2023   ANIONGAP 18 (H) 11/30/2023   GFR 99.61 08/08/2022   Lab Results  Component Value Date   CHOL 228 (H) 06/23/2022   Lab Results  Component Value Date   HDL 95.50 06/23/2022   Lab Results  Component Value Date   LDLCALC 118 (H) 06/23/2022   Lab Results  Component Value Date   TRIG 74.0 06/23/2022   Lab Results  Component Value Date   CHOLHDL 2 06/23/2022   Lab Results  Component Value Date   HGBA1C 5.1 06/23/2022      Assessment & Plan:  Alcoholic hepatitis without ascites (HCC) Assessment & Plan: Chronic alcohol use disorder with recent hospitalization for intoxication. No current GI follow-up. Previous rehab attempt failed due to relapse. Financial constraints limit inpatient treatment. - Refer to GI specialist for alcoholic hepatitis management. - Recommend AA meetings. - Discuss outpatient alcohol cessation options. - Encourage reduction of alcohol intake.  Orders: -     Ambulatory referral to Gastroenterology  Nausea and vomiting, unspecified vomiting type Assessment & Plan: Persistent nausea and vomiting likely exacerbated by alcohol use. Phenergan  and Zofran  ineffective. - Encourage oral intake of clear liquids. - Advise ER return if unable to maintain hydration or symptoms worsen.    MDD (major depressive disorder), recurrent severe, without psychosis (HCC) Assessment &  Plan: Depression. Previous hospitalization for suicidal ideation while intoxicated. Currently not suicidal. Requires Zoloft  refill. - Refill Zoloft  50 mg, 90 days supply with one refill. - Refer to psychiatrist for ongoing depression management.  Orders: -     Ambulatory  referral to Psychiatry  Insomnia, unspecified type Assessment & Plan: Chronic insomnia with difficulty initiating sleep due to racing thoughts. Previous trazodone  trials ineffective. Reports Adderall helps calm mind. - Refer to sleep specialist for further evaluation. - Prescribe lorazepam  for short-term insomnia management.  Orders: -     Pulmonary Visit  Other orders -     Sertraline  HCl; Take 1 tablet (50 mg total) by mouth daily.  Dispense: 90 tablet; Refill: 1 -     LORazepam ; Take 1 tablet (0.5 mg total) by mouth at bedtime.  Dispense: 30 tablet; Refill: 0    Follow-up: Return in about 1 month (around 01/01/2024) for chronic management.   Annette Bertelson, NP

## 2023-12-01 NOTE — Telephone Encounter (Signed)
 Called and let Patient know that 2 medications was called in for him today.

## 2023-12-01 NOTE — Telephone Encounter (Signed)
 Was we supposed to call in a sleep medication?

## 2023-12-18 DIAGNOSIS — G4700 Insomnia, unspecified: Secondary | ICD-10-CM | POA: Insufficient documentation

## 2023-12-18 NOTE — Assessment & Plan Note (Signed)
 Depression. Previous hospitalization for suicidal ideation while intoxicated. Currently not suicidal. Requires Zoloft  refill. - Refill Zoloft  50 mg, 90 days supply with one refill. - Refer to psychiatrist for ongoing depression management.

## 2023-12-18 NOTE — Assessment & Plan Note (Signed)
 Chronic insomnia with difficulty initiating sleep due to racing thoughts. Previous trazodone  trials ineffective. Reports Adderall helps calm mind. - Refer to sleep specialist for further evaluation. - Prescribe lorazepam  for short-term insomnia management.

## 2023-12-18 NOTE — Assessment & Plan Note (Signed)
 Chronic alcohol use disorder with recent hospitalization for intoxication. No current GI follow-up. Previous rehab attempt failed due to relapse. Financial constraints limit inpatient treatment. - Refer to GI specialist for alcoholic hepatitis management. - Recommend AA meetings. - Discuss outpatient alcohol cessation options. - Encourage reduction of alcohol intake.

## 2023-12-18 NOTE — Assessment & Plan Note (Signed)
 Persistent nausea and vomiting likely exacerbated by alcohol use. Phenergan  and Zofran  ineffective. - Encourage oral intake of clear liquids. - Advise ER return if unable to maintain hydration or symptoms worsen.

## 2024-01-05 ENCOUNTER — Ambulatory Visit: Payer: Self-pay | Admitting: Nurse Practitioner

## 2024-01-12 ENCOUNTER — Ambulatory Visit: Payer: Self-pay | Admitting: Sleep Medicine

## 2024-02-01 ENCOUNTER — Ambulatory Visit: Payer: Self-pay | Admitting: Sleep Medicine

## 2024-03-31 DEATH — deceased
